# Patient Record
Sex: Female | Born: 1950 | Race: Black or African American | Hispanic: No | Smoking: Never smoker
Health system: Southern US, Community
[De-identification: ages and names within clinical notes are randomized; demographics above are authoritative.]

## PROBLEM LIST (undated history)

## (undated) DIAGNOSIS — I10 Essential (primary) hypertension: Secondary | ICD-10-CM

## (undated) DIAGNOSIS — E7800 Pure hypercholesterolemia, unspecified: Secondary | ICD-10-CM

## (undated) DIAGNOSIS — E119 Type 2 diabetes mellitus without complications: Secondary | ICD-10-CM

## (undated) DIAGNOSIS — M1990 Unspecified osteoarthritis, unspecified site: Secondary | ICD-10-CM

## (undated) DIAGNOSIS — G4730 Sleep apnea, unspecified: Secondary | ICD-10-CM

## (undated) DIAGNOSIS — F32A Depression, unspecified: Secondary | ICD-10-CM

## (undated) DIAGNOSIS — Z9989 Dependence on other enabling machines and devices: Secondary | ICD-10-CM

## (undated) HISTORY — PX: COLONOSCOPY: SHX174

## (undated) HISTORY — PX: ABDOMINAL HYSTERECTOMY: SHX81

## (undated) HISTORY — PX: EYE SURGERY: SHX253

---

## 2018-05-17 ENCOUNTER — Emergency Department (HOSPITAL_COMMUNITY)
Admission: EM | Admit: 2018-05-17 | Discharge: 2018-05-18 | Disposition: A | Discharge status: Home / Self Care | Payer: Medicare HMO | Attending: Emergency Medicine | Admitting: Emergency Medicine

## 2018-05-17 ENCOUNTER — Encounter (HOSPITAL_COMMUNITY): Payer: Self-pay | Admitting: Emergency Medicine

## 2018-05-17 ENCOUNTER — Other Ambulatory Visit: Payer: Self-pay

## 2018-05-17 DIAGNOSIS — M4302 Spondylolysis, cervical region: Secondary | ICD-10-CM | POA: Diagnosis not present

## 2018-05-17 DIAGNOSIS — M5412 Radiculopathy, cervical region: Secondary | ICD-10-CM | POA: Insufficient documentation

## 2018-05-17 DIAGNOSIS — M542 Cervicalgia: Secondary | ICD-10-CM | POA: Diagnosis present

## 2018-05-17 DIAGNOSIS — E119 Type 2 diabetes mellitus without complications: Secondary | ICD-10-CM | POA: Diagnosis not present

## 2018-05-17 DIAGNOSIS — I10 Essential (primary) hypertension: Secondary | ICD-10-CM | POA: Insufficient documentation

## 2018-05-17 HISTORY — DX: Type 2 diabetes mellitus without complications: E11.9

## 2018-05-17 HISTORY — DX: Pure hypercholesterolemia, unspecified: E78.00

## 2018-05-17 HISTORY — DX: Essential (primary) hypertension: I10

## 2018-05-17 NOTE — ED Triage Notes (Signed)
Pt c/o pain in right shoulder and arm pain x's 2 weeks.  Pt has been seen at a Urgent Care for same and given muscle relaxer for same.  Pt st's it's not helping the pain

## 2018-05-18 LAB — CBG MONITORING, ED: Glucose-Capillary: 267 mg/dL — ABNORMAL HIGH (ref 70–99)

## 2018-05-18 MED ORDER — NAPROXEN 375 MG PO TABS: 375.0000 mg | ORAL_TABLET | Freq: Two times a day (BID) | ORAL | 0 refills | Status: DC

## 2018-05-18 MED ORDER — KETOROLAC TROMETHAMINE 60 MG/2ML IM SOLN: 60.0000 mg | Freq: Once | INTRAMUSCULAR | Status: DC

## 2018-05-18 MED ORDER — HYDROCODONE-ACETAMINOPHEN 5-325 MG PO TABS: 1.0000 | ORAL_TABLET | Freq: Four times a day (QID) | ORAL | 0 refills | Status: DC | PRN

## 2018-05-18 MED ORDER — NAPROXEN 250 MG PO TABS: 250.0000 mg | ORAL_TABLET | Freq: Two times a day (BID) | ORAL | 0 refills | Status: AC

## 2018-05-18 MED ORDER — DEXAMETHASONE 4 MG PO TABS
10.0000 mg | ORAL_TABLET | Freq: Once | ORAL | Status: AC
  Administered 2018-05-18: 10 mg via ORAL
  Filled 2018-05-18: qty 3

## 2018-05-18 MED ORDER — HYDROCODONE-ACETAMINOPHEN 5-325 MG PO TABS
2.0000 | ORAL_TABLET | Freq: Once | ORAL | Status: AC
  Administered 2018-05-18: 2 via ORAL
  Filled 2018-05-18: qty 2

## 2018-05-18 MED ORDER — KETOROLAC TROMETHAMINE 60 MG/2ML IM SOLN
30.0000 mg | Freq: Once | INTRAMUSCULAR | Status: AC
  Administered 2018-05-18: 30 mg via INTRAMUSCULAR
  Filled 2018-05-18: qty 2

## 2018-05-18 NOTE — ED Provider Notes (Signed)
[facility] EMERGENCY DEPARTMENT Provider Note   CSN: [id number] Arrival date & time: 05/17/18  2323    History   Chief Complaint Chief Complaint  Patient presents with  . Shoulder Pain    HPI Carla Stokes is a 68 y.o. female.     HPI 68 year old female with history of hypertension, high cholesterol, diabetes, here with shoulder pain and neck pain.  The patient states that over the last several days, she has had progressively worsening aching, throbbing, right paraspinal neck pain.  This pain is worse with any kind of movement and occasionally shoots towards her shoulder and upper arm.  She had plain films yesterday which showed cervical spondylolysis with facet hypertrophy.  She has been taking a muscle relaxant without any relief.  She describes the pain is a sharp, stabbing pain that is worse when she looks to the right.  She denies any numbness or weakness her arms or legs.  No associated chest pain or shortness of breath.  Denies any overt injuries, but has been watching someone's house and dogs, and lifting and moving more than usual.  No alleviating factors.  No other complaints.  Past Medical History:  Diagnosis Date  . Diabetes mellitus without complication (HCC)   . High cholesterol   . Hypertension     There are no active problems to display for this patient.   Past Surgical History:  Procedure Laterality Date  . ABDOMINAL HYSTERECTOMY       OB History   No obstetric history on file.      Home Medications    Prior to Admission medications   Medication Sig Start Date End Date Taking? Authorizing Provider  HYDROcodone-acetaminophen (NORCO/VICODIN) 5-325 MG tablet Take 1-2 tablets by mouth every 6 (six) hours as needed for moderate pain or severe pain. 05/18/18   Shaune Pollack, MD  naproxen (NAPROSYN) 250 MG tablet Take 1 tablet (250 mg total) by mouth 2 (two) times daily with a meal for 7 days. 05/18/18 05/25/18  Shaune Pollack, MD    Family History No family history on file.  Social History Social History   Tobacco Use  . Smoking status: Never Smoker  . Smokeless tobacco: Never Used  Substance Use Topics  . Alcohol use: Not Currently  . Drug use: Never     Allergies   Patient has no allergy information on record.   Review of Systems Review of Systems  Constitutional: Negative for chills and fever.  HENT: Negative for congestion, rhinorrhea and sore throat.   Eyes: Negative for visual disturbance.  Respiratory: Negative for cough, shortness of breath and wheezing.   Cardiovascular: Negative for chest pain and leg swelling.  Gastrointestinal: Negative for abdominal pain, diarrhea, nausea and vomiting.  Genitourinary: Negative for dysuria, flank pain, vaginal bleeding and vaginal discharge.  Musculoskeletal: Positive for arthralgias and neck pain.  Skin: Negative for rash.  Allergic/Immunologic: Negative for immunocompromised state.  Neurological: Negative for syncope and headaches.  Hematological: Does not bruise/bleed easily.  All other systems reviewed and are negative.    Physical Exam Updated Vital Signs BP (!) 157/72 (BP Location: Left Arm)   Pulse 64   Temp (!) 97.2 F (36.2 C) (Oral)   Resp 20   Ht 5\' 8"  (1.727 m)   Wt 103.4 kg   LMP  (Exact Date)   SpO2 98%   BMI 34.67 kg/m   Physical Exam Vitals signs and nursing note reviewed.  Constitutional:      General:  She is not in acute distress.    Appearance: She is well-developed.  HENT:     Head: Normocephalic and atraumatic.  Eyes:     Conjunctiva/sclera: Conjunctivae normal.  Neck:     Musculoskeletal: Neck supple.     Comments: Significant right paraspinal tenderness to palpation, with tenderness upon right sided Spurling test.  This reproduces pain down her shoulder.  No overt bony tenderness over the upper arm, shoulder, or clavicle.  Minimal midline tenderness. Cardiovascular:     Rate and Rhythm: Normal rate and regular  rhythm.     Heart sounds: Normal heart sounds. No murmur. No friction rub.  Pulmonary:     Effort: Pulmonary effort is normal. No respiratory distress.     Breath sounds: Normal breath sounds. No wheezing or rales.  Abdominal:     General: There is no distension.     Palpations: Abdomen is soft.     Tenderness: There is no abdominal tenderness.  Skin:    General: Skin is warm.     Capillary Refill: Capillary refill takes less than 2 seconds.  Neurological:     Mental Status: She is alert and oriented to person, place, and time.     Motor: No abnormal muscle tone.     Comments: Strength is 5 out of 5 bilateral upper and lower extremities.  Normal sensation to light touch bilateral upper and lower extremities.  Normal brachial and brachioradialis reflexes.      ED Treatments / Results  Labs (all labs ordered are listed, but only abnormal results are displayed) Labs Reviewed  CBG MONITORING, ED - Abnormal; Notable for the following components:      Result Value   Glucose-Capillary 267 (*)    All other components within normal limits    EKG EKG Interpretation  Date/Time:  Thursday May 17 2018 23:36:32 EDT Ventricular Rate:  56 PR Interval:  150 QRS Duration: 98 QT Interval:  436 QTC Calculation: 420 R Axis:   -22 Text Interpretation:  Sinus bradycardia Non-specific ST-t changes Baseline wander Abnormal ECG Confirmed by Shaune Pollack [phone]) on 05/18/2018 4:33:00 AM   Radiology No results found.  Procedures Procedures (including critical care time)  Medications Ordered in ED Medications  HYDROcodone-acetaminophen (NORCO/VICODIN) 5-325 MG per tablet 2 tablet (2 tablets Oral Given 05/18/18 0347)  dexamethasone (DECADRON) tablet 10 mg (10 mg Oral Given 05/18/18 0347)  ketorolac (TORADOL) injection 30 mg (30 mg Intramuscular Given 05/18/18 0358)     Initial Impression / Assessment and Plan / ED Course  I have reviewed the triage vital signs and the nursing notes.   Pertinent labs & imaging results that were available during my care of the patient were reviewed by me and considered in my medical decision making (see chart for details).        68 year old female here with right paraspinal neck pain shooting towards her shoulder.  This is completely reproducible on examination.  She has no upper extremity numbness, weakness, or symptoms to suggest cord compression or acute neurological compromise.  Plain films from recent visit reviewed in paper, and show spondylolysis with degenerative changes and foraminal stenosis in C4-C6 distribution, which is consistent with her radicular symptoms.  She adamantly denies any anterior chest pain, shortness of breath, and EKG, while it does show nonspecific T wave changes, appears unchanged from multiple previous explained EKGs and review of care everywhere.  She has resolution of symptoms with rest and analgesics.  I suspect symptoms are due to cervical  radiculopathy in the setting of increased physical activity.  Will give a single dose of steroid, as well as course of analgesics and anti-inflammatories.  Will have her hold Robaxin.  Final Clinical Impressions(s) / ED Diagnoses   Final diagnoses:  Cervical radiculopathy  Cervical spondylolysis    ED Discharge Orders         Ordered    HYDROcodone-acetaminophen (NORCO/VICODIN) 5-325 MG tablet  Every 6 hours PRN,   Status:  Discontinued     05/18/18 0435    naproxen (NAPROSYN) 375 MG tablet  2 times daily with meals,   Status:  Discontinued     05/18/18 0435    naproxen (NAPROSYN) 250 MG tablet  2 times daily with meals     05/18/18 0438    HYDROcodone-acetaminophen (NORCO/VICODIN) 5-325 MG tablet  Every 6 hours PRN     05/18/18 0438           Shaune Pollack, MD 05/18/18 [phone]

## 2018-05-18 NOTE — Discharge Instructions (Signed)
I would recommend stopping the methocarbamol, as this may not improve your symptoms and could sedate you.  Take the prescribed medications.  I recommend discussing with your primary as you may benefit from referral to a spine specialist in the next 1 to 2 weeks if symptoms not improve.  No heavy lifting over 10 pounds for the next week.

## 2018-05-18 NOTE — ED Notes (Signed)
EDP at bedside  

## 2021-04-01 ENCOUNTER — Emergency Department (HOSPITAL_COMMUNITY): Payer: Medicare HMO

## 2021-04-01 ENCOUNTER — Emergency Department (HOSPITAL_COMMUNITY)
Admission: EM | Admit: 2021-04-01 | Discharge: 2021-04-01 | Disposition: A | Discharge status: Home / Self Care | Payer: Medicare HMO | Attending: Emergency Medicine | Admitting: Emergency Medicine

## 2021-04-01 ENCOUNTER — Encounter (HOSPITAL_COMMUNITY): Payer: Self-pay

## 2021-04-01 ENCOUNTER — Other Ambulatory Visit: Payer: Self-pay

## 2021-04-01 DIAGNOSIS — S8992XA Unspecified injury of left lower leg, initial encounter: Secondary | ICD-10-CM | POA: Diagnosis present

## 2021-04-01 DIAGNOSIS — I10 Essential (primary) hypertension: Secondary | ICD-10-CM | POA: Diagnosis not present

## 2021-04-01 DIAGNOSIS — E119 Type 2 diabetes mellitus without complications: Secondary | ICD-10-CM | POA: Diagnosis not present

## 2021-04-01 DIAGNOSIS — X58XXXA Exposure to other specified factors, initial encounter: Secondary | ICD-10-CM | POA: Diagnosis not present

## 2021-04-01 DIAGNOSIS — R030 Elevated blood-pressure reading, without diagnosis of hypertension: Secondary | ICD-10-CM | POA: Diagnosis not present

## 2021-04-01 DIAGNOSIS — S81802A Unspecified open wound, left lower leg, initial encounter: Secondary | ICD-10-CM

## 2021-04-01 LAB — COMPREHENSIVE METABOLIC PANEL
ALT: 47 U/L — ABNORMAL HIGH (ref 0–44)
AST: 49 U/L — ABNORMAL HIGH (ref 15–41)
Albumin: 3.9 g/dL (ref 3.5–5.0)
Alkaline Phosphatase: 54 U/L (ref 38–126)
Anion gap: 10 (ref 5–15)
BUN: 10 mg/dL (ref 8–23)
CO2: 27 mmol/L (ref 22–32)
Calcium: 9.6 mg/dL (ref 8.9–10.3)
Chloride: 105 mmol/L (ref 98–111)
Creatinine, Ser: 0.86 mg/dL (ref 0.44–1.00)
GFR, Estimated: >60 mL/min (ref >60)
Glucose, Bld: 157 mg/dL — ABNORMAL HIGH (ref 70–99)
Potassium: 3.6 mmol/L (ref 3.5–5.1)
Sodium: 142 mmol/L (ref 135–145)
Total Bilirubin: 0.6 mg/dL (ref 0.3–1.2)
Total Protein: 7 g/dL (ref 6.5–8.1)

## 2021-04-01 LAB — CBG MONITORING, ED: Glucose-Capillary: 137 mg/dL — ABNORMAL HIGH (ref 70–99)

## 2021-04-01 LAB — CBC WITH DIFFERENTIAL/PLATELET
Abs Immature Granulocytes: 0.03 10*3/uL (ref 0.00–0.07)
Basophils Absolute: 0 10*3/uL (ref 0.0–0.1)
Basophils Relative: 1 %
Eosinophils Absolute: 0.1 10*3/uL (ref 0.0–0.5)
Eosinophils Relative: 1 %
HCT: 41.3 % (ref 36.0–46.0)
Hemoglobin: 14 g/dL (ref 12.0–15.0)
Immature Granulocytes: 1 %
Lymphocytes Relative: 21 %
Lymphs Abs: 1.3 10*3/uL (ref 0.7–4.0)
MCH: 26.2 pg (ref 26.0–34.0)
MCHC: 33.9 g/dL (ref 30.0–36.0)
MCV: 77.3 fL — ABNORMAL LOW (ref 80.0–100.0)
Monocytes Absolute: 0.4 10*3/uL (ref 0.1–1.0)
Monocytes Relative: 6 %
Neutro Abs: 4.4 10*3/uL (ref 1.7–7.7)
Neutrophils Relative %: 70 %
Platelets: 207 10*3/uL (ref 150–400)
RBC: 5.34 MIL/uL — ABNORMAL HIGH (ref 3.87–5.11)
RDW: 14 % (ref 11.5–15.5)
WBC: 6.2 10*3/uL (ref 4.0–10.5)
nRBC: 0 % (ref 0.0–0.2)

## 2021-04-01 MED ORDER — DOXYCYCLINE HYCLATE 100 MG PO CAPS: 100.0000 mg | ORAL_CAPSULE | Freq: Two times a day (BID) | ORAL | 0 refills | Status: AC

## 2021-04-01 MED ORDER — DOXYCYCLINE HYCLATE 100 MG PO TABS
100.0000 mg | ORAL_TABLET | Freq: Once | ORAL | Status: AC
  Administered 2021-04-01: 100 mg via ORAL
  Filled 2021-04-01: qty 1

## 2021-04-01 NOTE — Discharge Instructions (Addendum)
At this time there does not appear to be the presence of an emergent medical condition, however there is always the potential for conditions to change. Please read and follow the below instructions.  Please return to the Emergency Department immediately for any new or worsening symptoms. Please be sure to follow up with your Primary Care Provider within one week regarding your visit today; please call their office to schedule an appointment even if you are feeling better for a follow-up visit. Please go to your appointment at the wound care center for further evaluation and treatment of your left leg wound.  Please continue good wound care as discussed by your podiatrist, change her dressing every 2 days. Please take your antibiotic doxycycline as prescribed until complete to help with your symptoms.  Please drink enough water to avoid dehydration and get plenty of rest. Your blood pressure was elevated in the emergency department today.  Please be sure to take your blood pressure medication today, please have it rechecked by your primary care provider at your follow-up visit this week to ensure its improvement  Go to the nearest Emergency Department immediately if: You have fever or chills Get a bad headache. Start to feel mixed up (confused). Feel weak or numb. Feel faint. Have very bad pain in your: Chest. Belly (abdomen). Throw up more than once. Have trouble breathing. Your pain is not controlled with medicine. You have any of these signs of infection: More redness, swelling, or pain around the wound. Fluid or blood coming from the wound. Warmth coming from the wound. A fever or chills. You are nauseous or you vomit. You are dizzy. You have a new rash or hardness around the wound. You have any new/concerning or worsening of symptoms.   Please read the additional information packets attached to your discharge summary.  Do not take your medicine if  develop an itchy rash, swelling  in your mouth or lips, or difficulty breathing; call 911 and seek immediate emergency medical attention if this occurs.  You may review your lab tests and imaging results in their entirety on your MyChart account.  Please discuss all results of fully with your primary care provider and other specialist at your follow-up visit.  Note: Portions of this text may have been transcribed using voice recognition software. Every effort was made to ensure accuracy; however, inadvertent computerized transcription errors may still be present.

## 2021-04-01 NOTE — ED Provider Triage Note (Addendum)
Emergency Medicine Provider Triage Evaluation Note  Carla Stokes , a 71 y.o. female with a history of diabetes mellitus type 2 was evaluated in triage.  Pt complains of on the left lateral ankle for the last 4 to 5 weeks.  Has history of diabetes.  Wound is painful.  Has tried courses of 2 different antibiotics without success, but cannot remember their names.  Denies fever.  Review of Systems  Positive: Wound of left ankle Negative: Fever  Physical Exam  BP (!) 184/90 (BP Location: Left Arm)    Pulse 65    Temp 99.1 F (37.3 C) (Oral)    Resp 18    Ht 5\' 7"  (1.702 m)    Wt 105.7 kg    SpO2 100%    BMI 36.49 kg/m  Gen:   Awake, no distress   Resp:  Normal effort  MSK:   Moves extremities without difficulty  Other:  Ulceration of left lateral lower extremity proximal to the malleolus.  Dressing present.  Weeping through dressing.  Extreme TTP of area + ascending mild TTP  Medical Decision Making  Medically screening exam initiated at 11:36 AM.  Appropriate orders placed.  was informed that the remainder of the evaluation will be completed by another provider, this initial triage assessment does not replace that evaluation, and the importance of remaining in the ED until their evaluation is complete.  Imaging and labs   Micheal Likens 04/01/21 1152    05/30/21, Cecil Cobbs 04/01/21 1153

## 2021-04-01 NOTE — ED Triage Notes (Signed)
Pt arrived POV from home c/o a wound on her left ankle. Pt states she is a diabetic and she has been dealing with it for 4-5 weeks. She has been on 2 antibiotics and it is only getting worse. Wound dr told her to come over.

## 2021-04-01 NOTE — ED Provider Notes (Signed)
[facility] EMERGENCY DEPARTMENT Provider Note   CSN: [id number] Arrival date & time: 04/01/21  1058     History  Chief Complaint  Patient presents with   Leg Pain    Carla Stokes is a 71 y.o. female history includes diabetes, hypertension, hyperlipidemia.  Patient presents to the ED today for evaluation of left lower leg wound, patient has been dealing with this wound for several weeks.  Patient reports that she has been treated by a podiatrist for this and has had 2 rounds of antibiotics, she reports that they have not helped and that podiatry sent her to [facility] for further treatment and wound care.  Patient reports a mild aching pain to the left lower leg worsened with palpation, improves somewhat with rest, patient denies radiation of pain.  Patient denies fall/injury, fever, chills, numbness/tingling, headache, chest pain, abdominal pain or any additional concerns.  HPI     Home Medications Prior to Admission medications   Medication Sig Start Date End Date Taking? Authorizing Provider  doxycycline (VIBRAMYCIN) 100 MG capsule Take 1 capsule (100 mg total) by mouth 2 (two) times daily for 10 days. 04/01/21 04/11/21 Yes Harlene Salts A, PA-C  HYDROcodone-acetaminophen (NORCO/VICODIN) 5-325 MG tablet Take 1-2 tablets by mouth every 6 (six) hours as needed for moderate pain or severe pain. 05/18/18   Shaune Pollack, MD      Allergies    Patient has no known allergies.    Review of Systems   Review of Systems Ten systems are reviewed and are negative for acute change except as noted in the HPI  Physical Exam Updated Vital Signs BP (!) 184/90 (BP Location: Left Arm)    Pulse 65    Temp 99.1 F (37.3 C) (Oral)    Resp 18    Ht 5\' 7"  (1.702 m)    Wt 105.7 kg    SpO2 100%    BMI 36.49 kg/m  Physical Exam Constitutional:      General: She is not in acute distress.    Appearance: Normal appearance. She is well-developed. She is not ill-appearing or  diaphoretic.  HENT:     Head: Normocephalic and atraumatic.  Eyes:     General: Vision grossly intact. Gaze aligned appropriately.     Pupils: Pupils are equal, round, and reactive to light.  Neck:     Trachea: Trachea and phonation normal.  Cardiovascular:     Pulses:          Dorsalis pedis pulses are 1+ on the right side and 1+ on the left side.  Pulmonary:     Effort: Pulmonary effort is normal. No respiratory distress.  Abdominal:     General: There is no distension.     Palpations: Abdomen is soft.     Tenderness: There is no abdominal tenderness. There is no guarding or rebound.  Musculoskeletal:        General: Normal range of motion.     Cervical back: Normal range of motion.  Skin:    General: Skin is warm and dry.     Comments: 1 cm area of ulceration to the lateral side of the left lower leg with mild surrounding erythema, scant serosanguineous drainage.  No streaking, no fluctuance.  Please see picture below.  Neurological:     Mental Status: She is alert.     GCS: GCS eye subscore is 4. GCS verbal subscore is 5. GCS motor subscore is 6.     Comments:  Speech is clear and goal oriented, follows commands Major Cranial nerves without deficit, no facial droop Moves extremities without ataxia, coordination intact  Psychiatric:        Behavior: Behavior normal.     ED Results / Procedures / Treatments   Labs (all labs ordered are listed, but only abnormal results are displayed) Labs Reviewed  COMPREHENSIVE METABOLIC PANEL - Abnormal; Notable for the following components:      Result Value   Glucose, Bld 157 (*)    AST 49 (*)    ALT 47 (*)    All other components within normal limits  CBC WITH DIFFERENTIAL/PLATELET - Abnormal; Notable for the following components:   RBC 5.34 (*)    MCV 77.3 (*)    All other components within normal limits  CBG MONITORING, ED - Abnormal; Notable for the following components:   Glucose-Capillary 137 (*)    All other components  within normal limits  CULTURE, BLOOD (ROUTINE X 2)  CULTURE, BLOOD (ROUTINE X 2)    EKG None  Radiology DG Tibia/Fibula Left  Result Date: 04/01/2021 CLINICAL DATA:  Possible osteomyelitis. EXAM: LEFT TIBIA AND FIBULA - 2 VIEW COMPARISON:  November 19, 2014. FINDINGS: There is no evidence of fracture or other focal bone lesions. Vascular calcifications are noted. IMPRESSION: No definite bony abnormality is noted. Electronically Signed   By: Lupita Raider M.D.   On: 04/01/2021 12:20    Procedures Procedures    Medications Ordered in ED Medications  doxycycline (VIBRA-TABS) tablet 100 mg (has no administration in time range)    ED Course/ Medical Decision Making/ A&P Clinical Course as of 04/01/21 1455  Thu Apr 01, 2021  4274 71 year old female here for evaluation of a wound on her left lower leg.  She was just at her podiatrist office.  Otherwise nontoxic-appearing and reassuring labs.  We will reach out to podiatry to see if they have any other further recommendations but likely can be treated as outpatient [MB]  1448 CBC with Differential(!) No leukocytosis, no anemia or thrombocytopenia [BM]  1448 Comprehensive metabolic panel(!) No emergent Electra derangement, AKI or gap.  Mild elevation of AST and ALT at 49 and 47, no priors to compare.  Hyperglycemia 157.  No evidence for DKA. [BM]  1449 Culture, blood (routine x 2) In process [BM]  1449 CBG monitoring, ED(!) Noted [BM]  1453 DG Tibia/Fibula Left I personally reviewed patient's left tib-fib x-ray, agree with radiologist interpretation that there are no acute osseous findings. [BM]    Clinical Course User Index [BM] Bill Salinas, PA-C [MB] Terrilee Files, MD                           Medical Decision Making Carla Stokes is a 71 y.o. female history includes diabetes, hypertension, hyperlipidemia.  Patient presents to the ED today for evaluation of left lower leg wound, patient has been dealing with  this wound for several weeks.  Patient reports that she has been treated by a podiatrist for this and has had 2 rounds of antibiotics, she reports that they have not helped and that podiatry sent her to [facility] for further treatment and wound care.  Patient reports a mild aching pain to the left lower leg worsened with palpation, improves somewhat with rest, patient denies radiation of pain.  Patient denies fall/injury, fever, chills, numbness/tingling, headache, chest pain, abdominal pain or any additional concerns. --- Reviewed patient's chart  it appears she was started on a course of Keflex at the beginning of January, this was changed to Augmentin later on the month.  Amount and/or Complexity of Data Reviewed Independent Historian: caregiver    Details: Patient's podiatrist Dr. Kaylyn Layerilles. External Data Reviewed: notes.    Details: Podiatry notes Labs:  Decision-making details documented in ED Course. Radiology:  Decision-making details documented in ED Course. Discussion of management or test interpretation with external provider(s): Dr. Kaylyn Layerilles Podiatry  Risk Prescription drug management.  -------- Patient's work-up today is overall reassuring, vital signs are stable.  Low suspicion for sepsis, osteomyelitis or other emergent medical conditions.  I called patient's podiatrist Dr. Kaylyn Layerilles, there appears to be some miscommunication, patient thought she was to [facility] for further treatment of her wound, instead she was to go to the wound care center at [facility] at her appointment next week.  I discussed the case with Dr. Kaylyn Layerilles, advises starting patient on a course of doxycycline 100 mg twice daily and then having patient follow good wound care and see the wound care center for further treatment at her appointment next week.  I reassessed the patient she is resting company bed, no acute distress, patient states understanding of care plan above she is agreeable to discharge with  doxycycline and follow-up with wound care center.  She has no additional questions or concerns.  Of note patient was noted to be hypertensive in triage today, patient did not take her blood pressure medications this morning, she has medications with her.  She plans to take the medications when she leaves the ER, she denies symptoms suggestive of hypertensive urgency/emergency.  I asked the patient to have blood pressure rechecked by PCP this week.  Patient informed of signs or symptoms of hypertensive urgency/emergency and to return to the ER if they occur, we also discussed signs and symptoms of worsening infection and to return if they occur as well.  Patient reports her daughter is coming to pick her up.  At this time there does not appear to be any evidence of an acute emergency medical condition and the patient appears stable for discharge with appropriate outpatient follow up. Diagnosis was discussed with patient who verbalizes understanding of care plan and is agreeable to discharge. I have discussed return precautions with patient who verbalizes understanding. Patient encouraged to follow-up with their PCP within and wound care. All questions answered.  Note: Portions of this report may have been transcribed using voice recognition software. Every effort was made to ensure accuracy; however, inadvertent computerized transcription errors may still be present.         Final Clinical Impression(s) / ED Diagnoses Final diagnoses:  Wound of left lower extremity, initial encounter  Elevated blood pressure reading    Rx / DC Orders ED Discharge Orders          Ordered    doxycycline (VIBRAMYCIN) 100 MG capsule  2 times daily        04/01/21 1450              Elizabeth PalauMorelli, Ulric Salzman A, PA-C 04/01/21 1456    Terrilee FilesButler, Michael C, MD 04/01/21 2026

## 2021-04-06 LAB — CULTURE, BLOOD (ROUTINE X 2)
Culture: NO GROWTH
Special Requests: ADEQUATE

## 2021-04-07 ENCOUNTER — Other Ambulatory Visit: Payer: Self-pay

## 2021-04-07 ENCOUNTER — Encounter (HOSPITAL_BASED_OUTPATIENT_CLINIC_OR_DEPARTMENT_OTHER): Payer: Medicare HMO | Attending: Internal Medicine | Admitting: Internal Medicine

## 2021-04-07 DIAGNOSIS — I10 Essential (primary) hypertension: Secondary | ICD-10-CM | POA: Insufficient documentation

## 2021-04-07 DIAGNOSIS — L97828 Non-pressure chronic ulcer of other part of left lower leg with other specified severity: Secondary | ICD-10-CM | POA: Insufficient documentation

## 2021-04-07 DIAGNOSIS — E11621 Type 2 diabetes mellitus with foot ulcer: Secondary | ICD-10-CM | POA: Diagnosis not present

## 2021-04-07 DIAGNOSIS — M109 Gout, unspecified: Secondary | ICD-10-CM | POA: Insufficient documentation

## 2021-04-07 DIAGNOSIS — E1151 Type 2 diabetes mellitus with diabetic peripheral angiopathy without gangrene: Secondary | ICD-10-CM | POA: Insufficient documentation

## 2021-04-07 DIAGNOSIS — S80812A Abrasion, left lower leg, initial encounter: Secondary | ICD-10-CM | POA: Insufficient documentation

## 2021-04-07 DIAGNOSIS — X58XXXA Exposure to other specified factors, initial encounter: Secondary | ICD-10-CM | POA: Diagnosis not present

## 2021-04-07 DIAGNOSIS — E7800 Pure hypercholesterolemia, unspecified: Secondary | ICD-10-CM | POA: Insufficient documentation

## 2021-04-07 DIAGNOSIS — I87332 Chronic venous hypertension (idiopathic) with ulcer and inflammation of left lower extremity: Secondary | ICD-10-CM | POA: Diagnosis not present

## 2021-04-07 NOTE — Progress Notes (Signed)
Carla Stokes, Carla Stokes ([id number]) Visit Report for 04/07/2021 Chief Complaint Document Details Patient Name: Date of Service: Carla [place], [name]. 04/07/2021 8:00 A M Medical Record Number: [id number] Patient Account Number: [id number] Date of Birth/Sex: Treating RN: 05/06/1950 (71 y.o. Tommye Standard Primary Care Provider: DO Luvenia Redden, RO BERT Other Clinician: Referring Provider: Treating Provider/Extender: Fransico Meadow, RO BERT Weeks in Treatment: 0 Information Obtained from: Patient Chief Complaint 04/07/2021; patient is here for review of wound on the left lateral lower leg Electronic Signature(s) Signed: 04/07/2021 4:37:10 PM By: Baltazar Najjar MD Entered By: Baltazar Najjar on 04/07/2021 09:15:57 -------------------------------------------------------------------------------- Debridement Details Patient Name: Date of Service: Carla [name], [name]. 04/07/2021 8:00 A M Medical Record Number: [id number] Patient Account Number: [id number] Date of Birth/Sex: Treating RN: [birth year] (71 y.o. Tommye Standard Primary Care Provider: DO Luvenia Redden, RO BERT Other Clinician: Referring Provider: Treating Provider/Extender: Fransico Meadow, RO BERT Weeks in Treatment: 0 Debridement Performed for Assessment: Wound #1 Left,Lateral Lower Leg Performed By: Physician Maxwell Caul., MD Debridement Type: Debridement Severity of Tissue Pre Debridement: Fat layer exposed Level of Consciousness (Pre-procedure): Awake and Alert Pre-procedure Verification/Time Out Yes - 08:55 Taken: Start Time: 08:55 Pain Control: Lidocaine 4% T opical Solution T Area Debrided (L x W): otal 2.5 (cm) x 1.5 (cm) = 3.75 (cm) Tissue and other material debrided: Viable, Non-Viable, Slough, Subcutaneous, Slough Level: Skin/Subcutaneous Tissue Debridement Description: Excisional Instrument: Curette Bleeding: Minimum Hemostasis Achieved: Pressure Procedural Pain: 7 Post Procedural Pain: 4 Response to  Treatment: Procedure was tolerated well Level of Consciousness (Post- Awake and Alert procedure): Post Debridement Measurements of Total Wound Length: (cm) 2.5 Width: (cm) 1.5 Depth: (cm) 0.1 Volume: (cm) 0.295 Character of Wound/Ulcer Post Debridement: Improved Severity of Tissue Post Debridement: Fat layer exposed Post Procedure Diagnosis Same as Pre-procedure Electronic Signature(s) Signed: 04/07/2021 4:36:42 PM By: Zenaida Deed RN, BSN Signed: 04/07/2021 4:37:10 PM By: Baltazar Najjar MD Entered By: Baltazar Najjar on 04/07/2021 09:14:47 -------------------------------------------------------------------------------- HPI Details Patient Name: Date of Service: Carla [name], [name]. 04/07/2021 8:00 A M Medical Record Number: [id number] Patient Account Number: [id number] Date of Birth/Sex: Treating RN: 1950-08-14 (72 y.o. Tommye Standard Primary Care Provider: DO Luvenia Redden, RO BERT Other Clinician: Referring Provider: Treating Provider/Extender: Fransico Meadow, RO BERT Weeks in Treatment: 0 History of Present Illness HPI Description: ADMISSION 04/07/2021 This is a 71 year old woman with type 2 diabetes. She states her problem began towards the end of November when she had a fall winter in the yard and injured her left lower leg. She has [facility]. Saw them on 03/02/2021 she was felt to have cellulitis of the left ankle and the left leg wound. She ultimately was felt to have a venous stasis ulcer and I believe was put in an [employer]. She is not wearing that currently. She had several further visits. The wound became increasingly painful and on 04/01/2021 she went to the emergency room. An x-ray of the area showed vascular calcification but no bony abnormality. She was put on doxycycline which she is still taking. She does not have a history of prior wounds or venous insufficiency that she knows about. Past medical history includes type 2  diabetes with a A1c of 7.5 in October, hypertension, gout, hypercholesterolemia, diabetic foot ulcer and a history of PAD The patient follows with vascular surgery in [place]. He was able to see that she has previously had vascular evaluation  most recently in March 2022. Although I do not have the exact numbers it is quoted in their last note it is showing noncompressible ABIs but normal TBI's and normal waveforms. As such there should not be any difficulty with sufficient blood flow to heal this wound. Electronic Signature(s) Signed: 04/07/2021 4:37:10 PM By: Baltazar Najjar MD Entered By: Baltazar Najjar on 04/07/2021 09:18:43 -------------------------------------------------------------------------------- Physical Exam Details Patient Name: Date of Service: Carla [name], [name]. 04/07/2021 8:00 A M Medical Record Number: [id number] Patient Account Number: [id number] Date of Birth/Sex: Treating RN: 1950/12/11 (71 y.o. Tommye Standard Primary Care Provider: DO Luvenia Redden, RO BERT Other Clinician: Referring Provider: Treating Provider/Extender: Baltazar Najjar DO UGH, RO BERT Weeks in Treatment: 0 Constitutional Patient is hypertensive.. Pulse regular and within target range for patient.Marland Kitchen Respirations regular, non-labored and within target range.. Temperature is normal and within the target range for the patient.Marland Kitchen Appears in no distress. Cardiovascular Pedal pulses are palpable at the dorsalis pedis and posterior tibial. Edema present in both extremities.. 2+. No evidence of a cellulitis or DVT.Marland Kitchen Integumentary (Hair, Skin) She has some erythema around the wound but this is nontender. Notes Wound exam; left lateral lower leg. 100% covered in fibrinous adherent debris. She had a small satellite lesion superior to the wound. This is very difficult to debride. The debris is very adherent. Although she has some erythema around the wound this is nontender and does not seem to represent cellulitis to  me. I suspect she has chronic venous insufficiency Electronic Signature(s) Signed: 04/07/2021 4:37:10 PM By: Baltazar Najjar MD Entered By: Baltazar Najjar on 04/07/2021 09:20:13 -------------------------------------------------------------------------------- Physician Orders Details Patient Name: Date of Service: Carla [name], [name]. 04/07/2021 8:00 A M Medical Record Number: [id number] Patient Account Number: [id number] Date of Birth/Sex: Treating RN: 05/09/1950 (71 y.o. Tommye Standard Primary Care Provider: DO Luvenia Redden, RO BERT Other Clinician: Referring Provider: Treating Provider/Extender: Fransico Meadow, RO BERT Weeks in Treatment: 0 Verbal / Phone Orders: No Diagnosis Coding Follow-up Appointments ppointment in 1 week. - Dr. Leanord Hawking - room 7 Return A Bathing/ Shower/ Hygiene May shower with protection but do not get wound dressing(s) wet. Edema Control - Lymphedema / SCD / Other Left Lower Extremity Elevate legs to the level of the heart or above for 30 minutes daily and/or when sitting, a frequency of: - throughout the day Avoid standing for long periods of time. Exercise regularly Wound Treatment Wound #1 - Lower Leg Wound Laterality: Left, Lateral Peri-Wound Care: Triamcinolone 15 (g) 1 x Per Week Discharge Instructions: Use triamcinolone 15 (g) to periwound Peri-Wound Care: Sween Lotion (Moisturizing lotion) 1 x Per Week Discharge Instructions: Apply moisturizing lotion as directed Prim Dressing: IODOFLEX 0.9% Cadexomer Iodine Pad 4x6 cm 1 x Per Week ary Discharge Instructions: Apply to wound bed as instructed Secondary Dressing: Woven Gauze Sponge, Non-Sterile 4x4 in 1 x Per Week Discharge Instructions: Apply over primary dressing as directed. Compression Wrap: ThreePress (3 layer compression wrap) 1 x Per Week Discharge Instructions: Apply three layer compression as directed. Patient Medications llergies: codeine, Farxiga A Notifications Medication Indication  Start End prior to debridement 04/07/2021 lidocaine DOSE topical 4 % cream - cream topical Electronic Signature(s) Signed: 04/07/2021 4:36:42 PM By: Zenaida Deed RN, BSN Signed: 04/07/2021 4:37:10 PM By: Baltazar Najjar MD Entered By: Zenaida Deed on 04/07/2021 09:02:59 -------------------------------------------------------------------------------- Problem List Details Patient Name: Date of Service: Carla [name], [name]. 04/07/2021 8:00 A M Medical Record Number: [id number] Patient Account Number: [id number] Date of  Birth/Sex: Treating RN: 07/07/1950 [age] y.o. Tommye StandardF) Boehlein, Carla Stokes Primary Care Provider: DO UGH, RO BERT Other Clinician: Referring Provider: Treating Provider/Extender: Fransico Meadowobson, Chace Klippel DO UGH, RO BERT Weeks in Treatment: 0 Active Problems ICD-10 Encounter Code Description Active Date MDM Diagnosis S80.812D Abrasion, left lower leg, subsequent encounter 04/07/2021 No Yes L97.828 Non-pressure chronic ulcer of other part of left lower leg with other specified 04/07/2021 No Yes severity I87.332 Chronic venous hypertension (idiopathic) with ulcer and inflammation of left 04/07/2021 No Yes lower extremity Inactive Problems Resolved Problems Electronic Signature(s) Signed: 04/07/2021 4:37:10 PM By: Baltazar Najjarobson, Teshaun Olarte MD Entered By: Baltazar Najjarobson, Tavaughn Silguero on 04/07/2021 09:14:19 -------------------------------------------------------------------------------- Progress Note Details Patient Name: Date of Service: Carla Stokes, Carla Stokes NDA C. 04/07/2021 8:00 A M Medical Record Number: [id number] Patient Account Number: [id number] Date of Birth/Sex: Treating RN: 06/06/1950 [age] y.o. Tommye StandardF) Boehlein, Carla Stokes Primary Care Provider: DO Luvenia ReddenUGH, RO BERT Other Clinician: Referring Provider: Treating Provider/Extender: Fransico Meadowobson, Mordche Hedglin DO UGH, RO BERT Weeks in Treatment: 0 Subjective Chief Complaint Information obtained from Patient 04/07/2021; patient is here for review of wound on the left lateral lower leg History of  Present Illness (HPI) ADMISSION 04/07/2021 This is a 71 year old woman with type 2 diabetes. She states her problem began towards the end of November when she had a fall winter in the yard and injured her left lower leg. [facility] has [facility]. Saw them on 03/02/2021 she was felt to have cellulitis of the left ankle and the left leg wound. She ultimately was felt to have a venous stasis ulcer and I believe was put in an [employer] boot. She is not wearing that currently. She had several further visits. The wound became increasingly painful and on 04/01/2021 she went to the emergency room. An x-ray of the area showed vascular calcification but no bony abnormality. She was put on doxycycline which she is still taking. She does not have a history of prior wounds or venous insufficiency that she knows about. Past medical history includes type 2 diabetes with a A1c of 7.5 in October, hypertension, gout, hypercholesterolemia, diabetic foot ulcer and a history of PAD The patient follows with vascular surgery in [place]. He was able to see that she has previously had vascular evaluation most recently in March 2022. Although I do not have the exact numbers it is quoted in their last note it is showing noncompressible ABIs but normal TBI's and normal waveforms. As such there should not be any difficulty with sufficient blood flow to heal this wound. Patient History Information obtained from Patient, Chart. Allergies codeine (Severity: Moderate, Reaction: dizziness), Farxiga (Reaction: yeast infection) Family History Cancer - Siblings,Father, Diabetes - Siblings, Heart Disease - Siblings,Mother, Hypertension - Siblings,Mother, Lung Disease - Siblings, Stroke - Siblings,Mother, Thyroid Problems - Siblings, No family history of Hereditary Spherocytosis, Kidney Disease, Seizures, Tuberculosis. Social History Never smoker, Marital Status - Divorced, Alcohol Use - Rarely, Drug Use -  No History, Caffeine Use - Moderate - coffee. Medical History Eyes Patient has history of Cataracts - left eye removed Denies history of Glaucoma Cardiovascular Patient has history of Hypertension Endocrine Patient has history of Type II Diabetes Denies history of Type I Diabetes Integumentary (Skin) Denies history of History of Burn Musculoskeletal Patient has history of Osteoarthritis Denies history of Gout, Rheumatoid Arthritis, Osteomyelitis Neurologic Patient has history of Neuropathy Oncologic Denies history of Received Chemotherapy, Received Radiation Psychiatric Denies history of Anorexia/bulimia, Confinement Anxiety Patient is treated with Insulin, Oral Agents. Blood sugar is  tested. Hospitalization/Surgery History - left cataract extraction. - c-section. - total abdominal hysterectomy w/ bil salpingoophrectomy. - wisdom tooth extraction. Medical A Surgical History Notes nd Constitutional Symptoms (General Health) obesity Cardiovascular hyperlipidemia Review of Systems (ROS) Constitutional Symptoms (General Health) Denies complaints or symptoms of Fatigue, Fever, Chills, Marked Weight Change. Eyes Denies complaints or symptoms of Dry Eyes, Vision Changes, Glasses / Contacts. Ear/Nose/Mouth/Throat Denies complaints or symptoms of Chronic sinus problems or rhinitis. Respiratory Denies complaints or symptoms of Chronic or frequent coughs, Shortness of Breath. Gastrointestinal Denies complaints or symptoms of Frequent diarrhea, Nausea, Vomiting. Endocrine Denies complaints or symptoms of Heat/cold intolerance. Genitourinary Denies complaints or symptoms of Frequent urination. Integumentary (Skin) Complains or has symptoms of Wounds - left lower leg. Musculoskeletal Complains or has symptoms of Muscle Pain. Denies complaints or symptoms of Muscle Weakness. Neurologic Complains or has symptoms of Numbness/parasthesias - feet. Psychiatric Denies complaints or  symptoms of Claustrophobia, Suicidal. Objective Constitutional Patient is hypertensive.. Pulse regular and within target range for patient.Marland Kitchen Respirations regular, non-labored and within target range.. Temperature is normal and within the target range for the patient.Marland Kitchen Appears in no distress. Vitals Time Taken: 8:12 AM, Height: 67 in, Source: Stated, Temperature: 98.8 F, Pulse: 60 bpm, Respiratory Rate: 18 breaths/min, Blood Pressure: 175/78 mmHg, Capillary Blood Glucose: 125 mg/dl. General Notes: glucose per pt report last night Cardiovascular Pedal pulses are palpable at the dorsalis pedis and posterior tibial. Edema present in both extremities.. 2+. No evidence of a cellulitis or DVT.Marland Kitchen General Notes: Wound exam; left lateral lower leg. 100% covered in fibrinous adherent debris. She had a small satellite lesion superior to the wound. This is very difficult to debride. The debris is very adherent. Although she has some erythema around the wound this is nontender and does not seem to represent cellulitis to me. I suspect she has chronic venous insufficiency Integumentary (Hair, Skin) She has some erythema around the wound but this is nontender. Wound #1 status is Open. Original cause of wound was Trauma. The date acquired was: 01/27/2021. The wound is located on the Left,Lateral Lower Leg. The wound measures 2.5cm length x 1.5cm width x 0.1cm depth; 2.945cm^2 area and 0.295cm^3 volume. There is Fat Layer (Subcutaneous Tissue) exposed. There is no tunneling or undermining noted. There is a medium amount of serosanguineous drainage noted. The wound margin is flat and intact. There is medium (34-66%) red granulation within the wound bed. There is a medium (34-66%) amount of necrotic tissue within the wound bed including Adherent Slough. 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 There was a Excisional Skin/Subcutaneous Tissue Debridement with a total area of 3.75 sq cm performed by Maxwell Caul., MD. With the following instrument(s): Curette to remove Viable and Non-Viable tissue/material. Material removed includes Subcutaneous Tissue and Slough and after achieving pain control using Lidocaine 4% T opical Solution. No specimens were taken. A time out was conducted at 08:55, prior to the start of the procedure. A Minimum amount of bleeding was controlled with Pressure. The procedure was tolerated well with a pain level of 7 throughout and a pain level of 4 following the procedure. Post Debridement Measurements: 2.5cm  length x 1.5cm width x 0.1cm depth; 0.295cm^3 volume. Character of Wound/Ulcer Post Debridement is improved. Severity of Tissue Post Debridement is: Fat layer exposed. Post procedure Diagnosis Wound #1: Same as Pre-Procedure Pre-procedure diagnosis of Wound #1 is a Diabetic Wound/Ulcer of the Lower Extremity located on the Left,Lateral Lower Leg . There was a Three Layer Compression Therapy Procedure by Zenaida Deed, RN. Post procedure Diagnosis Wound #1: Same as Pre-Procedure Plan Follow-up Appointments: Return Appointment in 1 week. - Dr. Leanord Hawking - room 7 Bathing/ Shower/ Hygiene: May shower with protection but do not get wound dressing(s) wet. Edema Control - Lymphedema / SCD / Other: Elevate legs to the level of the heart or above for 30 minutes daily and/or when sitting, a frequency of: - throughout the day Avoid standing for long periods of time. Exercise regularly The following medication(s) was prescribed: lidocaine topical 4 % cream cream  topical for prior to debridement was prescribed at facility WOUND #1: - Lower Leg Wound Laterality: Left, Lateral Peri-Wound Care: Triamcinolone 15 (g) 1 x Per Week/ Discharge Instructions: Use triamcinolone 15 (g) to periwound Peri-Wound Care: Sween Lotion (Moisturizing lotion) 1 x Per Week/ Discharge Instructions: Apply moisturizing lotion as directed Prim Dressing: IODOFLEX 0.9% Cadexomer Iodine Pad 4x6 cm 1 x Per Week/ ary Discharge Instructions: Apply to wound bed as instructed Secondary Dressing: Woven Gauze Sponge, Non-Sterile 4x4 in 1 x Per Week/ Discharge Instructions: Apply over primary dressing as directed. Com pression Wrap: ThreePress (3 layer compression wrap) 1 x Per Week/ Discharge Instructions: Apply three layer compression as directed. 1. I am going to use Iodoflex on the wound in order to help with the wound surface. Mechanical debridement may be necessary on subsequent visits. 2. I think she has some degree of chronic venous insufficiency likely contributing to the difficulty healing this area. She was treated with an Radio broadcast assistant for a while I think that was probably the correct approach. 3. Although she is listed as having PAD her last vascular evaluation at [facility] on 3/22 suggested sufficient blood flow to heal this area. I have no concerns about putting her in a 3 layer compression 4. The patient is marginally controlled diabetic she tells me she also has gait and balance problems. She is still working part-time at [employer]. I asked her to try and keep her leg elevated but really no restriction on activity. Electronic Signature(s) Signed: 04/07/2021 4:37:10 PM By: Baltazar Najjar MD Entered By: Baltazar Najjar on 04/07/2021 09:21:51 -------------------------------------------------------------------------------- HxROS Details Patient Name: Date of Service: Carla [name], [name]. 04/07/2021 8:00 A M Medical Record Number: [id number] Patient Account  Number: [id number] Date of Birth/Sex: Treating RN: [birth year] (71 y.o. Tommye Standard Primary Care Provider: DO Luvenia Redden, RO BERT Other Clinician: Referring Provider: Treating Provider/Extender: Fransico Meadow, RO BERT Weeks in Treatment: 0 Information Obtained From Patient Chart Constitutional Symptoms (General Health) Complaints and Symptoms: Negative for: Fatigue; Fever; Chills; Marked Weight Change Medical History: Past Medical History Notes: obesity Eyes Complaints and Symptoms: Negative for: Dry Eyes; Vision Changes; Glasses / Contacts Medical History: Positive for: Cataracts - left eye removed Negative for: Glaucoma Ear/Nose/Mouth/Throat Complaints and Symptoms: Negative for: Chronic sinus problems or rhinitis Respiratory Complaints and Symptoms: Negative for: Chronic or frequent coughs; Shortness of Breath Gastrointestinal Complaints and Symptoms: Negative for: Frequent diarrhea; Nausea; Vomiting Endocrine Complaints and Symptoms: Negative for: Heat/cold intolerance Medical History: Positive for: Type II Diabetes Negative for: Type I Diabetes Time with diabetes: >15 years  Treated with: Insulin, Oral agents Blood sugar tested every day: Yes Tested : Genitourinary Complaints and Symptoms: Negative for: Frequent urination Integumentary (Skin) Complaints and Symptoms: Positive for: Wounds - left lower leg Medical History: Negative for: History of Burn Musculoskeletal Complaints and Symptoms: Positive for: Muscle Pain Negative for: Muscle Weakness Medical History: Positive for: Osteoarthritis Negative for: Gout; Rheumatoid Arthritis; Osteomyelitis Neurologic Complaints and Symptoms: Positive for: Numbness/parasthesias - feet Medical History: Positive for: Neuropathy Psychiatric Complaints and Symptoms: Negative for: Claustrophobia; Suicidal Medical History: Negative for: Anorexia/bulimia; Confinement  Anxiety Hematologic/Lymphatic Cardiovascular Medical History: Positive for: Hypertension Past Medical History Notes: hyperlipidemia Immunological Oncologic Medical History: Negative for: Received Chemotherapy; Received Radiation HBO Extended History Items Eyes: Cataracts Immunizations Pneumococcal Vaccine: Received Pneumococcal Vaccination: Yes Received Pneumococcal Vaccination On or After 60th Birthday: Yes Implantable Devices None Hospitalization / Surgery History Type of Hospitalization/Surgery left cataract extraction c-section total abdominal hysterectomy w/ bil salpingoophrectomy wisdom tooth extraction Family and Social History Cancer: Yes - Siblings,Father; Diabetes: Yes - Siblings; Heart Disease: Yes - Siblings,Mother; Hereditary Spherocytosis: No; Hypertension: Yes - Siblings,Mother; Kidney Disease: No; Lung Disease: Yes - Siblings; Seizures: No; Stroke: Yes - Siblings,Mother; Thyroid Problems: Yes - Siblings; Tuberculosis: No; Never smoker; Marital Status - Divorced; Alcohol Use: Rarely; Drug Use: No History; Caffeine Use: Moderate - coffee; Financial Concerns: No; Food, Clothing or Shelter Needs: No; Support System Lacking: No; Transportation Concerns: No Electronic Signature(s) Signed: 04/07/2021 4:36:42 PM By: Zenaida DeedBoehlein, Linda RN, BSN Signed: 04/07/2021 4:37:10 PM By: Baltazar Najjarobson, Gaylon Bentz MD Entered By: Zenaida DeedBoehlein, Carla Stokes on 04/07/2021 08:27:41 -------------------------------------------------------------------------------- SuperBill Details Patient Name: Date of Service: Carla Stokes, Carla Stokes NDA C. 04/07/2021 Medical Record Number: [id number] Patient Account Number: [id number] Date of Birth/Sex: Treating RN: 05/01/1950 [age] y.o. Tommye StandardF) Boehlein, Carla Stokes Primary Care Provider: DO Luvenia ReddenUGH, RO BERT Other Clinician: Referring Provider: Treating Provider/Extender: Fransico Meadowobson, Jontavious Commons DO UGH, RO BERT Weeks in Treatment: 0 Diagnosis Coding ICD-10 Codes Code Description [phone] Abrasion, left  lower leg, subsequent encounter L97.828 Non-pressure chronic ulcer of other part of left lower leg with other specified severity I87.332 Chronic venous hypertension (idiopathic) with ulcer and inflammation of left lower extremity Facility Procedures CPT4 Code: [id number] Description: 99213 - WOUND CARE VISIT-LEV 3 EST PT Modifier: 25 Quantity: 1 CPT4 Code: [id number] Description: 11042 - DEB SUBQ TISSUE 20 SQ CM/< ICD-10 Diagnosis Description L97.828 Non-pressure chronic ulcer of other part of left lower leg with other specified s Modifier: everity Quantity: 1 Physician Procedures : CPT4 Code Description Modifier [id number] WC PHYS LEVEL 3 NEW PT 25 ICD-10 Diagnosis Description S80.812D Abrasion, left lower leg, subsequent encounter L97.828 Non-pressure chronic ulcer of other part of left lower leg with other specified severity  I87.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

## 2021-04-07 NOTE — Progress Notes (Signed)
Carla, Stokes ([id number]) Visit Report for 04/07/2021 Allergy List Details Patient Name: Date of Service: MA [place], [name]. 04/07/2021 8:00 A M Medical Record Number: [id number] Patient Account Number: [id number] Date of Birth/Sex: Treating RN: Jul 02, 1950 (71 y.o. Carla Stokes Primary Care Renny Remer: DO Luvenia Redden, RO BERT Other Clinician: Referring Yamel Bale: Treating Arvil Utz/Extender: Fransico Meadow, RO BERT Weeks in Treatment: 0 Allergies Active Allergies codeine Reaction: dizziness Severity: Moderate Marcelline Deist Reaction: yeast infection Allergy Notes Electronic Signature(s) Signed: 04/07/2021 4:36:42 PM By: Zenaida Deed RN, BSN Entered By: Zenaida Deed on 04/07/2021 08:17:46 -------------------------------------------------------------------------------- Arrival Information Details Patient Name: Date of Service: MA Carla Stokes, [name]. 04/07/2021 8:00 A M Medical Record Number: [id number] Patient Account Number: [id number] Date of Birth/Sex: Treating RN: Sep 02, 1950 (71 y.o. Carla Stokes Primary Care Yohana Bartha: DO Luvenia Redden, RO BERT Other Clinician: Referring Maycee Blasco: Treating Delta Pichon/Extender: Fransico Meadow, RO BERT Weeks in Treatment: 0 Visit Information Patient Arrived: Ambulatory Arrival Time: 08:10 Accompanied By: sisters Transfer Assistance: None Patient Identification Verified: Yes Secondary Verification Process Completed: Yes Patient Requires Transmission-Based Precautions: No Patient Has Alerts: Yes Patient Alerts: Bil ABIs N/C, TBIs WNL Electronic Signature(s) Signed: 04/07/2021 4:36:42 PM By: Zenaida Deed RN, BSN Entered By: Zenaida Deed on 04/07/2021 11:50:12 -------------------------------------------------------------------------------- Clinic Level of Care Assessment Details Patient Name: Date of Service: MA [name], [name]. 04/07/2021 8:00 A M Medical Record Number: [id number] Patient Account Number: [id number] Date of Birth/Sex:  Treating RN: 1950-11-06 (71 y.o. Carla Stokes Primary Care Jjesus Dingley: DO Luvenia Redden, RO BERT Other Clinician: Referring Jaanvi Fizer: Treating Adilyn Humes/Extender: Baltazar Najjar DO UGH, RO BERT Weeks in Treatment: 0 Clinic Level of Care Assessment Items TOOL 1 Quantity Score []  - 0 Use when EandM and Procedure is performed on INITIAL visit ASSESSMENTS - Nursing Assessment / Reassessment X- 1 20 General Physical Exam (combine w/ comprehensive assessment (listed just below) when performed on new pt. evals) X- 1 25 Comprehensive Assessment (HX, ROS, Risk Assessments, Wounds Hx, etc.) ASSESSMENTS - Wound and Skin Assessment / Reassessment []  - 0 Dermatologic / Skin Assessment (not related to wound area) ASSESSMENTS - Ostomy and/or Continence Assessment and Care []  - 0 Incontinence Assessment and Management []  - 0 Ostomy Care Assessment and Management (repouching, etc.) PROCESS - Coordination of Care X - Simple Patient / Family Education for ongoing care 1 15 []  - 0 Complex (extensive) Patient / Family Education for ongoing care X- 1 10 Staff obtains , Records, T Results / Process Orders est []  - 0 Staff telephones HHA, Nursing Homes / Clarify orders / etc []  - 0 Routine Transfer to another Facility (non-emergent condition) []  - 0 Routine Hospital Admission (non-emergent condition) X- 1 15 New Admissions / / Ordering NPWT Apligraf, etc. , []  - 0 Emergency Hospital Admission (emergent condition) PROCESS - Special Needs []  - 0 Pediatric / Minor Patient Management []  - 0 Isolation Patient Management []  - 0 Hearing / Language / Visual special needs []  - 0 Assessment of Community assistance (transportation, D/C planning, etc.) []  - 0 Additional assistance / Altered mentation []  - 0 Support Surface(s) Assessment (bed, cushion, seat, etc.) INTERVENTIONS - Miscellaneous []  - 0 External ear exam []  - 0 Patient Transfer (multiple staff / / Similar devices) []  - 0 Simple Staple / Suture removal (25 or less) []  - 0 Complex Staple / Suture removal (26 or more) []  - 0 Hypo/Hyperglycemic Management (do not check if billed separately) []  - 0 Ankle / Brachial Index (  ABI) - do not check if billed separately Has the patient been seen at the hospital within the last three years: Yes Total Score: 85 Level Of Care: New/Established - Level 3 Electronic Signature(s) Signed: 04/07/2021 4:36:42 PM By: Zenaida Deed RN, BSN Entered By: Zenaida Deed on 04/07/2021 09:03:47 -------------------------------------------------------------------------------- Compression Therapy Details Patient Name: Date of Service: MA Carla Stokes, [name] NDA C. 04/07/2021 8:00 A M Medical Record Number: [id number] Patient Account Number: [id number] Date of Birth/Sex: Treating RN: 1950/12/07 (71 y.o. Carla Stokes Primary Care Jenesis Martin: DO Luvenia Redden, RO BERT Other Clinician: Referring Mellisa Arshad: Treating Cyndee Giammarco/Extender: Fransico Meadow, RO BERT Weeks in Treatment: 0 Compression Therapy Performed for Wound Assessment: Wound #1 Left,Lateral Lower Leg Performed By: Clinician Zenaida Deed, RN Compression Type: Three Layer Post Procedure Diagnosis Same as Pre-procedure Electronic Signature(s) Signed: 04/07/2021 4:36:42 PM By: Zenaida Deed RN, BSN Entered By: Zenaida Deed on 04/07/2021 08:59:12 -------------------------------------------------------------------------------- Encounter Discharge Information Details Patient Name: Date of Service: MA Carla Stokes, [name]. 04/07/2021 8:00 A M Medical Record Number: [id number] Patient Account Number: [id number] Date of Birth/Sex: Treating RN: [birth year] (71 y.o. Carla Stokes Primary Care Aleane Wesenberg: DO Luvenia Redden, RO BERT Other Clinician: Referring Drew Lips: Treating Brittnay Pigman/Extender: Fransico Meadow, RO BERT Weeks in Treatment: 0 Encounter Discharge Information Items Post Procedure  Vitals Discharge Condition: Stable Temperature (F): 98.8 Ambulatory Status: Ambulatory Pulse (bpm): 60 Discharge Destination: Home Respiratory Rate (breaths/min): 18 Transportation: Private Auto Blood Pressure (mmHg): 175/78 Accompanied By: sisters Schedule Follow-up Appointment: Yes Clinical Summary of Care: Patient Declined Electronic Signature(s) Signed: 04/07/2021 4:36:42 PM By: Zenaida Deed RN, BSN Entered By: Zenaida Deed on 04/07/2021 09:24:11 -------------------------------------------------------------------------------- Lower Extremity Assessment Details Patient Name: Date of Service: MA [place], [name]. 04/07/2021 8:00 A M Medical Record Number: [id number] Patient Account Number: [id number] Date of Birth/Sex: Treating RN: [birth year] (71 y.o. Carla Stokes Primary Care Harryette Shuart: DO Luvenia Redden, RO BERT Other Clinician: Referring Bakari Nikolai: Treating Winferd Wease/Extender: Baltazar Najjar DO UGH, RO BERT Weeks in Treatment: 0 Edema Assessment Assessed: [Left: No] [Right: No] Edema: [Left: Ye] [Right: s] Calf Left: Right: Point of Measurement: From Medial Instep 38 cm Ankle Left: Right: Point of Measurement: From Medial Instep 24.2 cm Vascular Assessment Pulses: Dorsalis Pedis Palpable: [Left:Yes] Electronic Signature(s) Signed: 04/07/2021 4:36:42 PM By: Zenaida Deed RN, BSN Entered By: Zenaida Deed on 04/07/2021 08:35:54 -------------------------------------------------------------------------------- Multi Wound Chart Details Patient Name: Date of Service: MA Carla Stokes, [name] NDA C. 04/07/2021 8:00 A M Medical Record Number: [id number] Patient Account Number: [id number] Date of Birth/Sex: Treating RN: Apr 20, 1950 (71 y.o. Carla Stokes Primary Care Ravin Bendall: DO Luvenia Redden, RO BERT Other Clinician: Referring Karolyne Timmons: Treating Wagner Tanzi/Extender: Baltazar Najjar DO UGH, RO BERT Weeks in Treatment: 0 Vital Signs Height(in): 67 Capillary Blood Glucose(mg/dl):  867 Weight(lbs): Pulse(bpm): 60 Body Mass Index(BMI): Blood Pressure(mmHg): 175/78 Temperature(F): 98.8 Respiratory Rate(breaths/min): 18 Photos: [N/A:N/A] Left, Lateral Lower Leg N/A N/A Wound Location: Trauma N/A N/A Wounding Event: Diabetic Wound/Ulcer of the Lower N/A N/A Primary Etiology: Extremity Venous Leg Ulcer N/A N/A Secondary Etiology: Cataracts, Hypertension, Type II N/A N/A Comorbid History: Diabetes, Osteoarthritis, Neuropathy 01/27/2021 N/A N/A Date Acquired: 0 N/A N/A Weeks of Treatment: Open N/A N/A Wound Status: No N/A N/A Wound Recurrence: 2.5x1.5x0.1 N/A N/A Measurements L x W x D (cm) 2.945 N/A N/A A (cm) : rea 0.295 N/A N/A Volume (cm) : 0.00% N/A N/A % Reduction in Area: 0.00% N/A N/A % Reduction in Volume: Grade 1 N/A N/A Classification: Medium N/A N/A Exudate A mount: Serosanguineous N/A N/A  Exudate Type: red, brown N/A N/A Exudate Color: Flat and Intact N/A N/A Wound Margin: Medium (34-66%) N/A N/A Granulation A mount: Red N/A N/A Granulation Quality: Medium (34-66%) N/A N/A Necrotic A mount: Fat Layer (Subcutaneous Tissue): Yes N/A N/A Exposed Structures: Fascia: No Tendon: No Muscle: No Joint: No Bone: No Small (1-33%) N/A N/A Epithelialization: Debridement - Excisional N/A N/A Debridement: Pre-procedure Verification/Time Out 08:55 N/A N/A Taken: Lidocaine 4% Topical Solution N/A N/A Pain Control: Subcutaneous, Slough N/A N/A Tissue Debrided: Skin/Subcutaneous Tissue N/A N/A Level: 3.75 N/A N/A Debridement A (sq cm): rea Curette N/A N/A Instrument: Minimum N/A N/A Bleeding: Pressure N/A N/A Hemostasis A chieved: 7 N/A N/A Procedural Pain: 4 N/A N/A Post Procedural Pain: Procedure was tolerated well N/A N/A Debridement Treatment Response: 2.5x1.5x0.1 N/A N/A Post Debridement Measurements L x W x D (cm) 0.295 N/A N/A Post Debridement Volume: (cm) Compression Therapy N/A N/A Procedures  Performed: Debridement Treatment Notes Electronic Signature(s) Signed: 04/07/2021 4:36:42 PM By: Zenaida DeedBoehlein, Linda RN, BSN Signed: 04/07/2021 4:37:10 PM By: Baltazar Najjarobson, Michael MD Entered By: Baltazar Najjarobson, Michael on 04/07/2021 09:14:34 -------------------------------------------------------------------------------- Multi-Disciplinary Care Plan Details Patient Name: [place] of Service[place] N[place]. 04/07/2021 8:00 A M Medical Record Number: [id number] Patient Account Number: [id number] Date of Birth/Sex: Treating RN: 06/23/1950 (71 y.o. Carla StandardF) Boehlein, Linda Primary Care Erland Vivas: DO Luvenia ReddenUGH, RO BERT Other Clinician: Referring Hampton Cost: Treating Danessa Mensch/Extender: Fransico Meadowobson, Michael DO UGH, RO BERT Weeks in Treatment: 0 Multidisciplinary Care Plan reviewed with physician Active Inactive Abuse / Safety / Falls / Self Care Management Nursing Diagnoses: History of Falls Potential for falls Goals: Patient/caregiver will verbalize/demonstrate measures taken to prevent injury and/or falls Date Initiated: 04/07/2021 Target Resolution Date: 05/05/2021 Goal Status: Active Interventions: Assess fall risk on admission and as needed Assess impairment of mobility on admission and as needed per policy Notes: Nutrition Nursing Diagnoses: Impaired glucose control: actual or potential Potential for alteratiion in Nutrition/Potential for imbalanced nutrition Goals: Patient/caregiver will maintain therapeutic glucose control Date Initiated: 04/07/2021 Target Resolution Date: 05/05/2021 Goal Status: Active Interventions: Assess HgA1c results as ordered upon admission and as needed Assess patient nutrition upon admission and as needed per policy Provide education on elevated blood sugars and impact on wound healing Treatment Activities: Patient referred to Primary Care Physician for further nutritional evaluation : 04/07/2021 Notes: Venous Leg Ulcer Nursing Diagnoses: Knowledge deficit related to disease process and  management Potential for venous Insuffiency (use before diagnosis confirmed) Goals: Patient will maintain optimal edema control Date Initiated: 04/07/2021 Target Resolution Date: 05/05/2021 Goal Status: Active Interventions: Assess peripheral edema status every visit. Compression as ordered Provide education on venous insufficiency Treatment Activities: Therapeutic compression applied : 04/07/2021 Notes: Wound/Skin Impairment Nursing Diagnoses: Impaired tissue integrity Knowledge deficit related to ulceration/compromised skin integrity Goals: Patient/caregiver will verbalize understanding of skin care regimen Date Initiated: 04/07/2021 Target Resolution Date: 05/05/2021 Goal Status: Active Ulcer/skin breakdown will have a volume reduction of 30% by week 4 Date Initiated: 04/07/2021 Target Resolution Date: 05/05/2021 Goal Status: Active Interventions: Assess patient/caregiver ability to obtain necessary supplies Assess patient/caregiver ability to perform ulcer/skin care regimen upon admission and as needed Assess ulceration(s) every visit Provide education on ulcer and skin care Notes: Electronic Signature(s) Signed: 04/07/2021 4:36:42 PM By: Zenaida DeedBoehlein, Linda RN, BSN Entered By: Zenaida DeedBoehlein, Linda on 04/07/2021 08:50:42 -------------------------------------------------------------------------------- Pain Assessment Details Patient Name: Date of Service: [place] N[place] C. 04/07/2021 8:00 A M Medical Record Number: [id number] Patient Account Number: [id number] Date of Birth/Sex: Treating RN: 07/26/1950 [age] y.o. F) Boehlein,  Bonita Quin Primary Care Kanoa Phillippi: DO Luvenia Redden, RO BERT Other Clinician: Referring Ramal Eckhardt: Treating Thyra Yinger/Extender: Baltazar Najjar DO UGH, RO BERT Weeks in Treatment: 0 Active Problems Location of Pain Severity and Description of Pain Patient Has Paino Yes Site Locations Pain Location: Pain in Ulcers With Dressing Change: Yes Duration of the Pain. Constant /  Intermittento Intermittent Rate the pain. Current Pain Level: 6 Worst Pain Level: 9 Least Pain Level: 0 Character of Pain Describe the Pain: Aching, Tender Pain Management and Medication Current Pain Management: Medication: Yes Is the Current Pain Management Adequate: Adequate Rest: Yes How does your wound impact your activities of daily livingo Sleep: Yes Bathing: No Appetite: No Relationship With Others: No Bladder Continence: No Emotions: No Bowel Continence: No Work: No Toileting: No Drive: No Dressing: No Hobbies: No Electronic Signature(s) Signed: 04/07/2021 4:36:42 PM By: Zenaida Deed RN, BSN Entered By: Zenaida Deed on 04/07/2021 08:41:46 -------------------------------------------------------------------------------- Patient/Caregiver Education Details Patient Name: Date of Service: MA [place]aythe [place] NDA C. 2/8/2023andnbsp8:00 A M Medical Record Number: [id number] Patient Account Number: [id number] Date of Birth/Gender: Treating RN: 04-01-1950 (71 y.o. Carla Stokes Primary Care Physician: DO Luvenia Redden, RO BERT Other Clinician: Referring Physician: Treating Physician/Extender: Fransico Meadow, RO BERT Weeks in Treatment: 0 Education Assessment Education Provided To: Patient Education Topics Provided Elevated Blood Sugar/ Impact on Healing: Handouts: Elevated Blood Sugars: How Do They Affect Wound Healing Methods: Explain/Verbal, Printed Responses: Reinforcements needed, State content correctly Venous: Handouts: Controlling Swelling with Multilayered Compression Wraps, Managing Venous Disease and Related Ulcers Methods: Explain/Verbal, Printed Responses: Reinforcements needed, State content correctly Welcome [facility]: o Handouts: Welcome [facility] o Methods: Explain/Verbal, Printed Responses: Reinforcements needed, State content correctly Wound/Skin Impairment: Methods: Explain/Verbal Responses: Reinforcements  needed, State content correctly Electronic Signature(s) Signed: 04/07/2021 4:36:42 PM By: Zenaida Deed RN, BSN Entered By: Zenaida Deed on 04/07/2021 08:55:59 -------------------------------------------------------------------------------- Wound Assessment Details Patient Name: Date of Service: MA Carla [place], [name]. 04/07/2021 8:00 A M Medical Record Number: [id number] Patient Account Number: [id number] Date of Birth/Sex: Treating RN: [birth year] (71 y.o. Carla Stokes Primary Care Aritzel Krusemark: DO Luvenia Redden, RO BERT Other Clinician: Referring Indie Boehne: Treating Tieisha Darden/Extender: Baltazar Najjar DO UGH, RO BERT Weeks in Treatment: 0 Wound Status Wound Number: 1 Primary Diabetic Wound/Ulcer of the Lower Extremity Etiology: Wound Location: Left, Lateral Lower Leg Secondary Venous Leg Ulcer Wounding Event: Trauma Etiology: Date Acquired: 01/27/2021 Wound Status: Open Weeks Of Treatment: 0 Comorbid Cataracts, Hypertension, Type II Diabetes, Osteoarthritis, Clustered Wound: No History: Neuropathy Photos Wound Measurements Length: (cm) 2.5 Width: (cm) 1.5 Depth: (cm) 0.1 Area: (cm) 2.945 Volume: (cm) 0.295 % Reduction in Area: 0% % Reduction in Volume: 0% Epithelialization: Small (1-33%) Tunneling: No Undermining: No Wound Description Classification: Grade 1 Wound Margin: Flat and Intact Exudate Amount: Medium Exudate Type: Serosanguineous Exudate Color: red, brown Foul Odor After Cleansing: No Slough/Fibrino Yes Wound Bed Granulation Amount: Medium (34-66%) Exposed Structure Granulation Quality: Red Fascia Exposed: No Necrotic Amount: Medium (34-66%) Fat Layer (Subcutaneous Tissue) Exposed: Yes Necrotic Quality: Adherent Slough Tendon Exposed: No Muscle Exposed: No Joint Exposed: No Bone Exposed: No Treatment Notes Wound #1 (Lower Leg) Wound Laterality: Left, Lateral Cleanser Peri-Wound Care Triamcinolone 15 (g) Discharge Instruction: Use triamcinolone 15  (g) to periwound Sween Lotion (Moisturizing lotion) Discharge Instruction: Apply moisturizing lotion as directed Topical Primary Dressing IODOFLEX 0.9% Cadexomer Iodine Pad 4x6 cm Discharge Instruction: Apply to wound bed as instructed Secondary Dressing Woven Gauze Sponge, Non-Sterile 4x4 in Discharge Instruction: Apply  over primary dressing as directed. Secured With Compression Wrap ThreePress (3 layer compression wrap) Discharge Instruction: Apply three layer compression as directed. Compression Stockings Add-Ons Electronic Signature(s) Signed: 04/07/2021 4:36:42 PM By: Zenaida DeedBoehlein, Linda RN, BSN Signed: 04/07/2021 6:23:21 PM By: Zandra AbtsLynch, [name] RN, BSN Entered By: Zandra AbtsLynch, [name] on 04/07/2021 08:43:47 -------------------------------------------------------------------------------- Vitals Details Patient Name: Date of Service: [place] N[place]. 04/07/2021 8:00 A M Medical Record Number: [id number] Patient Account Number: [id number] Date of Birth/Sex: Treating RN: 02/16/1951 [age] y.o. Carla StandardF) Boehlein, Linda Primary Care Fantashia Shupert: DO Luvenia ReddenUGH, RO BERT Other Clinician: Referring Meera Vasco: Treating Lemya Greenwell/Extender: Baltazar Najjarobson, Michael DO UGH, RO BERT Weeks in Treatment: 0 Vital Signs Time Taken: 08:12 Temperature (F): 98.8 Height (in): 67 Pulse (bpm): 60 Source: Stated Respiratory Rate (breaths/min): 18 Blood Pressure (mmHg): 175/78 Capillary Blood Glucose (mg/dl): 409125 Reference Range: 80 - 120 mg / dl Notes glucose per pt report last night Electronic Signature(s) Signed: 04/07/2021 4:36:42 PM By: Zenaida DeedBoehlein, Linda RN, BSN Entered By: Zenaida DeedBoehlein, Linda on 04/07/2021 08:16:30

## 2021-04-07 NOTE — Progress Notes (Signed)
Carla Stokes, Carla Stokes ([id number]) Visit Report for 04/07/2021 Abuse Risk Screen Details Patient Name: Date of Service: MA [place], [name] NDA C. 04/07/2021 8:00 A M Medical Record Number: [id number] Patient Account Number: [id number] Date of Birth/Sex: Treating RN: 24-Dec-1950 (71 y.o. [name] Primary Care Shaneka Efaw: DO Seleta Rhymes, RO BERT Other Clinician: Referring French Kendra: Treating Jodeci Rini/Extender: Ok Edwards, RO BERT Weeks in Treatment: 0 Abuse Risk Screen Items Answer ABUSE RISK SCREEN: Has anyone close to you tried to hurt or harm you recentlyo No Do you feel uncomfortable with anyone in your familyo No Has anyone forced you do things that you didnt want to doo No Electronic Signature(s) Signed: 04/07/2021 4:36:42 PM By: Baruch Gouty RN, BSN Entered By: Baruch Gouty on 04/07/2021 08:28:00 -------------------------------------------------------------------------------- Activities of Daily Living Details Patient Name: Date of Service: MA [place], [name] NDA C. 04/07/2021 8:00 [place] Record Number: [id number] Patient Account Number: [id number] Date of Birth/Sex: Treating RN: 10-18-1950 (71 y.o. [name] Primary Care Kyona Chauncey: DO Seleta Rhymes, RO BERT Other Clinician: Referring Kili Gracy: Treating Wilberth Damon/Extender: Linton Ham DO [place], RO BERT Weeks in Treatment: 0 Activities of Daily Living Items Answer Activities of Daily Living (Please select one for each item) Drive Automobile Completely Able T Medications ake Completely Able Use T elephone Completely Able Care for Appearance Completely Able Use T oilet Completely Able Bath / Shower Completely Able Dress Self Completely Able Feed Self Completely Able Walk Completely Able Get In / Out Bed Completely Able Housework Completely Able Prepare Meals Completely Able Handle Money Completely Able Shop for Self Completely Able Electronic Signature(s) Signed: 04/07/2021 4:36:42 PM By: Baruch Gouty RN,  BSN Entered By: Baruch Gouty on 04/07/2021 08:28:20 -------------------------------------------------------------------------------- Education Screening Details Patient Name: Date of Service: MA [place], [name] NDA C. 04/07/2021 8:00 A M Medical Record Number: [id number] Patient Account Number: [id number] Date of Birth/Sex: Treating RN: Nov 07, 1950 (71 y.o. [name] Primary Care Catalina Salasar: DO Seleta Rhymes, RO BERT Other Clinician: Referring Iliya Spivack: Treating Raenah Murley/Extender: Ok Edwards, RO BERT Weeks in Treatment: 0 Primary Learner Assessed: Patient Learning Preferences/Education Level/Primary Language Learning Preference: Explanation, Demonstration, Printed Material Highest Education Level: High School Preferred Language: English Cognitive Barrier Language Barrier: No Translator Needed: No Memory Deficit: No Emotional Barrier: No Cultural/Religious Beliefs Affecting Medical Care: No Physical Barrier Impaired Vision: No Impaired Hearing: No Decreased Hand dexterity: No Knowledge/Comprehension Knowledge Level: High Comprehension Level: High Ability to understand written instructions: High Ability to understand verbal instructions: High Motivation Anxiety Level: Calm Cooperation: Cooperative Education Importance: Acknowledges Need Interest in Health Problems: Asks Questions Perception: Coherent Willingness to Engage in Self-Management High Activities: Readiness to Engage in Self-Management High Activities: Electronic Signature(s) Signed: 04/07/2021 4:36:42 PM By: Baruch Gouty RN, BSN Entered By: Baruch Gouty on 04/07/2021 08:29:03 -------------------------------------------------------------------------------- Fall Risk Assessment Details Patient Name: Date of Service: MA [place], [name] NDA C. 04/07/2021 8:00 A M Medical Record Number: [id number] Patient Account Number: [id number] Date of Birth/Sex: Treating RN: [birth year] (72 y.o. [name] Primary Care Bertine Schlottman: DO Seleta Rhymes, RO BERT Other Clinician: Referring Natasja Niday: Treating Sakia Schrimpf/Extender: Ok Edwards, RO BERT Weeks in Treatment: 0 Fall Risk Assessment Items Have you had 2 or more falls in the last 12 monthso 0 Yes Have you had any fall that resulted in injury in the last 12 monthso 0 Yes FALLS RISK SCREEN History of falling - immediate or within 3 months 25 Yes Secondary diagnosis (Do you have 2 or more medical diagnoseso) 0 No Ambulatory aid None/bed rest/wheelchair/nurse 0  Yes Crutches/cane/walker 0 No Furniture 0 No Intravenous therapy Access/Saline/Heparin Lock 0 No Gait/Transferring Normal/ bed rest/ wheelchair 0 Yes Weak (short steps with or without shuffle, stooped but able to lift head while walking, may seek 0 No support from furniture) Impaired (short steps with shuffle, may have difficulty arising from chair, head down, impaired 0 No balance) Mental Status Oriented to own ability 0 Yes Electronic Signature(s) Signed: 04/07/2021 4:36:42 PM By: Baruch Gouty RN, BSN Entered By: Baruch Gouty on 04/07/2021 08:29:19 -------------------------------------------------------------------------------- Foot Assessment Details Patient Name: Date of Service: MA [place], [name] NDA C. 04/07/2021 8:00 A M Medical Record Number: [id number] Patient Account Number: [id number] Date of Birth/Sex: Treating RN: 1950/05/01 (71 y.o. [name] Primary Care Bronda Alfred: DO [place], RO BERT Other Clinician: Referring Katora Fini: Treating Tavis Kring/Extender: Ok Edwards, RO BERT Weeks in Treatment: 0 Foot Assessment Items Site Locations + = Sensation present, - = Sensation absent, C = Callus, U = Ulcer R = Redness, W = Warmth, M = Maceration, PU = Pre-ulcerative lesion F = Fissure, S = Swelling, D = Dryness Assessment Right: Left: Other Deformity: No No Prior Foot Ulcer: No Yes Prior Amputation: No No Charcot Joint: No No Ambulatory Status:  Ambulatory Without Help Gait: Steady Electronic Signature(s) Signed: 04/07/2021 4:36:42 PM By: Baruch Gouty RN, BSN Entered By: Baruch Gouty on 04/07/2021 08:30:55 -------------------------------------------------------------------------------- Nutrition Risk Screening Details Patient Name: Date of Service: MA [place], [name] NDA C. 04/07/2021 8:00 A M Medical Record Number: [id number] Patient Account Number: [id number] Date of Birth/Sex: Treating RN: [birth year] (71 y.o. [name] Primary Care Ector Laurel: DO Seleta Rhymes, RO BERT Other Clinician: Referring Abhiraj Dozal: Treating Tysen Roesler/Extender: Linton Ham DO [place], RO BERT Weeks in Treatment: 0 Height (in): 67 Weight (lbs): Body Mass Index (BMI): Nutrition Risk Screening Items Score Screening NUTRITION RISK SCREEN: I have an illness or condition that made me change the kind and/or amount of food I eat 0 No I eat fewer than two meals per day 3 Yes I eat few fruits and vegetables, or milk products 0 No I have three or more drinks of beer, liquor or wine almost every day 0 No I have tooth or mouth problems that make it hard for me to eat 0 No I don't always have enough money to buy the food I need 0 No I eat alone most of the time 1 Yes I take three or more different prescribed or over-the-counter drugs a day 1 Yes Without wanting to, I have lost or gained 10 pounds in the last six months 0 No I am not always physically able to shop, cook and/or feed myself 0 No Nutrition Protocols Good Risk Protocol Provide education on elevated blood Moderate Risk Protocol 0 sugars and impact on wound healing, as applicable High Risk Proctocol Risk Level: Moderate Risk Score: 5 Electronic Signature(s) Signed: 04/07/2021 4:36:42 PM By: Baruch Gouty RN, BSN Entered By: Baruch Gouty on 04/07/2021 08:29:58

## 2021-04-14 ENCOUNTER — Other Ambulatory Visit: Payer: Self-pay

## 2021-04-14 ENCOUNTER — Encounter (HOSPITAL_BASED_OUTPATIENT_CLINIC_OR_DEPARTMENT_OTHER): Payer: Medicare HMO | Admitting: Internal Medicine

## 2021-04-14 DIAGNOSIS — S80812A Abrasion, left lower leg, initial encounter: Secondary | ICD-10-CM | POA: Diagnosis not present

## 2021-04-15 NOTE — Progress Notes (Signed)
IYANNAH, GUMINA ([id number]) Visit Report for 04/14/2021 Debridement Details Patient Name: Date of Service: MA [place], [name] NDA C. 04/14/2021 9:00 A M Medical Record Number: [id number] Patient Account Number: [id number] Date of Birth/Sex: Treating RN: [birth year] (71 y.o. Nancy Fetter Primary Care Provider: DO [place], RO BERT Other Clinician: Referring Provider: Treating Provider/Extender: Ok Edwards, RO BERT Weeks in Treatment: 1 Debridement Performed for Assessment: Wound #1 Left,Lateral Lower Leg Performed By: Physician Ricard Dillon., MD Debridement Type: Debridement Severity of Tissue Pre Debridement: Fat layer exposed Level of Consciousness (Pre-procedure): Awake and Alert Pre-procedure Verification/Time Out Yes - 09:25 Taken: Start Time: 09:26 Pain Control: Lidocaine 5% topical ointment T Area Debrided (L x W): otal 2.5 (cm) x 1.2 (cm) = 3 (cm) Tissue and other material debrided: Viable, Non-Viable, Eschar, Slough, Subcutaneous, Skin: Dermis , Skin: Epidermis, Fibrin/Exudate, Slough Level: Skin/Subcutaneous Tissue Debridement Description: Excisional Instrument: Curette Bleeding: Minimum Hemostasis Achieved: Pressure End Time: 09:32 Procedural Pain: 2 Post Procedural Pain: 4 Response to Treatment: Procedure was tolerated well Level of Consciousness (Post- Awake and Alert procedure): Post Debridement Measurements of Total Wound Length: (cm) 2.5 Width: (cm) 1.5 Depth: (cm) 0.2 Volume: (cm) 0.589 Character of Wound/Ulcer Post Debridement: Requires Further Debridement Severity of Tissue Post Debridement: Fat layer exposed Post Procedure Diagnosis Same as Pre-procedure Electronic Signature(s) Signed: 04/14/2021 4:36:32 PM By: Linton Ham MD Signed: 04/15/2021 6:28:13 PM By: Levan Hurst RN, BSN Entered By: Linton Ham on 04/14/2021 09:53:34 -------------------------------------------------------------------------------- HPI Details Patient  Name: Date of Service: [name], [name] NDA C. 04/14/2021 9:00 A M Medical Record Number: [id number] Patient Account Number: [id number] Date of Birth/Sex: Treating RN: 28-Sep-1950 (71 y.o. Nancy Fetter Primary Care Provider: DO [place], RO BERT Other Clinician: Referring Provider: Treating Provider/Extender: Ok Edwards, RO BERT Weeks in Treatment: 1 History of Present Illness HPI Description: ADMISSION 04/07/2021 This is a 71 year old woman with type 2 diabetes. She states her problem began towards the end of November when she had a fall winter in the yard and injured her left lower leg. She has been followed by [facility]ry with [facility]. Saw them on 03/02/2021 she was felt to have cellulitis of the left ankle and the left leg wound. She ultimately was felt to have a venous stasis ulcer and I believe was put in an [employer]. She is not wearing that currently. She had several further visits. The wound became increasingly painful and on 04/01/2021 she went to the emergency room. An x-ray of the area showed vascular calcification but no bony abnormality. She was put on doxycycline which she is still taking. She does not have a history of prior wounds or venous insufficiency that she knows about. Past medical history includes type 2 diabetes with a A1c of 7.5 in October, hypertension, gout, hypercholesterolemia, diabetic foot ulcer and a history of PAD The patient follows with vascular surgery in [place]. He was able to see that she has previously had vascular evaluation most recently in March 2022. Although I do not have the exact numbers it is quoted in their last note it is showing noncompressible ABIs but normal TBI's and normal waveforms. As such there should not be any difficulty with sufficient blood flow to heal this wound. 2/15; 2 small areas which were initially traumatic but then complicated by infection. We have been using Iodoflex. Both wounds had a very necrotic  surface last week. Electronic Signature(s) Signed: 04/14/2021 4:36:32 PM By: Linton Ham MD Entered By: Dellia Nims,  Legrand Como on 04/14/2021 09:54:48 -------------------------------------------------------------------------------- Physical Exam Details Patient Name: Date of Service: MA [place], [name] NDA C. 04/14/2021 9:00 A M Medical Record Number: [id number] Patient Account Number: [id number] Date of Birth/Sex: Treating RN: 1950-03-26 (71 y.o. Nancy Fetter Primary Care Provider: DO [place], RO BERT Other Clinician: Referring Provider: Treating Provider/Extender: Ok Edwards, RO BERT Weeks in Treatment: 1 Constitutional Patient is hypertensive.. Pulse regular and within target range for patient.Marland Kitchen Respirations regular, non-labored and within target range.. Temperature is normal and within the target range for the patient.Marland Kitchen Appears in no distress. Notes Wound exam; the patient appears to be doing reasonably well although still requiring a aggressive mechanical debridement removing a completely nonviable necrotic surface from both of these areas. Hemostasis with direct pressure Electronic Signature(s) Signed: 04/14/2021 4:36:32 PM By: Linton Ham MD Entered By: Linton Ham on 04/14/2021 09:56:41 -------------------------------------------------------------------------------- Physician Orders Details Patient Name: Date of Service: [name], [name] NDA C. 04/14/2021 9:00 A M Medical Record Number: [id number] Patient Account Number: [id number] Date of Birth/Sex: Treating RN: 01-Jun-1950 (71 y.o. Helene Shoe, Meta.Reding Primary Care Provider: DO [place], RO BERT Other Clinician: Referring Provider: Treating Provider/Extender: Ok Edwards, RO BERT Weeks in Treatment: 1 Verbal / Phone Orders: No Diagnosis Coding ICD-10 Coding Code Description S80.812D Abrasion, left lower leg, subsequent encounter L97.828 Non-pressure chronic ulcer of other part of left lower leg with other  specified severity I87.332 Chronic venous hypertension (idiopathic) with ulcer and inflammation of left lower extremity Follow-up Appointments ppointment in 1 week. - Dr. Dellia Nims - room 7 Return A Bathing/ Shower/ Hygiene May shower with protection but do not get wound dressing(s) wet. Edema Control - Lymphedema / SCD / Other Left Lower Extremity Elevate legs to the level of the heart or above for 30 minutes daily and/or when sitting, a frequency of: - 3-4 times a day throughout the day. Avoid standing for long periods of time. Exercise regularly Moisturize legs daily. - right leg every night before bed. Wound Treatment Wound #1 - Lower Leg Wound Laterality: Left, Lateral Peri-Wound Care: Triamcinolone 15 (g) 1 x Per Week Discharge Instructions: Use triamcinolone 15 (g) to periwound Peri-Wound Care: Sween Lotion (Moisturizing lotion) 1 x Per Week Discharge Instructions: Apply moisturizing lotion as directed Prim Dressing: IODOFLEX 0.9% Cadexomer Iodine Pad 4x6 cm 1 x Per Week ary Discharge Instructions: Apply to wound bed as instructed Secondary Dressing: Zetuvit Plus 4x8 in 1 x Per Week Discharge Instructions: Apply over primary dressing as directed. Compression Wrap: ThreePress (3 layer compression wrap) 1 x Per Week Discharge Instructions: Apply three layer compression as directed. Electronic Signature(s) Signed: 04/14/2021 4:36:32 PM By: Linton Ham MD Signed: 04/14/2021 5:30:32 PM By: Deon Pilling RN, BSN Entered By: Deon Pilling on 04/14/2021 09:34:32 -------------------------------------------------------------------------------- Problem List Details Patient Name: Date of Service: MA [place], [name] NDA C. 04/14/2021 9:00 A M Medical Record Number: [id number] Patient Account Number: [id number] Date of Birth/Sex: Treating RN: Aug 04, 1950 (71 y.o. Debby Bud Primary Care Provider: DO [place], RO BERT Other Clinician: Referring Provider: Treating Provider/Extender: Ok Edwards, RO BERT Weeks in Treatment: 1 Active Problems ICD-10 Encounter Code Description Active Date MDM Diagnosis S80.812D Abrasion, left lower leg, subsequent encounter 04/07/2021 No Yes L97.828 Non-pressure chronic ulcer of other part of left lower leg with other specified 04/07/2021 No Yes severity I87.332 Chronic venous hypertension (idiopathic) with ulcer and inflammation of left 04/07/2021 No Yes lower extremity Inactive Problems Resolved Problems Electronic Signature(s) Signed: 04/14/2021 4:36:32 PM By: Linton Ham MD  Entered By: Linton Ham on 04/14/2021 09:53:18 -------------------------------------------------------------------------------- Progress Note Details Patient Name: Date of Service: [name], [name] NDA C. 04/14/2021 9:00 A M Medical Record Number: [id number] Patient Account Number: [id number] Date of Birth/Sex: Treating RN: 1951/01/22 (71 y.o. Nancy Fetter Primary Care Provider: DO [place], RO BERT Other Clinician: Referring Provider: Treating Provider/Extender: Ok Edwards, RO BERT Weeks in Treatment: 1 Subjective History of Present Illness (HPI) ADMISSION 04/07/2021 This is a 71 year old woman with type 2 diabetes. She states her problem began towards the end of November when she had a fall winter in the yard and injured her left lower leg. She has been followed [facility]. Saw them on 03/02/2021 she was felt to have cellulitis of the left ankle and the left leg wound. She ultimately was felt to have a venous stasis ulcer and I believe was put in an [employer]. She is not wearing that currently. She had several further visits. The wound became increasingly painful and on 04/01/2021 she went to the emergency room. An x-ray of the area showed vascular calcification but no bony abnormality. She was put on doxycycline which she is still taking. She does not have a history of prior wounds or venous insufficiency that she  knows about. Past medical history includes type 2 diabetes with a A1c of 7.5 in October, hypertension, gout, hypercholesterolemia, diabetic foot ulcer and a history of PAD The patient follows with vascular surgery in [place]. He was able to see that she has previously had vascular evaluation most recently in March 2022. Although I do not have the exact numbers it is quoted in their last note it is showing noncompressible ABIs but normal TBI's and normal waveforms. As such there should not be any difficulty with sufficient blood flow to heal this wound. 2/15; 2 small areas which were initially traumatic but then complicated by infection. We have been using Iodoflex. Both wounds had a very necrotic surface last week. Objective Constitutional Patient is hypertensive.. Pulse regular and within target range for patient.Marland Kitchen Respirations regular, non-labored and within target range.. Temperature is normal and within the target range for the patient.Marland Kitchen Appears in no distress. Vitals Time Taken: 9:05 AM, Height: 67 in, Temperature: 98.4 F, Pulse: 51 bpm, Respiratory Rate: 20 breaths/min, Blood Pressure: 149/83 mmHg, Capillary Blood Glucose: 156 mg/dl. General Notes: Wound exam; the patient appears to be doing reasonably well although still requiring a aggressive mechanical debridement removing a completely nonviable necrotic surface from both of these areas. Hemostasis with direct pressure Integumentary (Hair, Skin) Wound #1 status is Open. Original cause of wound was Trauma. The date acquired was: 01/27/2021. The wound has been in treatment 1 weeks. The wound is located on the Left,Lateral Lower Leg. The wound measures 2.5cm length x 1.5cm width x 0.2cm depth; 2.945cm^2 area and 0.589cm^3 volume. There is Fat Layer (Subcutaneous Tissue) exposed. There is no tunneling or undermining noted. There is a medium amount of serosanguineous drainage noted. The wound margin is flat and intact. There is small  (1-33%) red granulation within the wound bed. There is a large (67-100%) amount of necrotic tissue within the wound bed including Eschar and Adherent Slough. Assessment Active Problems ICD-10 Abrasion, left lower leg, subsequent encounter Non-pressure chronic ulcer of other part of left lower leg with other specified severity Chronic venous hypertension (idiopathic) with ulcer and inflammation of left lower extremity Procedures Wound #1 Pre-procedure diagnosis of Wound #1 is a Diabetic Wound/Ulcer of the Lower  Extremity located on the Left,Lateral Lower Leg .Severity of Tissue Pre Debridement is: Fat layer exposed. There was a Excisional Skin/Subcutaneous Tissue Debridement with a total area of 3 sq cm performed by Ricard Dillon., MD. With the following instrument(s): Curette to remove Viable and Non-Viable tissue/material. Material removed includes Eschar, Subcutaneous Tissue, Slough, Skin: Dermis, Skin: Epidermis, and Fibrin/Exudate after achieving pain control using Lidocaine 5% topical ointment. A time out was conducted at 09:25, prior to the start of the procedure. A Minimum amount of bleeding was controlled with Pressure. The procedure was tolerated well with a pain level of 2 throughout and a pain level of 4 following the procedure. Post Debridement Measurements: 2.5cm length x 1.5cm width x 0.2cm depth; 0.589cm^3 volume. Character of Wound/Ulcer Post Debridement requires further debridement. Severity of Tissue Post Debridement is: Fat layer exposed. Post procedure Diagnosis Wound #1: Same as Pre-Procedure Pre-procedure diagnosis of Wound #1 is a Diabetic Wound/Ulcer of the Lower Extremity located on the Left,Lateral Lower Leg . There was a Three Layer Compression Therapy Procedure by Deon Pilling, RN. Post procedure Diagnosis Wound #1: Same as Pre-Procedure Plan Follow-up Appointments: Return Appointment in 1 week. - Dr. Dellia Nims - room 7 Bathing/ Shower/ Hygiene: May shower with  protection but do not get wound dressing(s) wet. Edema Control - Lymphedema / SCD / Other: Elevate legs to the level of the heart or above for 30 minutes daily and/or when sitting, a frequency of: - 3-4 times a day throughout the day. Avoid standing for long periods of time. Exercise regularly Moisturize legs daily. - right leg every night before bed. WOUND #1: - Lower Leg Wound Laterality: Left, Lateral Peri-Wound Care: Triamcinolone 15 (g) 1 x Per Week/ Discharge Instructions: Use triamcinolone 15 (g) to periwound Peri-Wound Care: Sween Lotion (Moisturizing lotion) 1 x Per Week/ Discharge Instructions: Apply moisturizing lotion as directed Prim Dressing: IODOFLEX 0.9% Cadexomer Iodine Pad 4x6 cm 1 x Per Week/ ary Discharge Instructions: Apply to wound bed as instructed Secondary Dressing: Zetuvit Plus 4x8 in 1 x Per Week/ Discharge Instructions: Apply over primary dressing as directed. Com pression Wrap: ThreePress (3 layer compression wrap) 1 x Per Week/ Discharge Instructions: Apply three layer compression as directed. 1. I am still using Iodoflex to try and clean off the surfaces of both of these wounds. Both visits to this clinic she has required aggressive debridement with a #3 curette. Electronic Signature(s) Signed: 04/14/2021 4:36:32 PM By: Linton Ham MD Entered By: Linton Ham on 04/14/2021 09:57:29 -------------------------------------------------------------------------------- SuperBill Details Patient Name: Date of Service: [name], [name] NDA C. 04/14/2021 Medical Record Number: [id number] Patient Account Number: [id number] Date of Birth/Sex: Treating RN: 1950-07-20 (71 y.o. 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 severity I87.332 Chronic venous hypertension (idiopathic) with ulcer and inflammation of left lower extremity Facility Procedures CPT4 Code: [id number] Description: [id number] - DEB SUBQ TISSUE 20 SQ CM/< ICD-10 Diagnosis Description L97.828 Non-pressure chronic ulcer of other part of left lower leg with other specified Modifier: severity Quantity: 1 Physician Procedures : CPT4 Code Description Modifier [id number] - WC PHYS SUBQ TISS 20 SQ CM ICD-10 Diagnosis Description L97.828 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

## 2021-04-15 NOTE — Progress Notes (Signed)
Carla Stokes, Carla Stokes ([id number]) Visit Report for 04/14/2021 Arrival Information Details Patient Name: Date of Service: MA [place], [name] NDA C. 04/14/2021 9:00 A M Medical Record Number: [id number] Patient Account Number: [id number] Date of Birth/Sex: Treating RN: Dec 27, 1950 (71 y.o. Carla Stokes, Meta.Reding Primary Care Comer Devins: DO UGH, RO BERT Other Clinician: Referring Kohei Antonellis: Treating Arlayne Liggins/Extender: Ok Edwards, RO BERT Weeks in Treatment: 1 Visit Information History Since Last Visit Added or deleted any medications: No Patient Arrived: Ambulatory Any new allergies or adverse reactions: No Arrival Time: 09:05 Had a fall or experienced change in No Accompanied By: self activities of daily living that may affect Transfer Assistance: None risk of falls: Patient Identification Verified: Yes Signs or symptoms of abuse/neglect since last visito No Secondary Verification Process Completed: Yes Hospitalized since last visit: No Patient Requires Transmission-Based Precautions: No Implantable device outside of the clinic excluding No Patient Has Alerts: Yes cellular tissue based products placed in the center Patient Alerts: Bil ABIs N/C, TBIs WNL since last visit: Has Dressing in Place as Prescribed: Yes Has Compression in Place as Prescribed: Yes Pain Present Now: No Electronic Signature(s) Signed: 04/14/2021 5:30:32 PM By: Deon Pilling RN, BSN Entered By: Deon Pilling on 04/14/2021 09:05:46 -------------------------------------------------------------------------------- Compression Therapy Details Patient Name: Date of Service: [name], [name] NDA C. 04/14/2021 9:00 A M Medical Record Number: [id number] Patient Account Number: [id number] Date of Birth/Sex: Treating RN: 07/25/1950 (71 y.o. Carla Stokes Primary Care Coren Crownover: DO Seleta Rhymes, RO BERT Other Clinician: Referring Sarath Privott: Treating Corion Sherrod/Extender: Linton Ham DO UGH, RO BERT Weeks in Treatment: 1 Compression  Therapy Performed for Wound Assessment: Wound #1 Left,Lateral Lower Leg Performed By: Clinician Deon Pilling, RN Compression Type: Three Layer Post Procedure Diagnosis Same as Pre-procedure Electronic Signature(s) Signed: 04/14/2021 5:30:32 PM By: Deon Pilling RN, BSN Entered By: Deon Pilling on 04/14/2021 09:32:13 -------------------------------------------------------------------------------- Encounter Discharge Information Details Patient Name: Date of Service: [name], [name] NDA C. 04/14/2021 9:00 A M Medical Record Number: [id number] Patient Account Number: [id number] Date of Birth/Sex: Treating RN: [birth year] (71 y.o. Carla Stokes Primary Care Ashauna Bertholf: DO Seleta Rhymes, RO BERT Other Clinician: Referring Cesily Cuoco: Treating Seirra Kos/Extender: Ok Edwards, RO BERT Weeks in Treatment: 1 Encounter Discharge Information Items Post Procedure Vitals Discharge Condition: Stable Temperature (F): 98.4 Ambulatory Status: Ambulatory Pulse (bpm): 51 Discharge Destination: Home Respiratory Rate (breaths/min): 20 Transportation: Private Auto Blood Pressure (mmHg): 149/83 Accompanied By: self Schedule Follow-up Appointment: Yes Clinical Summary of Care: Electronic Signature(s) Signed: 04/14/2021 5:30:32 PM By: Deon Pilling RN, BSN Entered By: Deon Pilling on 04/14/2021 09:36:33 -------------------------------------------------------------------------------- Lower Extremity Assessment Details Patient Name: Date of Service: MA [place], [name] NDA C. 04/14/2021 9:00 A M Medical Record Number: [id number] Patient Account Number: [id number] Date of Birth/Sex: Treating RN: 1951-02-11 (71 y.o. Carla Stokes Primary Care Eileen Kangas: DO UGH, RO BERT Other Clinician: Referring Arieliz Latino: Treating Lareina Espino/Extender: Ok Edwards, RO BERT Weeks in Treatment: 1 Edema Assessment Assessed: [Left: Yes] [Right: No] Edema: [Left: Ye] [Right: s] Calf Left: Right: Point of  Measurement: From Medial Instep 36 cm Ankle Left: Right: Point of Measurement: From Medial Instep 22 cm Vascular Assessment Pulses: Dorsalis Pedis Palpable: [Left:Yes] Electronic Signature(s) Signed: 04/14/2021 5:30:32 PM By: Deon Pilling RN, BSN Entered By: Deon Pilling on 04/14/2021 09:10:04 -------------------------------------------------------------------------------- Multi Wound Chart Details Patient Name: Date of Service: [name], [name] NDA C. 04/14/2021 9:00 A M Medical Record Number: [id number] Patient Account Number: [id number] Date of Birth/Sex: Treating RN: Aug 25, 1950 (71 y.o. Carla Stokes Primary Care  Leith Szafranski: Lula Olszewski, RO BERT Other Clinician: Referring Kaijah Abts: Treating Blue Ruggerio/Extender: Ok Edwards, RO BERT Weeks in Treatment: 1 Vital Signs Height(in): 52 Capillary Blood Glucose(mg/dl): 156 Weight(lbs): Pulse(bpm): 50 Body Mass Index(BMI): Blood Pressure(mmHg): 149/83 Temperature(F): 98.4 Respiratory Rate(breaths/min): 20 Photos: [N/A:N/A] Left, Lateral Lower Leg N/A N/A Wound Location: Trauma N/A N/A Wounding Event: Diabetic Wound/Ulcer of the Lower N/A N/A Primary Etiology: Extremity Venous Leg Ulcer N/A N/A Secondary Etiology: Cataracts, Hypertension, Type II N/A N/A Comorbid History: Diabetes, Osteoarthritis, Neuropathy 01/27/2021 N/A N/A Date Acquired: 1 N/A N/A Weeks of Treatment: Open N/A N/A Wound Status: No N/A N/A Wound Recurrence: 2.5x1.5x0.2 N/A N/A Measurements L x W x D (cm) 2.945 N/A N/A A (cm) : rea 0.589 N/A N/A Volume (cm) : 0.00% N/A N/A % Reduction in A rea: -99.70% N/A N/A % Reduction in Volume: Grade 1 N/A N/A Classification: Medium N/A N/A Exudate A mount: Serosanguineous N/A N/A Exudate Type: red, brown N/A N/A Exudate Color: Flat and Intact N/A N/A Wound Margin: Small (1-33%) N/A N/A Granulation A mount: Red N/A N/A Granulation Quality: Large (67-100%) N/A N/A Necrotic A  mount: Eschar, Adherent Slough N/A N/A Necrotic Tissue: Fat Layer (Subcutaneous Tissue): Yes N/A N/A Exposed Structures: Fascia: No Tendon: No Muscle: No Joint: No Bone: No Small (1-33%) N/A N/A Epithelialization: Debridement - Excisional N/A N/A Debridement: Pre-procedure Verification/Time Out 09:25 N/A N/A Taken: Lidocaine 5% topical ointment N/A N/A Pain Control: Necrotic/Eschar, Subcutaneous, N/A N/A Tissue Debrided: Slough Skin/Subcutaneous Tissue N/A N/A Level: 3 N/A N/A Debridement A (sq cm): rea Curette N/A N/A Instrument: Minimum N/A N/A Bleeding: Pressure N/A N/A Hemostasis Achieved: 2 N/A N/A Procedural Pain: 4 N/A N/A Post Procedural Pain: Debridement Treatment Response: Procedure was tolerated well N/A N/A Post Debridement Measurements L x 2.5x1.5x0.2 N/A N/A W x D (cm) 0.589 N/A N/A Post Debridement Volume: (cm) Compression Therapy N/A N/A Procedures Performed: Debridement Treatment Notes Wound #1 (Lower Leg) Wound Laterality: Left, Lateral Cleanser Peri-Wound Care Triamcinolone 15 (g) Discharge Instruction: Use triamcinolone 15 (g) to periwound Sween Lotion (Moisturizing lotion) Discharge Instruction: Apply moisturizing lotion as directed Topical Primary Dressing IODOFLEX 0.9% Cadexomer Iodine Pad 4x6 cm Discharge Instruction: Apply to wound bed as instructed Secondary Dressing Zetuvit Plus 4x8 in Discharge Instruction: Apply over primary dressing as directed. Secured With Compression Wrap ThreePress (3 layer compression wrap) Discharge Instruction: Apply three layer compression as directed. Compression Stockings Add-Ons Electronic Signature(s) Signed: 04/14/2021 4:36:32 PM By: Linton Ham MD Signed: 04/15/2021 6:28:13 PM By: Levan Hurst RN, BSN Entered By: Linton Ham on 04/14/2021 09:53:24 -------------------------------------------------------------------------------- Multi-Disciplinary Care Plan Details Patient  Name: Date of Service: MA [place], [name] NDA C. 04/14/2021 9:00 A M Medical Record Number: [id number] Patient Account Number: [id number] Date of Birth/Sex: Treating RN: 03-27-1950 (71 y.o. Carla Stokes Primary Care Tallin Hart: DO Seleta Rhymes, RO BERT Other Clinician: Referring Kristofer Schaffert: Treating Cassie Shedlock/Extender: Ok Edwards, RO BERT Weeks in Treatment: 1 Multidisciplinary Care Plan reviewed with physician Active Inactive Abuse / Safety / Falls / Self Care Management Nursing Diagnoses: History of Falls Potential for falls Goals: Patient/caregiver will verbalize/demonstrate measures taken to prevent injury and/or falls Date Initiated: 04/07/2021 Target Resolution Date: 05/05/2021 Goal Status: Active Interventions: Assess fall risk on admission and as needed Assess impairment of mobility on admission and as needed per policy Notes: Nutrition Nursing Diagnoses: Impaired glucose control: actual or potential Potential for alteratiion in Nutrition/Potential for imbalanced nutrition Goals: Patient/caregiver will maintain therapeutic glucose control Date Initiated: 04/07/2021 Target Resolution Date: 05/05/2021 Goal Status:  Active Interventions: Assess HgA1c results as ordered upon admission and as needed Assess patient nutrition upon admission and as needed per policy Provide education on elevated blood sugars and impact on wound healing Treatment Activities: Patient referred to Primary Care Physician for further nutritional evaluation : 04/07/2021 Notes: Venous Leg Ulcer Nursing Diagnoses: Knowledge deficit related to disease process and management Potential for venous Insuffiency (use before diagnosis confirmed) Goals: Patient will maintain optimal edema control Date Initiated: 04/07/2021 Target Resolution Date: 05/05/2021 Goal Status: Active Interventions: Assess peripheral edema status every visit. Compression as ordered Provide education on venous insufficiency Treatment  Activities: Therapeutic compression applied : 04/07/2021 Notes: Wound/Skin Impairment Nursing Diagnoses: Impaired tissue integrity Knowledge deficit related to ulceration/compromised skin integrity Goals: Patient/caregiver will verbalize understanding of skin care regimen Date Initiated: 04/07/2021 Date Inactivated: 04/14/2021 Target Resolution Date: 05/05/2021 Goal Status: Met Ulcer/skin breakdown will have a volume reduction of 30% by week 4 Date Initiated: 04/07/2021 Target Resolution Date: 05/05/2021 Goal Status: Active Interventions: Assess patient/caregiver ability to obtain necessary supplies Assess patient/caregiver ability to perform ulcer/skin care regimen upon admission and as needed Assess ulceration(s) every visit Provide education on ulcer and skin care Notes: Electronic Signature(s) Signed: 04/14/2021 5:30:32 PM By: Deon Pilling RN, BSN Entered By: Deon Pilling on 04/14/2021 09:12:28 -------------------------------------------------------------------------------- Pain Assessment Details Patient Name: Date of Service: [name], [name] NDA C. 04/14/2021 9:00 [place] Record Number: [id number] Patient Account Number: [id number] Date of Birth/Sex: Treating RN: 1950-12-04 (71 y.o. Carla Stokes Primary Care Yesmin Mutch: DO UGH, RO BERT Other Clinician: Referring Jaxtyn Linville: Treating Jacalyn Biggs/Extender: Ok Edwards, RO BERT Weeks in Treatment: 1 Active Problems Location of Pain Severity and Description of Pain Patient Has Paino No Site Locations Rate the pain. Current Pain Level: 0 Pain Management and Medication Current Pain Management: Medication: No Cold Application: No Rest: No Massage: No Activity: No T.E.N.S.: No Heat Application: No Leg drop or elevation: No Is the Current Pain Management Adequate: Adequate How does your wound impact your activities of daily livingo Sleep: No Bathing: No Appetite: No Relationship With Others: No Bladder  Continence: No Emotions: No Bowel Continence: No Work: No Toileting: No Drive: No Dressing: No Hobbies: No Engineer, maintenance) Signed: 04/14/2021 5:30:32 PM By: Deon Pilling RN, BSN Entered By: Deon Pilling on 04/14/2021 09:07:23 -------------------------------------------------------------------------------- Patient/Caregiver Education Details Patient Name: Date of Service: MA Merian Capron NDA C. 2/15/2023andnbsp9:00 [place] Record Number: [id number] Patient Account Number: [id number] Date of Birth/Gender: Treating RN: October 03, 1950 (71 y.o. Carla Stokes Primary Care Physician: DO Seleta Rhymes, RO BERT Other Clinician: Referring Physician: Treating Physician/Extender: Ok Edwards, RO BERT Weeks in Treatment: 1 Education Assessment Education Provided To: Patient Education Topics Provided Wound/Skin Impairment: Handouts: Skin Care Do's and Dont's Methods: Explain/Verbal Responses: Reinforcements needed Electronic Signature(s) Signed: 04/14/2021 5:30:32 PM By: Deon Pilling RN, BSN Entered By: Deon Pilling on 04/14/2021 09:12:48 -------------------------------------------------------------------------------- Wound Assessment Details Patient Name: Date of Service: [name], [name] NDA C. 04/14/2021 9:00 A M Medical Record Number: [id number] Patient Account Number: [id number] Date of Birth/Sex: Treating RN: [birth year] (71 y.o. Carla Stokes Primary Care Irine Heminger: DO Seleta Rhymes, RO BERT Other Clinician: Referring Willette Mudry: Treating Tomma Ehinger/Extender: Linton Ham DO UGH, RO BERT Weeks in Treatment: 1 Wound Status Wound Number: 1 Primary Diabetic Wound/Ulcer of the Lower Extremity Etiology: Wound Location: Left, Lateral Lower Leg Secondary Venous Leg Ulcer Wounding Event: Trauma Etiology: Date Acquired: 01/27/2021 Wound Status: Open Weeks Of Treatment: 1 Comorbid Cataracts, Hypertension, Type II Diabetes, Osteoarthritis, Clustered Wound: No  History:  Neuropathy Photos Wound Measurements Length: (cm) 2.5 Width: (cm) 1.5 Depth: (cm) 0.2 Area: (cm) 2.945 Volume: (cm) 0.589 % Reduction in Area: 0% % Reduction in Volume: -99.7% Epithelialization: Small (1-33%) Tunneling: No Undermining: No Wound Description Classification: Grade 1 Wound Margin: Flat and Intact Exudate Amount: Medium Exudate Type: Serosanguineous Exudate Color: red, brown Foul Odor After Cleansing: No Slough/Fibrino Yes Wound Bed Granulation Amount: Small (1-33%) Exposed Structure Granulation Quality: Red Fascia Exposed: No Necrotic Amount: Large (67-100%) Fat Layer (Subcutaneous Tissue) Exposed: Yes Necrotic Quality: Eschar, Adherent Slough Tendon Exposed: No Muscle Exposed: No Joint Exposed: No Bone Exposed: No Treatment Notes Wound #1 (Lower Leg) Wound Laterality: Left, Lateral Cleanser Peri-Wound Care Triamcinolone 15 (g) Discharge Instruction: Use triamcinolone 15 (g) to periwound Sween Lotion (Moisturizing lotion) Discharge Instruction: Apply moisturizing lotion as directed Topical Primary Dressing IODOFLEX 0.9% Cadexomer Iodine Pad 4x6 cm Discharge Instruction: Apply to wound bed as instructed Secondary Dressing Zetuvit Plus 4x8 in Discharge Instruction: Apply over primary dressing as directed. Secured With Compression Wrap ThreePress (3 layer compression wrap) Discharge Instruction: Apply three layer compression as directed. Compression Stockings Add-Ons Electronic Signature(s) Signed: 04/14/2021 5:30:32 PM By: Deon Pilling RN, BSN Entered By: Deon Pilling on 04/14/2021 09:15:31 -------------------------------------------------------------------------------- Vitals Details Patient Name: Date of Service: [name], [name] NDA C. 04/14/2021 9:00 A M Medical Record Number: [id number] Patient Account Number: [id number] Date of Birth/Sex: Treating RN: 1951/02/21 (71 y.o. Carla Stokes, Meta.Reding Primary Care Jeter Tomey: DO UGH, RO BERT Other  Clinician: Referring Taila Basinski: Treating Alesa Echevarria/Extender: Linton Ham DO UGH, RO BERT Weeks in Treatment: 1 Vital Signs Time Taken: 09:05 Temperature (F): 98.4 Height (in): 67 Pulse (bpm): 51 Respiratory Rate (breaths/min): 20 Blood Pressure (mmHg): 149/83 Capillary Blood Glucose (mg/dl): 156 Reference Range: 80 - 120 mg / dl Electronic Signature(s) Signed: 04/14/2021 5:30:32 PM By: Deon Pilling RN, BSN Entered By: Deon Pilling on 04/14/2021 09:07:14

## 2021-04-20 ENCOUNTER — Encounter (HOSPITAL_BASED_OUTPATIENT_CLINIC_OR_DEPARTMENT_OTHER): Payer: Medicare HMO | Admitting: General Surgery

## 2021-04-20 ENCOUNTER — Other Ambulatory Visit: Payer: Self-pay

## 2021-04-20 DIAGNOSIS — S80812A Abrasion, left lower leg, initial encounter: Secondary | ICD-10-CM | POA: Diagnosis not present

## 2021-04-20 NOTE — Progress Notes (Signed)
Carla Stokes, Carla C. ([id number]) Visit Report for 04/20/2021 Chief Complaint Document Details Patient Name: Date of Service: [place] Carla Stokes, [place] NDA C. 04/20/2021 7:30 A M Medical Record Number: [id number] Patient Account Number: [id number] Date of Birth/Sex: Treating RN: 07/04/1950 (71 y.o. Carla Stokes) Boehlein, Linda Primary Care Provider: Dina Richough, Robert Other Clinician: Referring Provider: Treating Provider/Extender: Pincus Badderannon, Aristotelis Vilardi Dough, Robert Weeks in Treatment: 1 Information Obtained from: Patient Chief Complaint 04/20/2021; patient is here for review of wound on the left lateral lower leg Electronic Signature(s) Signed: 04/20/2021 8:12:43 AM By: Duanne Guessannon, Mckenna Boruff MD, FACS Entered By: Duanne Guessannon, Jazmen Lindenbaum on 04/20/2021 08:12:42 -------------------------------------------------------------------------------- Debridement Details Patient Name: Date of Service: MA Carla Stokes, [place] NDA C. 04/20/2021 7:30 A M Medical Record Number: [id number] Patient Account Number: [id number] Date of Birth/Sex: Treating RN: 12/05/1950 [age] y.o. Carla Stokes) Boehlein, Linda Primary Care Provider: Dina Richough, Robert Other Clinician: Referring Provider: Treating Provider/Extender: Pincus Badderannon, Guinn Delarosa Dough, Robert Weeks in Treatment: 1 Debridement Performed for Assessment: Wound #1 Left,Lateral Lower Leg Performed By: Physician Duanne Guessannon, Deborh Pense, MD Debridement Type: Debridement Severity of Tissue Pre Debridement: Fat layer exposed Level of Consciousness (Pre-procedure): Awake and Alert Pre-procedure Verification/Time Out Yes - 08:05 Taken: Start Time: 08:07 Pain Control: Lidocaine 4% T opical Solution T Area Debrided (L x W): otal 1.7 (cm) x 1.5 (cm) = 2.55 (cm) Tissue and other material debrided: Viable, Non-Viable, Slough, Subcutaneous, Slough Level: Skin/Subcutaneous Tissue Debridement Description: Excisional Instrument: Curette Bleeding: Minimum Hemostasis Achieved: Pressure Procedural Pain: 4 Post Procedural Pain: 2 Response to  Treatment: Procedure was tolerated well Level of Consciousness (Post- Awake and Alert procedure): Post Debridement Measurements of Total Wound Length: (cm) 1.7 Width: (cm) 1.5 Depth: (cm) 0.1 Volume: (cm) 0.2 Character of Wound/Ulcer Post Debridement: Improved Severity of Tissue Post Debridement: Fat layer exposed Post Procedure Diagnosis Same as Pre-procedure Electronic Signature(s) Signed: 04/20/2021 10:59:01 AM By: Duanne Guessannon, Kevaughn Ewing MD, FACS Signed: 04/20/2021 6:04:33 PM By: Zenaida DeedBoehlein, Linda RN, BSN Entered By: Zenaida DeedBoehlein, Linda on 04/20/2021 08:08:25 -------------------------------------------------------------------------------- HPI Details Patient Name: Date of Service: [place] Carla Stokes, [place] NDA C. 04/20/2021 7:30 A M Medical Record Number: [id number] Patient Account Number: [id number] Date of Birth/Sex: Treating RN: 07/01/1950 [age] y.o. Carla Stokes) Boehlein, Linda Primary Care Provider: Dina Richough, Robert Other Clinician: Referring Provider: Treating Provider/Extender: Pincus Badderannon, Derryck Shahan Dough, Robert Weeks in Treatment: 1 History of Present Illness HPI Description: ADMISSION 04/07/2021 This is a 71 year old woman with type 2 diabetes. She states her problem began towards the end of November when she had a fall winter in the yard and injured her left lower leg. She has [facility] [place] Saw them on 03/02/2021 she was felt to have cellulitis of the left ankle and the left leg wound. She ultimately was felt to have a venous stasis ulcer and I believe was put in an [employer] boot. She is not wearing that currently. She had several further visits. The wound became increasingly painful and on 04/01/2021 she went to the emergency room. An x-ray of the area showed vascular calcification but no bony abnormality. She was put on doxycycline which she is still taking. She does not have a history of prior wounds or venous insufficiency that she knows about. Past medical history includes type 2  diabetes with a A1c of 7.5 in October, hypertension, gout, hypercholesterolemia, diabetic foot ulcer and a history of PAD The patient follows with vascular surgery in [place]. He was able to see that she has previously had vascular evaluation most recently in March 2022. Although I do not have the  exact numbers it is quoted in their last note it is showing noncompressible ABIs but normal TBI's and normal waveforms. As such there should not be any difficulty with sufficient blood flow to heal this wound. 2/15; 2 small areas which were initially traumatic but then complicated by infection. We have been using Iodoflex. Both wounds had a very necrotic surface last week. 04/20/2021: 2 small areas that were initially traumatic and then complicated by infection. Iodoflex has been used with clear improvement. The wounds continue to contract and are smaller again today without any necrosis, but with adherent slough. Electronic Signature(s) Signed: 04/20/2021 8:14:40 AM By: Duanne Guess MD, FACS Entered By: Duanne Guess on 04/20/2021 08:14:39 -------------------------------------------------------------------------------- Physical Exam Details Patient Name: Date of Service: MA Carla Slate, [name]. 04/20/2021 7:30 A M Medical Record Number: [id number] Patient Account Number: [id number] Date of Birth/Sex: Treating RN: 1950/04/25 (71 y.o. Carla Standard Primary Care Provider: Dina Rich Other Clinician: Referring Provider: Treating Provider/Extender: Pincus Badder in Treatment: 1 Constitutional . . She is obese.. . Respiratory Normal work of breathing on room air. Cardiovascular Dorsalis pedis pulses not palpable bilaterally. Trace pretibial edema on the right leg. Integumentary (Hair, Skin) There are 2 small lesions on the lateral left leg. These have contracted since her last visit and show good evidence of healing skin around the margins. There is yellow slough  present in the bases of both, but no necrosis, erythema, or induration. Very minimal drainage.. Electronic Signature(s) Signed: 04/20/2021 8:18:05 AM By: Duanne Guess MD, FACS Entered By: Duanne Guess on 04/20/2021 08:18:05 -------------------------------------------------------------------------------- Physician Orders Details Patient Name: Date of Service: MA Carla Slate, [name]. 04/20/2021 7:30 A M Medical Record Number: [id number] Patient Account Number: [id number] Date of Birth/Sex: Treating RN: Apr 29, 1950 (71 y.o. Carla Standard Primary Care Provider: Dina Rich Other Clinician: Referring Provider: Treating Provider/Extender: Pincus Badder in Treatment: 1 Verbal / Phone Orders: No Diagnosis Coding Follow-up Appointments ppointment in 1 week. - Dr. Leanord Hawking - room 7 Return A Bathing/ Shower/ Hygiene May shower with protection but do not get wound dressing(s) wet. Edema Control - Lymphedema / SCD / Other Left Lower Extremity Elevate legs to the level of the heart or above for 30 minutes daily and/or when sitting, a frequency of: - 3-4 times a day throughout the day. Avoid standing for long periods of time. Exercise regularly Moisturize legs daily. - right leg every night before bed. Compression stocking or Garment 20-30 mm/Hg pressure to: - right leg daily Wound Treatment Wound #1 - Lower Leg Wound Laterality: Left, Lateral Peri-Wound Care: Sween Lotion (Moisturizing lotion) 1 x Per Week Discharge Instructions: Apply moisturizing lotion as directed Prim Dressing: IODOFLEX 0.9% Cadexomer Iodine Pad 4x6 cm 1 x Per Week ary Discharge Instructions: Apply to wound bed as instructed Secondary Dressing: Woven Gauze Sponge, Non-Sterile 4x4 in 1 x Per Week Discharge Instructions: Apply over primary dressing as directed. Compression Wrap: ThreePress (3 layer compression wrap) 1 x Per Week Discharge Instructions: Apply three layer compression as  directed. Electronic Signature(s) Signed: 04/20/2021 8:19:04 AM By: Duanne Guess MD, FACS Entered By: Duanne Guess on 04/20/2021 08:19:04 -------------------------------------------------------------------------------- Problem List Details Patient Name: Date of Service: MA Carla Slate, [name]. 04/20/2021 7:30 A M Medical Record Number: [id number] Patient Account Number: [id number] Date of Birth/Sex: Treating RN: 1950-12-29 (71 y.o. Carla Standard Primary Care Provider: Dina Rich Other Clinician: Referring Provider: Treating Provider/Extender: Pincus Badder in Treatment: 1 Active Problems ICD-10 Encounter  Code Description Active Date MDM Diagnosis S80.812D Abrasion, left lower leg, subsequent encounter 04/07/2021 No Yes L97.828 Non-pressure chronic ulcer of other part of left lower leg with other specified 04/07/2021 No Yes severity I87.332 Chronic venous hypertension (idiopathic) with ulcer and inflammation of left 04/07/2021 No Yes lower extremity Inactive Problems Resolved Problems Electronic Signature(s) Signed: 04/20/2021 8:11:41 AM By: Duanne Guessannon, Ashkan Chamberland MD, FACS Entered By: Duanne Guessannon, Kalana Yust on 04/20/2021 08:11:40 -------------------------------------------------------------------------------- Progress Note Details Patient Name: Date of Service: MA Carla Stokes, [place] NDA C. 04/20/2021 7:30 A M Medical Record Number: [id number] Patient Account Number: [id number] Date of Birth/Sex: Treating RN: 07/08/1950 [age] y.o. Carla Stokes) Boehlein, Linda Primary Care Provider: Dina Richough, Robert Other Clinician: Referring Provider: Treating Provider/Extender: Pincus Badderannon, Janari Gagner Dough, Robert Weeks in Treatment: 1 Subjective Chief Complaint Information obtained from Patient 04/20/2021; patient is here for review of wound on the left lateral lower leg History of Present Illness (HPI) ADMISSION 04/07/2021 This is a 71 year old woman with type 2 diabetes. She states her problem began  towards the end of November when she had a fall winter in the yard and injured her left lower leg. She has [facility] [place]. Saw them on 03/02/2021 she was felt to have cellulitis of the left ankle and the left leg wound. She ultimately was felt to have a venous stasis ulcer and I believe was put in an [employer] boot. She is not wearing that currently. She had several further visits. The wound became increasingly painful and on 04/01/2021 she went to the emergency room. An x-ray of the area showed vascular calcification but no bony abnormality. She was put on doxycycline which she is still taking. She does not have a history of prior wounds or venous insufficiency that she knows about. Past medical history includes type 2 diabetes with a A1c of 7.5 in October, hypertension, gout, hypercholesterolemia, diabetic foot ulcer and a history of PAD The patient follows with vascular surgery in [place]. He was able to see that she has previously had vascular evaluation most recently in March 2022. Although I do not have the exact numbers it is quoted in their last note it is showing noncompressible ABIs but normal TBI's and normal waveforms. As such there should not be any difficulty with sufficient blood flow to heal this wound. 2/15; 2 small areas which were initially traumatic but then complicated by infection. We have been using Iodoflex. Both wounds had a very necrotic surface last week. 04/20/2021: 2 small areas that were initially traumatic and then complicated by infection. Iodoflex has been used with clear improvement. The wounds continue to contract and are smaller again today without any necrosis, but with adherent slough. Patient History Information obtained from Patient, Chart. Family History Cancer - Siblings,Father, Diabetes - Siblings, Heart Disease - Siblings,Mother, Hypertension - Siblings,Mother, Lung Disease - Siblings, Stroke - Siblings,Mother, Thyroid  Problems - Siblings, No family history of Hereditary Spherocytosis, Kidney Disease, Seizures, Tuberculosis. Social History Never smoker, Marital Status - Divorced, Alcohol Use - Rarely, Drug Use - No History, Caffeine Use - Moderate - coffee. Medical History Eyes Patient has history of Cataracts - left eye removed Denies history of Glaucoma Cardiovascular Patient has history of Hypertension Endocrine Patient has history of Type II Diabetes Denies history of Type I Diabetes Integumentary (Skin) Denies history of History of Burn Musculoskeletal Patient has history of Osteoarthritis Denies history of Gout, Rheumatoid Arthritis, Osteomyelitis Neurologic Patient has history of Neuropathy Oncologic Denies history of Received Chemotherapy, Received Radiation Psychiatric Denies  history of Anorexia/bulimia, Confinement Anxiety Hospitalization/Surgery History - left cataract extraction. - c-section. - total abdominal hysterectomy w/ bil salpingoophrectomy. - wisdom tooth extraction. Medical A Surgical History Notes nd Constitutional Symptoms (General Health) obesity Cardiovascular hyperlipidemia Objective Constitutional She is obese.. Vitals Time Taken: 7:42 AM, Height: 67 in, Source: Stated, Source: Stated, Temperature: 98.3 F, Pulse: 57 bpm, Respiratory Rate: 18 breaths/min, Blood Pressure: 148/83 mmHg, Capillary Blood Glucose: 136 mg/dl. General Notes: glucose per pt report yesterday morning Respiratory Normal work of breathing on room air. Cardiovascular Dorsalis pedis pulses not palpable bilaterally. Trace pretibial edema on the right leg. Integumentary (Hair, Skin) There are 2 small lesions on the lateral left leg. These have contracted since her last visit and show good evidence of healing skin around the margins. There is yellow slough present in the bases of both, but no necrosis, erythema, or induration. Very minimal drainage.. Wound #1 status is Open. Original cause of  wound was Trauma. The date acquired was: 01/27/2021. The wound has been in treatment 1 weeks. The wound is located on the Left,Lateral Lower Leg. The wound measures 1.7cm length x 1.5cm width x 0.1cm depth; 2.003cm^2 area and 0.2cm^3 volume. There is Fat Layer (Subcutaneous Tissue) exposed. There is no tunneling or undermining noted. There is a medium amount of serosanguineous drainage noted. The wound margin is flat and intact. There is medium (34-66%) pink granulation within the wound bed. There is a medium (34-66%) amount of necrotic tissue within the wound bed including Adherent Slough. Assessment Active Problems ICD-10 Abrasion, left lower leg, subsequent encounter Non-pressure chronic ulcer of other part of left lower leg with other specified severity Chronic venous hypertension (idiopathic) with ulcer and inflammation of left lower extremity Procedures Wound #1 Pre-procedure diagnosis of Wound #1 is a Diabetic Wound/Ulcer of the Lower Extremity located on the Left,Lateral Lower Leg .Severity of Tissue Pre Debridement is: Fat layer exposed. There was a Excisional Skin/Subcutaneous Tissue Debridement with a total area of 2.55 sq cm performed by Duanne Guess, MD. With the following instrument(s): Curette to remove Viable and Non-Viable tissue/material. Material removed includes Subcutaneous Tissue and Slough and after achieving pain control using Lidocaine 4% T opical Solution. No specimens were taken. A time out was conducted at 08:05, prior to the start of the procedure. A Minimum amount of bleeding was controlled with Pressure. The procedure was tolerated well with a pain level of 4 throughout and a pain level of 2 following the procedure. Post Debridement Measurements: 1.7cm length x 1.5cm width x 0.1cm depth; 0.2cm^3 volume. Character of Wound/Ulcer Post Debridement is improved. Severity of Tissue Post Debridement is: Fat layer exposed. Post procedure Diagnosis Wound #1: Same as  Pre-Procedure Pre-procedure diagnosis of Wound #1 is a Diabetic Wound/Ulcer of the Lower Extremity located on the Left,Lateral Lower Leg . There was a Three Layer Compression Therapy Procedure by Zenaida Deed, RN. Post procedure Diagnosis Wound #1: Same as Pre-Procedure Plan Follow-up Appointments: Return Appointment in 1 week. - Dr. Leanord Hawking - room 7 Bathing/ Shower/ Hygiene: May shower with protection but do not get wound dressing(s) wet. Edema Control - Lymphedema / SCD / Other: Elevate legs to the level of the heart or above for 30 minutes daily and/or when sitting, a frequency of: - 3-4 times a day throughout the day. Avoid standing for long periods of time. Exercise regularly Moisturize legs daily. - right leg every night before bed. Compression stocking or Garment 20-30 mm/Hg pressure to: - right leg daily WOUND #1: - Lower Leg  Wound Laterality: Left, Lateral Peri-Wound Care: Sween Lotion (Moisturizing lotion) 1 x Per Week/ Discharge Instructions: Apply moisturizing lotion as directed Prim Dressing: IODOFLEX 0.9% Cadexomer Iodine Pad 4x6 cm 1 x Per Week/ ary Discharge Instructions: Apply to wound bed as instructed Secondary Dressing: Woven Gauze Sponge, Non-Sterile 4x4 in 1 x Per Week/ Discharge Instructions: Apply over primary dressing as directed. Com pression Wrap: ThreePress (3 layer compression wrap) 1 x Per Week/ Discharge Instructions: Apply three layer compression as directed. I performed debridement of both small lesions today using a #3 curette. Adherent slough was removed. Good bleeding tissue was identified and bleeding was controlled with direct pressure. We will continue with Iodoflex and compression and I will see her again in 1 week. Electronic Signature(s) Signed: 04/20/2021 8:20:20 AM By: Duanne Guess MD, FACS Entered By: Duanne Guess on 04/20/2021 08:20:19 -------------------------------------------------------------------------------- HxROS  Details Patient Name: Date of Service: MA TTHEWS, [name]. 04/20/2021 7:30 A M Medical Record Number: [id number] Patient Account Number: [id number] Date of Birth/Sex: Treating RN: 01/28/1951 (71 y.o. Carla Standard Primary Care Provider: Dina Rich Other Clinician: Referring Provider: Treating Provider/Extender: Pincus Badder in Treatment: 1 Information Obtained From Patient Chart Constitutional Symptoms (General Health) Medical History: Past Medical History Notes: obesity Eyes Medical History: Positive for: Cataracts - left eye removed Negative for: Glaucoma Cardiovascular Medical History: Positive for: Hypertension Past Medical History Notes: hyperlipidemia Endocrine Medical History: Positive for: Type II Diabetes Negative for: Type I Diabetes Time with diabetes: >15 years Treated with: Insulin, Oral agents Blood sugar tested every day: Yes Tested : Integumentary (Skin) Medical History: Negative for: History of Burn Musculoskeletal Medical History: Positive for: Osteoarthritis Negative for: Gout; Rheumatoid Arthritis; Osteomyelitis Neurologic Medical History: Positive for: Neuropathy Oncologic Medical History: Negative for: Received Chemotherapy; Received Radiation Psychiatric Medical History: Negative for: Anorexia/bulimia; Confinement Anxiety HBO Extended History Items Eyes: Cataracts Immunizations Pneumococcal Vaccine: Received Pneumococcal Vaccination: Yes Received Pneumococcal Vaccination On or After 60th Birthday: Yes Implantable Devices None Hospitalization / Surgery History Type of Hospitalization/Surgery left cataract extraction c-section total abdominal hysterectomy w/ bil salpingoophrectomy wisdom tooth extraction Family and Social History Cancer: Yes - Siblings,Father; Diabetes: Yes - Siblings; Heart Disease: Yes - Siblings,Mother; Hereditary Spherocytosis: No; Hypertension: Yes - Siblings,Mother; Kidney  Disease: No; Lung Disease: Yes - Siblings; Seizures: No; Stroke: Yes - Siblings,Mother; Thyroid Problems: Yes - Siblings; Tuberculosis: No; Never smoker; Marital Status - Divorced; Alcohol Use: Rarely; Drug Use: No History; Caffeine Use: Moderate - coffee; Financial Concerns: No; Food, Clothing or Shelter Needs: No; Support System Lacking: No; Transportation Concerns: No Physician Affirmation I have reviewed and agree with the above information. Electronic Signature(s) Signed: 04/20/2021 10:59:01 AM By: Duanne Guess MD, FACS Signed: 04/20/2021 6:04:33 PM By: Zenaida Deed RN, BSN Entered By: Duanne Guess on 04/20/2021 08:15:00 -------------------------------------------------------------------------------- SuperBill Details Patient Name: Date of Service: MA Carla Slate, [name]. 04/20/2021 Medical Record Number: [id number] Patient Account Number: [id number] Date of Birth/Sex: Treating RN: [birth year] (71 y.o. Carla Standard Primary Care Provider: Dina Rich Other Clinician: Referring Provider: Treating Provider/Extender: Pincus Badder in Treatment: 1 Diagnosis Coding ICD-10 Codes Code Description [phone] Abrasion, left lower leg, subsequent encounter L97.828 Non-pressure chronic ulcer of other part of left lower leg with other specified severity I87.332 Chronic venous hypertension (idiopathic) with ulcer and inflammation of left lower extremity Facility Procedures CPT4 Code: [id number] Description: 11042 - DEB SUBQ TISSUE 20 SQ CM/< ICD-10 Diagnosis Description L97.828 Non-pressure chronic ulcer of other part of left lower leg  with other specified Modifier: severity Quantity: 1 Physician Procedures : CPT4 Code Description Modifier [id number] 11042 - WC PHYS SUBQ TISS 20 SQ CM ICD-10 Diagnosis Description L97.828 Non-pressure chronic ulcer of other part of left lower leg with other specified severity Quantity: 1 Electronic Signature(s) Signed: 04/20/2021  8:20:50 AM By: Duanne Guess MD, FACS Entered By: Duanne Guess on 04/20/2021 08:20:50

## 2021-04-21 NOTE — Progress Notes (Signed)
Carla Stokes, Carla Stokes ([id number]) Visit Report for 04/20/2021 Arrival Information Details Patient Name: Date of Service: MA [place], [name] NDA C. 04/20/2021 7:30 A M Medical Record Number: [id number] Patient Account Number: [id number] Date of Birth/Sex: Treating RN: 02/07/1951 (71 y.o. Carla Stokes Carla Stokes: Carla Stokes Other Clinician: Referring Carla Stokes: Treating Carla Stokes/Extender: Carla Stokes in Treatment: 1 Visit Information History Since Last Visit Added or deleted any medications: No Patient Arrived: Ambulatory Any new allergies or adverse reactions: No Arrival Time: 07:41 Had a fall or experienced change in No Accompanied By: self activities of daily living that may affect Transfer Assistance: None risk of falls: Patient Requires Transmission-Based Precautions: No Signs or symptoms of abuse/neglect since last visito No Patient Has Alerts: Yes Hospitalized since last visit: No Patient Alerts: Bil ABIs N/C, TBIs WNL Implantable device outside of the clinic excluding No cellular tissue based products placed in the center since last visit: Has Dressing in Place as Prescribed: Yes Has Compression in Place as Prescribed: Yes Pain Present Now: No Electronic Signature(s) Signed: 04/20/2021 6:04:33 PM By: Carla Gouty RN, BSN Entered By: Carla Stokes on 04/20/2021 07:42:54 -------------------------------------------------------------------------------- Compression Therapy Details Patient Name: Date of Service: MA Carla Stokes, Carla Stokes NDA C. 04/20/2021 7:30 A M Medical Record Number: [id number] Patient Account Number: [id number] Date of Birth/Sex: Treating RN: 11/05/1950 (71 y.o. Carla Stokes Carla Stokes: Carla Stokes Other Clinician: Referring Carla Stokes: Treating Carla Stokes/Extender: Carla Stokes in Treatment: 1 Compression Therapy Performed for Wound Assessment: Wound #1 Left,Lateral Stokes Leg Performed  By: Clinician Carla Gouty, RN Compression Type: Three Layer Post Procedure Diagnosis Same as Pre-procedure Electronic Signature(s) Signed: 04/20/2021 6:04:33 PM By: Carla Gouty RN, BSN Entered By: Carla Stokes on 04/20/2021 08:05:10 -------------------------------------------------------------------------------- Encounter Discharge Information Details Patient Name: Date of Service: MA Carla Stokes, [name] NDA C. 04/20/2021 7:30 A M Medical Record Number: [id number] Patient Account Number: [id number] Date of Birth/Sex: Treating RN: 08/12/1950 (71 y.o. Carla Stokes Carla Stokes: Carla Stokes Other Clinician: Referring Carla Stokes: Treating Carla Stokes/Extender: Carla Stokes in Treatment: 1 Encounter Discharge Information Items Post Procedure Vitals Discharge Condition: Stable Temperature (F): 98.3 Ambulatory Status: Ambulatory Pulse (bpm): 57 Discharge Destination: Home Respiratory Rate (breaths/min): 18 Transportation: Private Auto Blood Pressure (mmHg): 148/83 Accompanied By: self Schedule Follow-up Appointment: Yes Clinical Summary of Stokes: Patient Declined Electronic Signature(s) Signed: 04/20/2021 6:04:33 PM By: Carla Gouty RN, BSN Entered By: Carla Stokes on 04/20/2021 08:21:06 -------------------------------------------------------------------------------- Stokes Extremity Assessment Details Patient Name: Date of Service: [name], [name] NDA C. 04/20/2021 7:30 A M Medical Record Number: [id number] Patient Account Number: [id number] Date of Birth/Sex: Treating RN: 12/14/1950 (71 y.o. Carla Stokes Carla Stokes: Carla Stokes Other Clinician: Referring Carla Stokes: Treating Carla Stokes/Extender: Carla Stokes in Treatment: 1 Edema Assessment Assessed: [Left: No] [Right: No] Edema: [Left: Ye] [Right: s] Calf Left: Right: Point of Measurement: From Medial Instep 36 cm Ankle Left: Right: Point of  Measurement: From Medial Instep 22.5 cm Vascular Assessment Pulses: Dorsalis Pedis Palpable: [Left:Yes] Electronic Signature(s) Signed: 04/20/2021 6:04:33 PM By: Carla Gouty RN, BSN Entered By: Carla Stokes on 04/20/2021 07:50:53 -------------------------------------------------------------------------------- Multi Wound Chart Details Patient Name: Date of Service: MA Carla Stokes, Carla Stokes NDA C. 04/20/2021 7:30 A M Medical Record Number: [id number] Patient Account Number: [id number] Date of Birth/Sex: Treating RN: [birth year] (71 y.o. Carla Stokes Carla Stokes: Carla Stokes Other Clinician: Referring Carla Stokes: Treating Carla Stokes/Extender: Carla Stokes in Treatment: 1 Vital Signs Height(in): 67 Capillary Blood  Glucose(mg/dl): 136 Weight(lbs): Pulse(bpm): 57 Body Mass Index(BMI): Blood Pressure(mmHg): 148/83 Temperature(F): 98.3 Respiratory Rate(breaths/min): 18 Photos: [N/A:N/A] Left, Lateral Stokes Leg N/A N/A Wound Location: Trauma N/A N/A Wounding Event: Diabetic Wound/Ulcer of the Stokes N/A N/A Primary Etiology: Extremity Venous Leg Ulcer N/A N/A Secondary Etiology: Cataracts, Hypertension, Type II N/A N/A Comorbid History: Diabetes, Osteoarthritis, Neuropathy 01/27/2021 N/A N/A Date Acquired: 1 N/A N/A Weeks of Treatment: Open N/A N/A Wound Status: No N/A N/A Wound Recurrence: 1.7x1.5x0.1 N/A N/A Measurements L x W x D (cm) 2.003 N/A N/A A (cm) : rea 0.2 N/A N/A Volume (cm) : 32.00% N/A N/A % Reduction in A rea: 32.20% N/A N/A % Reduction in Volume: Grade 1 N/A N/A Classification: Medium N/A N/A Exudate A mount: Serosanguineous N/A N/A Exudate Type: red, brown N/A N/A Exudate Color: Flat and Intact N/A N/A Wound Margin: Medium (34-66%) N/A N/A Granulation A mount: Pink N/A N/A Granulation Quality: Medium (34-66%) N/A N/A Necrotic A mount: Fat Layer (Subcutaneous Tissue): Yes N/A N/A Exposed  Structures: Fascia: No Tendon: No Muscle: No Joint: No Bone: No Small (1-33%) N/A N/A Epithelialization: Debridement - Excisional N/A N/A Debridement: Pre-procedure Verification/Time Out 08:05 N/A N/A Taken: Lidocaine 4% Topical Solution N/A N/A Pain Control: Subcutaneous, Slough N/A N/A Tissue Debrided: Skin/Subcutaneous Tissue N/A N/A Level: 2.55 N/A N/A Debridement A (sq cm): rea Curette N/A N/A Instrument: Minimum N/A N/A Bleeding: Pressure N/A N/A Hemostasis A chieved: 4 N/A N/A Procedural Pain: 2 N/A N/A Post Procedural Pain: Procedure was tolerated well N/A N/A Debridement Treatment Response: 1.7x1.5x0.1 N/A N/A Post Debridement Measurements L x W x D (cm) 0.2 N/A N/A Post Debridement Volume: (cm) Compression Therapy N/A N/A Procedures Performed: Debridement Treatment Notes Electronic Signature(s) Signed: 04/20/2021 8:12:10 AM By: Fredirick Maudlin MD, FACS Signed: 04/20/2021 6:04:33 PM By: Carla Gouty RN, BSN Entered By: Fredirick Maudlin on 04/20/2021 08:12:10 -------------------------------------------------------------------------------- Multi-Disciplinary Stokes Plan Details Patient Name: Date of Service: MA [place], [name] NDA C. 04/20/2021 7:30 A M Medical Record Number: [id number] Patient Account Number: [id number] Date of Birth/Sex: Treating RN: 1950/03/20 (71 y.o. Carla Stokes Martyn Timme: Carla Stokes Other Clinician: Referring Conception Doebler: Treating Jadalee Westcott/Extender: Carla Stokes in Treatment: 1 Multidisciplinary Stokes Plan reviewed with physician Active Inactive Abuse / Safety / Falls / Self Stokes Management Nursing Diagnoses: History of Falls Potential for falls Goals: Patient/caregiver will verbalize/demonstrate measures taken to prevent injury and/or falls Date Initiated: 04/07/2021 Target Resolution Date: 05/05/2021 Goal Status: Active Interventions: Assess fall risk on admission and as  needed Assess impairment of mobility on admission and as needed per policy Notes: Nutrition Nursing Diagnoses: Impaired glucose control: actual or potential Potential for alteratiion in Nutrition/Potential for imbalanced nutrition Goals: Patient/caregiver will maintain therapeutic glucose control Date Initiated: 04/07/2021 Target Resolution Date: 05/05/2021 Goal Status: Active Interventions: Assess HgA1c results as ordered upon admission and as needed Assess patient nutrition upon admission and as needed per policy Provide education on elevated blood sugars and impact on wound healing Treatment Activities: Patient referred to Primary Stokes Physician for further nutritional evaluation : 04/07/2021 Notes: Venous Leg Ulcer Nursing Diagnoses: Knowledge deficit related to disease process and management Potential for venous Insuffiency (use before diagnosis confirmed) Goals: Patient will maintain optimal edema control Date Initiated: 04/07/2021 Target Resolution Date: 05/05/2021 Goal Status: Active Interventions: Assess peripheral edema status every visit. Compression as ordered Provide education on venous insufficiency Treatment Activities: Therapeutic compression applied : 04/07/2021 Notes: Wound/Skin Impairment Nursing Diagnoses: Impaired tissue integrity Knowledge deficit related to ulceration/compromised skin  integrity Goals: Patient/caregiver will verbalize understanding of skin Stokes regimen Date Initiated: 04/07/2021 Date Inactivated: 04/14/2021 Target Resolution Date: 05/05/2021 Goal Status: Met Ulcer/skin breakdown will have a volume reduction of 30% by week 4 Date Initiated: 04/07/2021 Target Resolution Date: 05/05/2021 Goal Status: Active Interventions: Assess patient/caregiver ability to obtain necessary supplies Assess patient/caregiver ability to perform ulcer/skin Stokes regimen upon admission and as needed Assess ulceration(s) every visit Provide education on ulcer and skin  Stokes Notes: Electronic Signature(s) Signed: 04/20/2021 6:04:33 PM By: Carla Gouty RN, BSN Entered By: Carla Stokes on 04/20/2021 07:56:44 -------------------------------------------------------------------------------- Pain Assessment Details Patient Name: Date of Service: MA Carla Stokes, Carla Stokes NDA C. 04/20/2021 7:30 A M Medical Record Number: [id number] Patient Account Number: [id number] Date of Birth/Sex: Treating RN: Mar 19, 1950 (71 y.o. Carla Stokes Hudson Lehmkuhl: Carla Stokes Other Clinician: Referring Ambrielle Kington: Treating Chaia Ikard/Extender: Carla Stokes in Treatment: 1 Active Problems Location of Pain Severity and Description of Pain Patient Has Paino No Site Locations Rate the pain. Rate the pain. Current Pain Level: 0 Pain Management and Medication Current Pain Management: Electronic Signature(s) Signed: 04/20/2021 6:04:33 PM By: Carla Gouty RN, BSN Entered By: Carla Stokes on 04/20/2021 07:44:27 -------------------------------------------------------------------------------- Patient/Caregiver Education Details Patient Name: Date of Service: Dyanne Carrel NDA C. 2/21/2023andnbsp7:30 [place] Record Number: [id number] Patient Account Number: [id number] Date of Birth/Gender: Treating RN: 1950/09/20 (71 y.o. Carla Stokes Physician: Carla Stokes Other Clinician: Referring Physician: Treating Physician/Extender: Carla Stokes in Treatment: 1 Education Assessment Education Provided To: Patient Education Topics Provided Elevated Blood Sugar/ Impact on Healing: Methods: Explain/Verbal Responses: Reinforcements needed, State content correctly Venous: Methods: Explain/Verbal Responses: Reinforcements needed, State content correctly Electronic Signature(s) Signed: 04/20/2021 6:04:33 PM By: Carla Gouty RN, BSN Entered By: Carla Stokes on 04/20/2021  07:57:07 -------------------------------------------------------------------------------- Wound Assessment Details Patient Name: Date of Service: MA Carla Stokes, [name] NDA C. 04/20/2021 7:30 A M Medical Record Number: [id number] Patient Account Number: [id number] Date of Birth/Sex: Treating RN: 1950/07/26 (71 y.o. Carla Stokes Jazzmyn Filion: Carla Stokes Other Clinician: Referring Keil Pickering: Treating Kesler Wickham/Extender: Carla Stokes in Treatment: 1 Wound Status Wound Number: 1 Primary Diabetic Wound/Ulcer of the Stokes Extremity Etiology: Wound Location: Left, Lateral Stokes Leg Secondary Venous Leg Ulcer Wounding Event: Trauma Etiology: Date Acquired: 01/27/2021 Wound Status: Open Weeks Of Treatment: 1 Comorbid Cataracts, Hypertension, Type II Diabetes, Osteoarthritis, Clustered Wound: No History: Neuropathy Photos Wound Measurements Length: (cm) 1.7 Width: (cm) 1.5 Depth: (cm) 0.1 Area: (cm) 2.003 Volume: (cm) 0.2 % Reduction in Area: 32% % Reduction in Volume: 32.2% Epithelialization: Small (1-33%) Tunneling: No Undermining: No Wound Description Classification: Grade 1 Wound Margin: Flat and Intact Exudate Amount: Medium Exudate Type: Serosanguineous Exudate Color: red, brown Foul Odor After Cleansing: No Slough/Fibrino Yes Wound Bed Granulation Amount: Medium (34-66%) Exposed Structure Granulation Quality: Pink Fascia Exposed: No Necrotic Amount: Medium (34-66%) Fat Layer (Subcutaneous Tissue) Exposed: Yes Necrotic Quality: Adherent Slough Tendon Exposed: No Muscle Exposed: No Joint Exposed: No Bone Exposed: No Treatment Notes Wound #1 (Stokes Leg) Wound Laterality: Left, Lateral Cleanser Peri-Wound Stokes Sween Lotion (Moisturizing lotion) Discharge Instruction: Apply moisturizing lotion as directed Topical Primary Dressing IODOFLEX 0.9% Cadexomer Iodine Pad 4x6 cm Discharge Instruction: Apply to wound bed as  instructed Secondary Dressing Woven Gauze Sponge, Non-Sterile 4x4 in Discharge Instruction: Apply over primary dressing as directed. Secured With Compression Wrap ThreePress (3 layer compression wrap) Discharge Instruction: Apply three layer compression as directed. Compression Stockings Add-Ons Electronic Signature(s) Signed: 04/20/2021  6:04:33 PM By: Carla Gouty RN, BSN Signed: 04/21/2021 8:22:14 AM By: Sandre Kitty Entered By: Sandre Kitty on 04/20/2021 07:55:37 -------------------------------------------------------------------------------- [place] Details Patient Name: Date of Service: MA [place], [name] NDA C. 04/20/2021 7:30 A M Medical Record Number: [id number] Patient Account Number: [id number] Date of Birth/Sex: Treating RN: December 07, 1950 (71 y.o. Carla Stokes Inessa Wardrop: Carla Stokes Other Clinician: Referring Benford Asch: Treating Aleyda Gindlesperger/Extender: Carla Stokes in Treatment: 1 Vital Signs Time Taken: 07:42 Temperature (F): 98.3 Height (in): 67 Pulse (bpm): 57 Source: Stated Respiratory Rate (breaths/min): 18 Source: Stated Blood Pressure (mmHg): 148/83 Capillary Blood Glucose (mg/dl): 136 Reference Range: 80 - 120 mg / dl Notes glucose per pt report yesterday morning Electronic Signature(s) Signed: 04/20/2021 6:04:33 PM By: Carla Gouty RN, BSN Entered By: Carla Stokes on 04/20/2021 07:44:19

## 2021-04-27 ENCOUNTER — Encounter (HOSPITAL_BASED_OUTPATIENT_CLINIC_OR_DEPARTMENT_OTHER): Payer: Medicare HMO | Admitting: General Surgery

## 2021-04-27 ENCOUNTER — Other Ambulatory Visit: Payer: Self-pay

## 2021-04-27 DIAGNOSIS — S80812A Abrasion, left lower leg, initial encounter: Secondary | ICD-10-CM | POA: Diagnosis not present

## 2021-04-27 NOTE — Progress Notes (Signed)
CHASITTY, [facility] ([id number]) Visit Report for 04/27/2021 Arrival Information Details Patient Name: Date of Service: MA [place], [name] NDA C. 04/27/2021 8:15 A M Medical Record Number: [id number] Patient Account Number: [id number] Date of Birth/Sex: Treating RN: [birth year] (71 y.o. [name] Primary Care Donte Lenzo: Teressa Lower Other Clinician: Referring Durwood Dittus: Treating Geneva Barrero/Extender: Jodelle Gross in Treatment: 2 Visit Information History Since Last Visit Added or deleted any medications: No Patient Arrived: Ambulatory Any new allergies or adverse reactions: No Arrival Time: 08:24 Had a fall or experienced change in No Accompanied By: self activities of daily living that may affect Transfer Assistance: None risk of falls: Patient Identification Verified: Yes Signs or symptoms of abuse/neglect since last visito No Secondary Verification Process Completed: Yes Hospitalized since last visit: No Patient Requires Transmission-Based Precautions: No Implantable device outside of the clinic excluding No Patient Has Alerts: Yes cellular tissue based products placed in the center Patient Alerts: Bil ABIs N/C, TBIs WNL since last visit: Has Dressing in Place as Prescribed: Yes Has Compression in Place as Prescribed: Yes Pain Present Now: No Electronic Signature(s) Signed: 04/27/2021 6:00:57 PM By: Baruch Gouty RN, BSN Entered By: Baruch Gouty on 04/27/2021 08:28:20 -------------------------------------------------------------------------------- Compression Therapy Details Patient Name: Date of Service: [name], [name] NDA C. 04/27/2021 8:15 A M Medical Record Number: [id number] Patient Account Number: [id number] Date of Birth/Sex: Treating RN: [birth year] (71 y.o. [name] Primary Care Nekeshia Lenhardt: Teressa Lower Other Clinician: Referring Frances Ambrosino: Treating Leeba Barbe/Extender: Jodelle Gross in Treatment: 2 Compression  Therapy Performed for Wound Assessment: Wound #1 Left,Lateral Lower Leg Performed By: Clinician Baruch Gouty, RN Compression Type: Three Layer Post Procedure Diagnosis Same as Pre-procedure Electronic Signature(s) Signed: 04/27/2021 6:00:57 PM By: Baruch Gouty RN, BSN Entered By: Baruch Gouty on 04/27/2021 08:47:13 -------------------------------------------------------------------------------- Encounter Discharge Information Details Patient Name: Date of Service: MA [place], [name] NDA C. 04/27/2021 8:15 A M Medical Record Number: [id number] Patient Account Number: [id number] Date of Birth/Sex: Treating RN: 05-30-1950 (71 y.o. [name] Primary Care Jylian Pappalardo: Teressa Lower Other Clinician: Referring Leoncio Hansen: Treating Alyas Creary/Extender: Jodelle Gross in Treatment: 2 Encounter Discharge Information Items Post Procedure Vitals Discharge Condition: Stable Temperature (F): 98.4 Ambulatory Status: Ambulatory Pulse (bpm): 53 Discharge Destination: Home Respiratory Rate (breaths/min): 18 Transportation: Private Auto Blood Pressure (mmHg): 161/91 Accompanied By: self Schedule Follow-up Appointment: Yes Clinical Summary of Care: Patient Declined Electronic Signature(s) Signed: 04/27/2021 6:00:57 PM By: Baruch Gouty RN, BSN Entered By: Baruch Gouty on 04/27/2021 09:04:18 -------------------------------------------------------------------------------- Lower Extremity Assessment Details Patient Name: Date of Service: MA [place], [name] NDA C. 04/27/2021 8:15 A M Medical Record Number: [id number] Patient Account Number: [id number] Date of Birth/Sex: Treating RN: 1950/04/19 (71 y.o. [name] Primary Care Ronae Noell: Teressa Lower Other Clinician: Referring Ceciley Buist: Treating Dalayza Zambrana/Extender: Jodelle Gross in Treatment: 2 Edema Assessment Assessed: [Left: No] [Right: No] Edema: [Left: Ye] [Right: s] Calf Left:  Right: Point of Measurement: From Medial Instep 35.5 cm Ankle Left: Right: Point of Measurement: From Medial Instep 22.5 cm Vascular Assessment Pulses: Dorsalis Pedis Palpable: [Left:Yes] Electronic Signature(s) Signed: 04/27/2021 6:00:57 PM By: Baruch Gouty RN, BSN Entered By: Baruch Gouty on 04/27/2021 08:32:20 -------------------------------------------------------------------------------- Multi Wound Chart Details Patient Name: Date of Service: [name], [name] NDA C. 04/27/2021 8:15 A M Medical Record Number: [id number] Patient Account Number: [id number] Date of Birth/Sex: Treating RN: [birth year] (71 y.o. [name] Primary Care Neala Miggins: Teressa Lower Other Clinician: Referring Jamariah Tony: Treating Cathalina Barcia/Extender: Jodelle Gross  in Treatment: 2 Vital Signs Height(in): 67 Capillary Blood Glucose(mg/dl): 176 Weight(lbs): Pulse(bpm): 53 Body Mass Index(BMI): Blood Pressure(mmHg): 161/91 Temperature(F): 98.4 Respiratory Rate(breaths/min): 18 Photos: [N/A:N/A] Left, Lateral Lower Leg N/A N/A Wound Location: Trauma N/A N/A Wounding Event: Diabetic Wound/Ulcer of the Lower N/A N/A Primary Etiology: Extremity Venous Leg Ulcer N/A N/A Secondary Etiology: Cataracts, Hypertension, Type II N/A N/A Comorbid History: Diabetes, Osteoarthritis, Neuropathy 01/27/2021 N/A N/A Date Acquired: 2 N/A N/A Weeks of Treatment: Open N/A N/A Wound Status: No N/A N/A Wound Recurrence: 0.7x0.8x0.1 N/A N/A Measurements L x W x D (cm) 0.44 N/A N/A A (cm) : rea 0.044 N/A N/A Volume (cm) : 85.10% N/A N/A % Reduction in A rea: 85.10% N/A N/A % Reduction in Volume: Grade 1 N/A N/A Classification: Small N/A N/A Exudate A mount: Serosanguineous N/A N/A Exudate Type: red, brown N/A N/A Exudate Color: Flat and Intact N/A N/A Wound Margin: Medium (34-66%) N/A N/A Granulation A mount: Red N/A N/A Granulation Quality: Medium (34-66%) N/A  N/A Necrotic A mount: Fat Layer (Subcutaneous Tissue): Yes N/A N/A Exposed Structures: Fascia: No Tendon: No Muscle: No Joint: No Bone: No Small (1-33%) N/A N/A Epithelialization: Debridement - Excisional N/A N/A Debridement: Pre-procedure Verification/Time Out 08:45 N/A N/A Taken: Lidocaine 4% Topical Solution N/A N/A Pain Control: Subcutaneous, Slough N/A N/A Tissue Debrided: Skin/Subcutaneous Tissue N/A N/A Level: 0.56 N/A N/A Debridement A (sq cm): rea Curette N/A N/A Instrument: Minimum N/A N/A Bleeding: Pressure N/A N/A Hemostasis A chieved: 3 N/A N/A Procedural Pain: 1 N/A N/A Post Procedural Pain: Procedure was tolerated well N/A N/A Debridement Treatment Response: 0.7x0.8x0.1 N/A N/A Post Debridement Measurements L x W x D (cm) 0.044 N/A N/A Post Debridement Volume: (cm) Compression Therapy N/A N/A Procedures Performed: Debridement Treatment Notes Electronic Signature(s) Signed: 04/27/2021 8:51:34 AM By: Fredirick Maudlin MD FACS Signed: 04/27/2021 6:00:57 PM By: Baruch Gouty RN, BSN Entered By: Fredirick Maudlin on 04/27/2021 08:51:34 -------------------------------------------------------------------------------- Multi-Disciplinary Care Plan Details Patient Name: Date of Service: MA [place], [name] NDA C. 04/27/2021 8:15 A M Medical Record Number: [id number] Patient Account Number: [id number] Date of Birth/Sex: Treating RN: [birth year] (71 y.o. [name] Primary Care Anielle Headrick: Teressa Lower Other Clinician: Referring Amaree Leeper: Treating Labrina Lines/Extender: Jodelle Gross in Treatment: 2 Multidisciplinary Care Plan reviewed with physician Active Inactive Abuse / Safety / Falls / Self Care Management Nursing Diagnoses: History of Falls Potential for falls Goals: Patient/caregiver will verbalize/demonstrate measures taken to prevent injury and/or falls Date Initiated: 04/07/2021 Target Resolution Date: 05/05/2021 Goal  Status: Active Interventions: Assess fall risk on admission and as needed Assess impairment of mobility on admission and as needed per policy Notes: Nutrition Nursing Diagnoses: Impaired glucose control: actual or potential Potential for alteratiion in Nutrition/Potential for imbalanced nutrition Goals: Patient/caregiver will maintain therapeutic glucose control Date Initiated: 04/07/2021 Target Resolution Date: 05/05/2021 Goal Status: Active Interventions: Assess HgA1c results as ordered upon admission and as needed Assess patient nutrition upon admission and as needed per policy Provide education on elevated blood sugars and impact on wound healing Treatment Activities: Patient referred to Primary Care Physician for further nutritional evaluation : 04/07/2021 Notes: Venous Leg Ulcer Nursing Diagnoses: Knowledge deficit related to disease process and management Potential for venous Insuffiency (use before diagnosis confirmed) Goals: Patient will maintain optimal edema control Date Initiated: 04/07/2021 Target Resolution Date: 05/05/2021 Goal Status: Active Interventions: Assess peripheral edema status every visit. Compression as ordered Provide education on venous insufficiency Treatment Activities: Therapeutic compression applied : 04/07/2021 Notes: Wound/Skin Impairment Nursing Diagnoses:  Impaired tissue integrity Knowledge deficit related to ulceration/compromised skin integrity Goals: Patient/caregiver will verbalize understanding of skin care regimen Date Initiated: 04/07/2021 Date Inactivated: 04/14/2021 Target Resolution Date: 05/05/2021 Goal Status: Met Ulcer/skin breakdown will have a volume reduction of 30% by week 4 Date Initiated: 04/07/2021 Target Resolution Date: 05/05/2021 Goal Status: Active Interventions: Assess patient/caregiver ability to obtain necessary supplies Assess patient/caregiver ability to perform ulcer/skin care regimen upon admission and as  needed Assess ulceration(s) every visit Provide education on ulcer and skin care Notes: Electronic Signature(s) Signed: 04/27/2021 6:00:57 PM By: Baruch Gouty RN, BSN Entered By: Baruch Gouty on 04/27/2021 08:39:29 -------------------------------------------------------------------------------- Pain Assessment Details Patient Name: Date of Service: [name], [name] NDA C. 04/27/2021 8:15 A M Medical Record Number: [id number] Patient Account Number: [id number] Date of Birth/Sex: Treating RN: 06-19-1950 (71 y.o. [name] Primary Care Delbra Zellars: Teressa Lower Other Clinician: Referring Shterna Laramee: Treating Alleene Stoy/Extender: Jodelle Gross in Treatment: 2 Active Problems Location of Pain Severity and Description of Pain Patient Has Paino No Site Locations Rate the pain. Rate the pain. Current Pain Level: 0 Character of Pain Describe the Pain: Tender Pain Management and Medication Current Pain Management: Electronic Signature(s) Signed: 04/27/2021 6:00:57 PM By: Baruch Gouty RN, BSN Entered By: Baruch Gouty on 04/27/2021 08:29:10 -------------------------------------------------------------------------------- Patient/Caregiver Education Details Patient Name: Date of Service: MA Merian Capron NDA C. 2/28/2023andnbsp8:15 [place] Record Number: [id number] Patient Account Number: [id number] Date of Birth/Gender: Treating RN: 13-Dec-1950 (71 y.o. [name] Primary Care Physician: Teressa Lower Other Clinician: Referring Physician: Treating Physician/Extender: Jodelle Gross in Treatment: 2 Education Assessment Education Provided To: Patient Education Topics Provided Elevated Blood Sugar/ Impact on Healing: Methods: Explain/Verbal Responses: Reinforcements needed, State content correctly Venous: Methods: Explain/Verbal Responses: Reinforcements needed, State content correctly Wound/Skin  Impairment: Methods: Explain/Verbal Responses: Reinforcements needed, State content correctly Electronic Signature(s) Signed: 04/27/2021 6:00:57 PM By: Baruch Gouty RN, BSN Entered By: Baruch Gouty on 04/27/2021 08:41:13 -------------------------------------------------------------------------------- Wound Assessment Details Patient Name: Date of Service: MA [place], [name] NDA C. 04/27/2021 8:15 A M Medical Record Number: [id number] Patient Account Number: [id number] Date of Birth/Sex: Treating RN: [birth year] (71 y.o. [name] Primary Care Bernadett Milian: Teressa Lower Other Clinician: Referring Mayank Teuscher: Treating Exa Bomba/Extender: Jodelle Gross in Treatment: 2 Wound Status Wound Number: 1 Primary Diabetic Wound/Ulcer of the Lower Extremity Etiology: Wound Location: Left, Lateral Lower Leg Secondary Venous Leg Ulcer Wounding Event: Trauma Etiology: Date Acquired: 01/27/2021 Wound Status: Open Weeks Of Treatment: 2 Comorbid Cataracts, Hypertension, Type II Diabetes, Osteoarthritis, Clustered Wound: No History: Neuropathy Photos Wound Measurements Length: (cm) 0.7 Width: (cm) 0.8 Depth: (cm) 0.1 Area: (cm) 0.44 Volume: (cm) 0.044 % Reduction in Area: 85.1% % Reduction in Volume: 85.1% Epithelialization: Small (1-33%) Tunneling: No Undermining: No Wound Description Classification: Grade 1 Wound Margin: Flat and Intact Exudate Amount: Small Exudate Type: Serosanguineous Exudate Color: red, brown Foul Odor After Cleansing: No Slough/Fibrino Yes Wound Bed Granulation Amount: Medium (34-66%) Exposed Structure Granulation Quality: Red Fascia Exposed: No Necrotic Amount: Medium (34-66%) Fat Layer (Subcutaneous Tissue) Exposed: Yes Necrotic Quality: Adherent Slough Tendon Exposed: No Muscle Exposed: No Joint Exposed: No Bone Exposed: No Treatment Notes Wound #1 (Lower Leg) Wound Laterality: Left, Lateral Cleanser Peri-Wound  Care Sween Lotion (Moisturizing lotion) Discharge Instruction: Apply moisturizing lotion as directed Topical Primary Dressing Promogran Prisma Matrix, 4.34 (sq in) (silver collagen) Discharge Instruction: Moisten collagen with saline or hydrogel Secondary Dressing Woven Gauze Sponge, Non-Sterile 4x4 in Discharge Instruction: Apply over primary  dressing as directed. Secured With Compression Wrap ThreePress (3 layer compression wrap) Discharge Instruction: Apply three layer compression as directed. Compression Stockings Add-Ons Electronic Signature(s) Signed: 04/27/2021 6:00:57 PM By: Baruch Gouty RN, BSN Entered By: Baruch Gouty on 04/27/2021 08:40:28 -------------------------------------------------------------------------------- Vitals Details Patient Name: Date of Service: [name], [name] NDA C. 04/27/2021 8:15 A M Medical Record Number: [id number] Patient Account Number: [id number] Date of Birth/Sex: Treating RN: 1950/08/22 (71 y.o. [name] Primary Care Nayely Dingus: Teressa Lower Other Clinician: Referring Nomar Broad: Treating Kasia Trego/Extender: Jodelle Gross in Treatment: 2 Vital Signs Time Taken: 08:28 Temperature (F): 98.4 Height (in): 67 Pulse (bpm): 53 Source: Stated Respiratory Rate (breaths/min): 18 Blood Pressure (mmHg): 161/91 Capillary Blood Glucose (mg/dl): 176 Reference Range: 80 - 120 mg / dl Notes glucose per pt report this am Electronic Signature(s) Signed: 04/27/2021 6:00:57 PM By: Baruch Gouty RN, BSN Entered By: Baruch Gouty on 04/27/2021 08:29:01

## 2021-04-27 NOTE — Progress Notes (Signed)
Carla Stokes, Carla C. ([id number]) Visit Report for 04/27/2021 Chief Complaint Document Details Patient Name: [place] 04/27/2021 8:15 A M Medical Record Number: [id number] Patient Account Number: [id number] Date of Birth/Sex: Treating RN: 04/10/1950 (71 y.o. Carla Stokes) Stokes, Carla Primary Care Provider: Dina Stokes, Carla Stokes Other Clinician: Referring Provider: Treating Provider/Extender: Pincus Badderannon, Maurico Perrell Dough, Carla Stokes Weeks in Treatment: 2 Information Obtained from: Patient Chief Complaint 04/27/2021; patient is here for review of wound on the left lateral lower leg Electronic Signature(s) Signed: 04/27/2021 8:53:53 AM By: Carla Stokes, Carla Frommelt MD Stokes Entered By: Carla Stokes, Maudean Hoffmann on 04/27/2021 08:53:53 -------------------------------------------------------------------------------- Debridement Details Patient Name: Date of Service: [place] N[place]. 04/27/2021 8:15 A M Medical Record Number: [id number] Patient Account Number: [id number] Date of Birth/Sex: Treating RN: 08/21/1950 2(70 y.o. Carla Stokes) Stokes, Carla Primary Care Provider: Dina Stokes, Carla Stokes Other Clinician: Referring Provider: Treating Provider/Extender: Pincus Badderannon, Haani Bakula Dough, Carla Stokes Weeks in Treatment: 2 Debridement Performed for Assessment: Wound #1 Left,Lateral Lower Leg Performed By: Physician Carla Stokes, Arlys Scatena, MD Debridement Type: Debridement Severity of Tissue Pre Debridement: Fat layer exposed Level of Consciousness (Pre-procedure): Awake and Alert Pre-procedure Verification/Time Out Yes - 08:45 Taken: Start Time: 08:46 Pain Control: Lidocaine 4% T opical Solution T Area Debrided (L x W): otal 0.7 (cm) x 0.8 (cm) = 0.56 (cm) Tissue and other material debrided: Viable, Non-Viable, Slough, Subcutaneous, Slough Level: Skin/Subcutaneous Tissue Debridement Description: Excisional Instrument: Curette Bleeding: Minimum Hemostasis Achieved: Pressure Procedural Pain: 3 Post Procedural Pain: 1 Response to  Treatment: Procedure was tolerated well Level of Consciousness (Post- Awake and Alert procedure): Post Debridement Measurements of Total Wound Length: (cm) 0.7 Width: (cm) 0.8 Depth: (cm) 0.1 Volume: (cm) 0.044 Character of Wound/Ulcer Post Debridement: Improved Severity of Tissue Post Debridement: Fat layer exposed Post Procedure Diagnosis Same as Pre-procedure Electronic Signature(s) Signed: 04/27/2021 8:59:33 AM By: Carla Stokes, Graycen Sadlon MD Stokes Signed: 04/27/2021 6:00:57 PM By: Zenaida DeedBoehlein, Linda RN, BSN Entered By: Zenaida DeedBoehlein, Carla on 04/27/2021 08:48:43 -------------------------------------------------------------------------------- HPI Details Patient Name: Date of Service: [place] 04/27/2021 8:15 A M Medical Record Number: [id number] Patient Account Number: [id number] Date of Birth/Sex: Treating RN: 09/15/1950 [age] y.o. Carla Stokes) Stokes, Carla Primary Care Provider: Dina Stokes, Carla Stokes Other Clinician: Referring Provider: Treating Provider/Extender: Pincus Badderannon, Kvion Shapley Dough, Carla Stokes Weeks in Treatment: 2 History of Present Illness HPI Description: ADMISSION 04/07/2021 This is a 71 year old woman with type 2 diabetes. She states her problem began towards the end of November when she had a fall winter in the yard and injured her left lower leg. She has [facility]. Saw them on 03/02/2021 she was felt to have cellulitis of the left ankle and the left leg wound. She ultimately was felt to have a venous stasis ulcer and I believe was put in an [employer] boot. She is not wearing that currently. She had several further visits. The wound became increasingly painful and on 04/01/2021 she went to the emergency room. An x-ray of the area showed vascular calcification but no bony abnormality. She was put on doxycycline which she is still taking. She does not have a history of prior wounds or venous insufficiency that she knows about. Past medical history includes type 2  diabetes with a A1c of 7.5 in October, hypertension, gout, hypercholesterolemia, diabetic foot ulcer and a history of PAD The patient follows with vascular surgery in [place]. He was able to see that she has previously had vascular evaluation most recently in March 2022. Although I do not have the  exact numbers it is quoted in their last note it is showing noncompressible ABIs but normal TBI's and normal waveforms. As such there should not be any difficulty with sufficient blood flow to heal this wound. 2/15; 2 small areas which were initially traumatic but then complicated by infection. We have been using Iodoflex. Both wounds had a very necrotic surface last week. 04/20/2021: 2 small areas that were initially traumatic and then complicated by infection. Iodoflex has been used with clear improvement. The wounds continue to contract and are smaller again today without any necrosis, but with adherent slough. 04/27/2021: The smaller, more distal area is almost closed. The Iodoflex is drying out too much and is pulling away tissue when it is removed. There is a small amount of slough present in the larger wound. There is minimal drainage. Electronic Signature(s) Signed: 04/27/2021 8:55:07 AM By: Carla Stokes Entered By: Carla Stokes on 04/27/2021 08:55:07 -------------------------------------------------------------------------------- Physical Exam Details Patient Name: Date of Service: MA Carla Stokes, [name]. 04/27/2021 8:15 A M Medical Record Number: [id number] Patient Account Number: [id number] Date of Birth/Sex: Treating RN: [birth year] (71 y.o. Carla Standard Primary Care Provider: Dina Rich Other Clinician: Referring Provider: Treating Provider/Extender: Pincus Badder in Treatment: 2 Constitutional She is hypertensive. . . No acute distress. Respiratory Normal work of breathing on room air. Notes 04/27/2021: Wound evaluationboth lesions continue  to contract. The more caudal, smaller lesion is almost closed, but not quite ready to heal out yet. The larger lesion has a small amount of slough, but no significant drainage. I debrided the slough with a #3 curette. Electronic Signature(s) Signed: 04/27/2021 8:57:51 AM By: Carla Stokes Entered By: Carla Stokes on 04/27/2021 08:57:51 -------------------------------------------------------------------------------- Physician Orders Details Patient Name: Date of Service: [place], [name]. 04/27/2021 8:15 A M Medical Record Number: [id number] Patient Account Number: [id number] Date of Birth/Sex: Treating RN: February 15, 1951 (71 y.o. [place], Carla Primary Care Provider: Dina Rich Other Clinician: Referring Provider: Treating Provider/Extender: Pincus Badder in Treatment: 2 Verbal / Phone Orders: No Diagnosis Coding ICD-10 Coding Code Description [phone] Abrasion, left lower leg, subsequent encounter L97.828 Non-pressure chronic ulcer of other part of left lower leg with other specified severity I87.332 Chronic venous hypertension (idiopathic) with ulcer and inflammation of left lower extremity Follow-up Appointments ppointment in 1 week. - Dr. Lady Gary room 1 Return A Bathing/ Shower/ Hygiene May shower with protection but do not get wound dressing(s) wet. Edema Control - Lymphedema / SCD / Other Left Lower Extremity Elevate legs to the level of the heart or above for 30 minutes daily and/or when sitting, a frequency of: - 3-4 times a day throughout the day. Avoid standing for long periods of time. Exercise regularly Moisturize legs daily. - right leg every night before bed. Compression stocking or Garment 20-30 mm/Hg pressure to: - right leg daily Wound Treatment Wound #1 - Lower Leg Wound Laterality: Left, Lateral Peri-Wound Care: Sween Lotion (Moisturizing lotion) 1 x Per Week Discharge Instructions: Apply moisturizing lotion as  directed Prim Dressing: Promogran Prisma Matrix, 4.34 (sq in) (silver collagen) 1 x Per Week ary Discharge Instructions: Moisten collagen with saline or hydrogel Secondary Dressing: Woven Gauze Sponge, Non-Sterile 4x4 in 1 x Per Week Discharge Instructions: Apply over primary dressing as directed. Compression Wrap: ThreePress (3 layer compression wrap) 1 x Per Week Discharge Instructions: Apply three layer compression as directed. Patient Medications llergies: codeine, Farxiga A Notifications Medication Indication Start End prior to debridement  04/27/2021 lidocaine DOSE topical 4 % cream - cream topical Electronic Signature(s) Signed: 04/27/2021 8:58:18 AM By: Carla Stokes Entered By: Carla Stokes on 04/27/2021 08:58:18 -------------------------------------------------------------------------------- Problem List Details Patient Name: Date of Service: MA [place], [name]. 04/27/2021 8:15 A M Medical Record Number: [id number] Patient Account Number: [id number] Date of Birth/Sex: Treating RN: [birth year] (71 y.o. Carla Standard Primary Care Provider: Dina Rich Other Clinician: Referring Provider: Treating Provider/Extender: Pincus Badder in Treatment: 2 Active Problems ICD-10 Encounter Code Description Active Date MDM Diagnosis S80.812D Abrasion, left lower leg, subsequent encounter 04/07/2021 No Yes L97.828 Non-pressure chronic ulcer of other part of left lower leg with other specified 04/07/2021 No Yes severity I87.332 Chronic venous hypertension (idiopathic) with ulcer and inflammation of left 04/07/2021 No Yes lower extremity Inactive Problems Resolved Problems Electronic Signature(s) Signed: 04/27/2021 8:51:15 AM By: Carla Stokes Entered By: Carla Stokes on 04/27/2021 08:51:15 -------------------------------------------------------------------------------- Progress Note Details Patient Name: Date of Service: MA  Carla Stokes, Carla Stokes NDA C. 04/27/2021 8:15 A M Medical Record Number: [id number] Patient Account Number: [id number] Date of Birth/Sex: Treating RN: [birth year] (71 y.o. Carla Standard Primary Care Provider: Dina Rich Other Clinician: Referring Provider: Treating Provider/Extender: Pincus Badder in Treatment: 2 Subjective Chief Complaint Information obtained from Patient 04/27/2021; patient is here for review of wound on the left lateral lower leg History of Present Illness (HPI) ADMISSION 04/07/2021 This is a 71 year old woman with type 2 diabetes. She states her problem began towards the end of November when she had a fall winter in the yard and injured her left lower leg. She has [facility]. Saw them on 03/02/2021 she was felt to have cellulitis of the left ankle and the left leg wound. She ultimately was felt to have a venous stasis ulcer and I believe was put in an [employer]. She is not wearing that currently. She had several further visits. The wound became increasingly painful and on 04/01/2021 she went to the emergency room. An x-ray of the area showed vascular calcification but no bony abnormality. She was put on doxycycline which she is still taking. She does not have a history of prior wounds or venous insufficiency that she knows about. Past medical history includes type 2 diabetes with a A1c of 7.5 in October, hypertension, gout, hypercholesterolemia, diabetic foot ulcer and a history of PAD The patient follows with vascular surgery in [place]. He was able to see that she has previously had vascular evaluation most recently in March 2022. Although I do not have the exact numbers it is quoted in their last note it is showing noncompressible ABIs but normal TBI's and normal waveforms. As such there should not be any difficulty with sufficient blood flow to heal this wound. 2/15; 2 small areas which were initially traumatic but  then complicated by infection. We have been using Iodoflex. Both wounds had a very necrotic surface last week. 04/20/2021: 2 small areas that were initially traumatic and then complicated by infection. Iodoflex has been used with clear improvement. The wounds continue to contract and are smaller again today without any necrosis, but with adherent slough. 04/27/2021: The smaller, more distal area is almost closed. The Iodoflex is drying out too much and is pulling away tissue when it is removed. There is a small amount of slough present in the larger wound. There is minimal drainage. Patient History Information obtained from Patient, Chart. Family History Cancer -  Siblings,Father, Diabetes - Siblings, Heart Disease - Siblings,Mother, Hypertension - Siblings,Mother, Lung Disease - Siblings, Stroke - Siblings,Mother, Thyroid Problems - Siblings, No family history of Hereditary Spherocytosis, Kidney Disease, Seizures, Tuberculosis. Social History Never smoker, Marital Status - Divorced, Alcohol Use - Rarely, Drug Use - No History, Caffeine Use - Moderate - coffee. Medical History Eyes Patient has history of Cataracts - left eye removed Denies history of Glaucoma Cardiovascular Patient has history of Hypertension Endocrine Patient has history of Type II Diabetes Denies history of Type I Diabetes Integumentary (Skin) Denies history of History of Burn Musculoskeletal Patient has history of Osteoarthritis Denies history of Gout, Rheumatoid Arthritis, Osteomyelitis Neurologic Patient has history of Neuropathy Oncologic Denies history of Received Chemotherapy, Received Radiation Psychiatric Denies history of Anorexia/bulimia, Confinement Anxiety Hospitalization/Surgery History - left cataract extraction. - c-section. - total abdominal hysterectomy w/ bil salpingoophrectomy. - wisdom tooth extraction. Medical A Surgical History Notes nd Constitutional Symptoms (General  Health) obesity Cardiovascular hyperlipidemia Objective Constitutional She is hypertensive. No acute distress. Vitals Time Taken: 8:28 AM, Height: 67 in, Source: Stated, Temperature: 98.4 F, Pulse: 53 bpm, Respiratory Rate: 18 breaths/min, Blood Pressure: 161/91 mmHg, Capillary Blood Glucose: 176 mg/dl. General Notes: glucose per pt report this am Respiratory Normal work of breathing on room air. General Notes: 04/27/2021: Wound evaluation[place] lesions continue to contract. The more caudal, smaller lesion is almost closed, but not quite ready to heal out yet. The larger lesion has a small amount of slough, but no significant drainage. I debrided the slough with a #3 curette. Integumentary (Hair, Skin) Wound #1 status is Open. Original cause of wound was Trauma. The date acquired was: 01/27/2021. The wound has been in treatment 2 weeks. The wound is located on the Left,Lateral Lower Leg. The wound measures 0.7cm length x 0.8cm width x 0.1cm depth; 0.44cm^2 area and 0.044cm^3 volume. There is Fat Layer (Subcutaneous Tissue) exposed. There is no tunneling or undermining noted. There is a small amount of serosanguineous drainage noted. The wound margin is flat and intact. There is medium (34-66%) red granulation within the wound bed. There is a medium (34-66%) amount of necrotic tissue within the wound bed including Adherent Slough. Assessment Active Problems ICD-10 Abrasion, left lower leg, subsequent encounter Non-pressure chronic ulcer of other part of left lower leg with other specified severity Chronic venous hypertension (idiopathic) with ulcer and inflammation of left lower extremity Procedures Wound #1 Pre-procedure diagnosis of Wound #1 is a Diabetic Wound/Ulcer of the Lower Extremity located on the Left,Lateral Lower Leg .Severity of Tissue Pre Debridement is: Fat layer exposed. There was a Excisional Skin/Subcutaneous Tissue Debridement with a total area of 0.56 sq cm performed  by Carla Guess, MD. With the following instrument(s): Curette to remove Viable and Non-Viable tissue/material. Material removed includes Subcutaneous Tissue and Slough and after achieving pain control using Lidocaine 4% T opical Solution. No specimens were taken. A time out was conducted at 08:45, prior to the start of the procedure. A Minimum amount of bleeding was controlled with Pressure. The procedure was tolerated well with a pain level of 3 throughout and a pain level of 1 following the procedure. Post Debridement Measurements: 0.7cm length x 0.8cm width x 0.1cm depth; 0.044cm^3 volume. Character of Wound/Ulcer Post Debridement is improved. Severity of Tissue Post Debridement is: Fat layer exposed. Post procedure Diagnosis Wound #1: Same as Pre-Procedure Pre-procedure diagnosis of Wound #1 is a Diabetic Wound/Ulcer of the Lower Extremity located on the Left,Lateral Lower Leg . There was a Three Layer  Compression Therapy Procedure by Zenaida DeedBoehlein, Linda, RN. Post procedure Diagnosis Wound #1: Same as Pre-Procedure Plan Follow-up Appointments: Return Appointment in 1 week. - Dr. Lady Garyannon room 1 Bathing/ Shower/ Hygiene: May shower with protection but do not get wound dressing(s) wet. Edema Control - Lymphedema / SCD / Other: Elevate legs to the level of the heart or above for 30 minutes daily and/or when sitting, a frequency of: - 3-4 times a day throughout the day. Avoid standing for long periods of time. Exercise regularly Moisturize legs daily. - right leg every night before bed. Compression stocking or Garment 20-30 mm/Hg pressure to: - right leg daily The following medication(s) was prescribed: lidocaine topical 4 % cream cream topical for prior to debridement was prescribed at facility WOUND #1: - Lower Leg Wound Laterality: Left, Lateral Peri-Wound Care: Sween Lotion (Moisturizing lotion) 1 x Per Week/ Discharge Instructions: Apply moisturizing lotion as directed Prim Dressing:  Promogran Prisma Matrix, 4.34 (sq in) (silver collagen) 1 x Per Week/ ary Discharge Instructions: Moisten collagen with saline or hydrogel Secondary Dressing: Woven Gauze Sponge, Non-Sterile 4x4 in 1 x Per Week/ Discharge Instructions: Apply over primary dressing as directed. Com pression Wrap: ThreePress (3 layer compression wrap) 1 x Per Week/ Discharge Instructions: Apply three layer compression as directed. 04/27/2021: Due to the decrease in drainage and the dehydration of the Iodoflex, we will change her dressing to collagen. Continue compression. I will see her in 1 week. Electronic Signature(s) Signed: 04/27/2021 8:59:02 AM By: Carla Stokes, Omah Dewalt MD Stokes Entered By: Carla Stokes, Ulmer Degen on 04/27/2021 08:59:02 -------------------------------------------------------------------------------- HxROS Details Patient Name: Date of Service: [place] N[place] C. 04/27/2021 8:15 A M Medical Record Number: [id number] Patient Account Number: [id number] Date of Birth/Sex: Treating RN: 11/22/1950 [age] y.o. Carla Stokes) Stokes, Carla Primary Care Provider: Dina Stokes, Carla Stokes Other Clinician: Referring Provider: Treating Provider/Extender: Pincus Badderannon, Clevester Helzer Dough, Carla Stokes Weeks in Treatment: 2 Information Obtained From Patient Chart Constitutional Symptoms (General Health) Medical History: Past Medical History Notes: obesity Eyes Medical History: Positive for: Cataracts - left eye removed Negative for: Glaucoma Cardiovascular Medical History: Positive for: Hypertension Past Medical History Notes: hyperlipidemia Endocrine Medical History: Positive for: Type II Diabetes Negative for: Type I Diabetes Time with diabetes: >15 years Treated with: Insulin, Oral agents Blood sugar tested every day: Yes Tested : Integumentary (Skin) Medical History: Negative for: History of Burn Musculoskeletal Medical History: Positive for: Osteoarthritis Negative for: Gout; Rheumatoid Arthritis;  Osteomyelitis Neurologic Medical History: Positive for: Neuropathy Oncologic Medical History: Negative for: Received Chemotherapy; Received Radiation Psychiatric Medical History: Negative for: Anorexia/bulimia; Confinement Anxiety HBO Extended History Items Eyes: Cataracts Immunizations Pneumococcal Vaccine: Received Pneumococcal Vaccination: Yes Received Pneumococcal Vaccination On or After 60th Birthday: Yes Implantable Devices None Hospitalization / Surgery History Type of Hospitalization/Surgery left cataract extraction c-section total abdominal hysterectomy w/ bil salpingoophrectomy wisdom tooth extraction Family and Social History Cancer: Yes - Siblings,Father; Diabetes: Yes - Siblings; Heart Disease: Yes - Siblings,Mother; Hereditary Spherocytosis: No; Hypertension: Yes - Siblings,Mother; Kidney Disease: No; Lung Disease: Yes - Siblings; Seizures: No; Stroke: Yes - Siblings,Mother; Thyroid Problems: Yes - Siblings; Tuberculosis: No; Never smoker; Marital Status - Divorced; Alcohol Use: Rarely; Drug Use: No History; Caffeine Use: Moderate - coffee; Financial Concerns: No; Food, Clothing or Shelter Needs: No; Support System Lacking: No; Transportation Concerns: No Physician Affirmation I have reviewed and agree with the above information. Electronic Signature(s) Signed: 04/27/2021 8:59:33 AM By: Carla Stokes, Braelyn Bordonaro MD Stokes Signed: 04/27/2021 6:00:57 PM By: Zenaida DeedBoehlein, Linda RN, BSN Entered By: Carla Stokes, Jeannine Pennisi on 04/27/2021 08:55:20 --------------------------------------------------------------------------------  SuperBill Details Patient Name: Date of Service: [place], [name]. 04/27/2021 Medical Record Number: [id number] Patient Account Number: [id number] Date of Birth/Sex: Treating RN: [birth year] (71 y.o. Carla Standard Primary Care Provider: Dina Rich Other Clinician: Referring Provider: Treating Provider/Extender: Pincus Badder in  Treatment: 2 Diagnosis Coding ICD-10 Codes Code Description [phone] Abrasion, left lower leg, subsequent encounter L97.828 Non-pressure chronic ulcer of other part of left lower leg with other specified severity I87.332 Chronic venous hypertension (idiopathic) with ulcer and inflammation of left lower extremity Facility Procedures CPT4 Code: [id number] Description: 11042 - DEB SUBQ TISSUE 20 SQ CM/< ICD-10 Diagnosis Description L97.828 Non-pressure chronic ulcer of other part of left lower leg with other specified Modifier: severity Quantity: 1 Physician Procedures : CPT4 Code Description Modifier [id number] 11042 - WC PHYS SUBQ TISS 20 SQ CM ICD-10 Diagnosis Description L97.828 Non-pressure chronic ulcer of other part of left lower leg with other specified severity Quantity: 1 Electronic Signature(s) Signed: 04/27/2021 8:59:18 AM By: Carla Stokes Entered By: Carla Stokes on 04/27/2021 08:59:17

## 2021-05-05 ENCOUNTER — Encounter (HOSPITAL_BASED_OUTPATIENT_CLINIC_OR_DEPARTMENT_OTHER): Payer: Medicare HMO | Attending: General Surgery | Admitting: General Surgery

## 2021-05-05 ENCOUNTER — Other Ambulatory Visit: Payer: Self-pay

## 2021-05-05 DIAGNOSIS — Y92096 Garden or yard of other non-institutional residence as the place of occurrence of the external cause: Secondary | ICD-10-CM | POA: Diagnosis not present

## 2021-05-05 DIAGNOSIS — E11621 Type 2 diabetes mellitus with foot ulcer: Secondary | ICD-10-CM | POA: Insufficient documentation

## 2021-05-05 DIAGNOSIS — I10 Essential (primary) hypertension: Secondary | ICD-10-CM | POA: Insufficient documentation

## 2021-05-05 DIAGNOSIS — I87332 Chronic venous hypertension (idiopathic) with ulcer and inflammation of left lower extremity: Secondary | ICD-10-CM | POA: Diagnosis not present

## 2021-05-05 DIAGNOSIS — E1151 Type 2 diabetes mellitus with diabetic peripheral angiopathy without gangrene: Secondary | ICD-10-CM | POA: Insufficient documentation

## 2021-05-05 DIAGNOSIS — L97828 Non-pressure chronic ulcer of other part of left lower leg with other specified severity: Secondary | ICD-10-CM | POA: Insufficient documentation

## 2021-05-05 DIAGNOSIS — S80812A Abrasion, left lower leg, initial encounter: Secondary | ICD-10-CM | POA: Insufficient documentation

## 2021-05-05 DIAGNOSIS — E7800 Pure hypercholesterolemia, unspecified: Secondary | ICD-10-CM | POA: Insufficient documentation

## 2021-05-05 DIAGNOSIS — W19XXXA Unspecified fall, initial encounter: Secondary | ICD-10-CM | POA: Insufficient documentation

## 2021-05-05 DIAGNOSIS — Z792 Long term (current) use of antibiotics: Secondary | ICD-10-CM | POA: Insufficient documentation

## 2021-05-05 DIAGNOSIS — M109 Gout, unspecified: Secondary | ICD-10-CM | POA: Insufficient documentation

## 2021-05-05 NOTE — Progress Notes (Signed)
Carla Stokes, Carla Stokes ([id number]) Visit Report for 05/05/2021 Arrival Information Details Patient Name: Date of Service: MA [place], [name]. 05/05/2021 8:30 A M Medical Record Number: [id number] Patient Account Number: [id number] Date of Birth/Sex: Treating RN: 1950-08-02 (71 y.o. F) Primary Care Marda Breidenbach: Dina Rich Other Clinician: Referring Brantlee Penn: Treating Bruk Tumolo/Extender: Pincus Badder in Treatment: 4 Visit Information History Since Last Visit Added or deleted any medications: No Patient Arrived: Ambulatory Any new allergies or adverse reactions: No Arrival Time: 08:43 Had a fall or experienced change in No Accompanied By: self activities of daily living that may affect Transfer Assistance: None risk of falls: Patient Identification Verified: Yes Signs or symptoms of abuse/neglect since last visito No Secondary Verification Process Completed: Yes Hospitalized since last visit: No Patient Requires Transmission-Based Precautions: No Implantable device outside of the clinic excluding No Patient Has Alerts: Yes cellular tissue based products placed in the center Patient Alerts: Bil ABIs N/C, TBIs WNL since last visit: Has Dressing in Place as Prescribed: Yes Pain Present Now: No Electronic Signature(s) Signed: 05/05/2021 9:49:12 AM By: Karl Ito Entered By: Karl Ito on 05/05/2021 08:43:59 -------------------------------------------------------------------------------- Compression Therapy Details Patient Name: Date of Service: MA Carla Stokes, Carla Stokes NDA C. 05/05/2021 8:30 A M Medical Record Number: [id number] Patient Account Number: [id number] Date of Birth/Sex: Treating RN: [birth year] (71 y.o. Wynelle Link Primary Care Bethany Hirt: Dina Rich Other Clinician: Referring Sarika Baldini: Treating Keval Nam/Extender: Pincus Badder in Treatment: 4 Compression Therapy Performed for Wound Assessment: Wound #1 Left,Lateral Lower  Leg Performed By: Clinician Zandra Abts, RN Compression Type: Three Layer Post Procedure Diagnosis Same as Pre-procedure Electronic Signature(s) Signed: 05/05/2021 6:27:47 PM By: Zandra Abts RN, BSN Entered By: Zandra Abts on 05/05/2021 09:04:05 -------------------------------------------------------------------------------- Encounter Discharge Information Details Patient Name: Date of Service: MA Carla Stokes, [name]. 05/05/2021 8:30 A M Medical Record Number: [id number] Patient Account Number: [id number] Date of Birth/Sex: Treating RN: 14-Mar-1950 (71 y.o. Wynelle Link Primary Care Jaleal Schliep: Dina Rich Other Clinician: Referring Kenyanna Grzesiak: Treating Sevyn Markham/Extender: Pincus Badder in Treatment: 4 Encounter Discharge Information Items Post Procedure Vitals Discharge Condition: Stable Temperature (F): 98.1 Ambulatory Status: Ambulatory Pulse (bpm): 56 Discharge Destination: Home Respiratory Rate (breaths/min): 18 Transportation: Private Auto Blood Pressure (mmHg): 144/76 Accompanied By: alone Schedule Follow-up Appointment: Yes Clinical Summary of Care: Patient Declined Electronic Signature(s) Signed: 05/05/2021 6:27:47 PM By: Zandra Abts RN, BSN Entered By: Zandra Abts on 05/05/2021 12:59:11 -------------------------------------------------------------------------------- Lower Extremity Assessment Details Patient Name: Date of Service: MA [place], [name]. 05/05/2021 8:30 A M Medical Record Number: [id number] Patient Account Number: [id number] Date of Birth/Sex: Treating RN: 07-03-1950 (71 y.o. Wynelle Link Primary Care Sherod Cisse: Dina Rich Other Clinician: Referring Loris Seelye: Treating Jules Baty/Extender: Pincus Badder in Treatment: 4 Edema Assessment Assessed: Kyra Searles: No] Franne Forts: No] Edema: [Left: Ye] [Right: s] Calf Left: Right: Point of Measurement: From Medial Instep 35.5 cm Ankle Left:  Right: Point of Measurement: From Medial Instep 22.5 cm Vascular Assessment Pulses: Dorsalis Pedis Palpable: [Left:Yes] Electronic Signature(s) Signed: 05/05/2021 6:27:47 PM By: Zandra Abts RN, BSN Entered By: Zandra Abts on 05/05/2021 09:00:46 -------------------------------------------------------------------------------- Multi Wound Chart Details Patient Name: Date of Service: MA Carla Stokes, Carla Stokes NDA C. 05/05/2021 8:30 A M Medical Record Number: [id number] Patient Account Number: [id number] Date of Birth/Sex: Treating RN: [birth year] (71 y.o. F) Primary Care Mava Suares: Dina Rich Other Clinician: Referring Lowen Barringer: Treating Marcanthony Sleight/Extender: Pincus Badder in Treatment: 4 Vital Signs Height(in): 67 Capillary Blood Glucose(mg/dl): 734 Weight(lbs): Pulse(bpm):  56 Body Mass Index(BMI): Blood Pressure(mmHg): 144/76 Temperature(F): 98.1 Respiratory Rate(breaths/min): 18 Photos: [N/A:N/A] Left, Lateral Lower Leg N/A N/A Wound Location: Trauma N/A N/A Wounding Event: Diabetic Wound/Ulcer of the Lower N/A N/A Primary Etiology: Extremity Venous Leg Ulcer N/A N/A Secondary Etiology: Cataracts, Hypertension, Type II N/A N/A Comorbid History: Diabetes, Osteoarthritis, Neuropathy 01/27/2021 N/A N/A Date Acquired: 4 N/A N/A Weeks of Treatment: Open N/A N/A Wound Status: No N/A N/A Wound Recurrence: 1x0.3x0.1 N/A N/A Measurements L x W x D (cm) 0.236 N/A N/A A (cm) : rea 0.024 N/A N/A Volume (cm) : 92.00% N/A N/A % Reduction in A rea: 91.90% N/A N/A % Reduction in Volume: Grade 1 N/A N/A Classification: Small N/A N/A Exudate A mount: Serosanguineous N/A N/A Exudate Type: red, brown N/A N/A Exudate Color: Flat and Intact N/A N/A Wound Margin: Large (67-100%) N/A N/A Granulation A mount: Red, Pink N/A N/A Granulation Quality: Small (1-33%) N/A N/A Necrotic A mount: Fat Layer (Subcutaneous Tissue): Yes N/A N/A Exposed  Structures: Fascia: No Tendon: No Muscle: No Joint: No Bone: No Medium (34-66%) N/A N/A Epithelialization: Debridement - Selective/Open Wound N/A N/A Debridement: Pre-procedure Verification/Time Out 09:02 N/A N/A Taken: Slough N/A N/A Tissue Debrided: Non-Viable Tissue N/A N/A Level: 0.09 N/A N/A Debridement A (sq cm): rea Curette N/A N/A Instrument: Minimum N/A N/A Bleeding: Pressure N/A N/A Hemostasis A chieved: 0 N/A N/A Procedural Pain: 0 N/A N/A Post Procedural Pain: Procedure was tolerated well N/A N/A Debridement Treatment Response: 1x0.3x0.1 N/A N/A Post Debridement Measurements L x W x D (cm) 0.024 N/A N/A Post Debridement Volume: (cm) Compression Therapy N/A N/A Procedures Performed: Debridement Treatment Notes Electronic Signature(s) Signed: 05/05/2021 9:15:41 AM By: Duanne Guess MD FACS Entered By: Duanne Guess on 05/05/2021 09:15:41 -------------------------------------------------------------------------------- Multi-Disciplinary Care Plan Details Patient Name: Date of Service: MA [place], [name]. 05/05/2021 8:30 A M Medical Record Number: [id number] Patient Account Number: [id number] Date of Birth/Sex: Treating RN: 1950/11/19 (71 y.o. Wynelle Link Primary Care Alaysha Jefcoat: Dina Rich Other Clinician: Referring Rutha Melgoza: Treating Yuna Pizzolato/Extender: Pincus Badder in Treatment: 4 Multidisciplinary Care Plan reviewed with physician Active Inactive Nutrition Nursing Diagnoses: Impaired glucose control: actual or potential Potential for alteratiion in Nutrition/Potential for imbalanced nutrition Goals: Patient/caregiver will maintain therapeutic glucose control Date Initiated: 04/07/2021 Target Resolution Date: 06/04/2021 Goal Status: Active Interventions: Assess HgA1c results as ordered upon admission and as needed Assess patient nutrition upon admission and as needed per policy Provide education on elevated  blood sugars and impact on wound healing Treatment Activities: Patient referred to Primary Care Physician for further nutritional evaluation : 04/07/2021 Notes: Venous Leg Ulcer Nursing Diagnoses: Knowledge deficit related to disease process and management Potential for venous Insuffiency (use before diagnosis confirmed) Goals: Patient will maintain optimal edema control Date Initiated: 04/07/2021 Target Resolution Date: 06/04/2021 Goal Status: Active Interventions: Assess peripheral edema status every visit. Compression as ordered Provide education on venous insufficiency Treatment Activities: Therapeutic compression applied : 04/07/2021 Notes: Electronic Signature(s) Signed: 05/05/2021 6:27:47 PM By: Zandra Abts RN, BSN Entered By: Zandra Abts on 05/05/2021 12:56:54 -------------------------------------------------------------------------------- Pain Assessment Details Patient Name: Date of Service: MA Carla Stokes, Carla Stokes NDA C. 05/05/2021 8:30 A M Medical Record Number: [id number] Patient Account Number: [id number] Date of Birth/Sex: Treating RN: [birth year] (71 y.o. F) Primary Care Kase Shughart: Dina Rich Other Clinician: Referring Anastasios Melander: Treating Victor Granados/Extender: Pincus Badder in Treatment: 4 Active Problems Location of Pain Severity and Description of Pain Patient Has Paino No Site Locations Pain Management and Medication  Current Pain Management: Electronic Signature(s) Signed: 05/05/2021 9:49:12 AM By: Karl Itoawkins, Destiny Entered By: Karl Itoawkins, Destiny on 05/05/2021 08:44:29 -------------------------------------------------------------------------------- Patient/Caregiver Education Details Patient Name: Date of Service: MA Carla Stokes, Carla Stokes NDA C. 3/8/2023andnbsp8:30 A M Medical Record Number: [id number] Patient Account Number: [id number] Date of Birth/Gender: Treating RN: 07/03/1950 8(70 y.o. Wynelle LinkF) Lynch, [name] Primary Care Physician: Dina Richough, Robert Other  Clinician: Referring Physician: Treating Physician/Extender: Pincus Badderannon, Jennifer Dough, Robert Weeks in Treatment: 4 Education Assessment Education Provided To: Patient Education Topics Provided Wound/Skin Impairment: Methods: Explain/Verbal Responses: State content correctly Electronic Signature(s) Signed: 05/05/2021 6:27:47 PM By: Zandra AbtsLynch, [name] RN, BSN Entered By: Zandra AbtsLynch, [name] on 05/05/2021 12:57:08 -------------------------------------------------------------------------------- Wound Assessment Details Patient Name: Date of Service: MA Carla Stokes, Carla Stokes NDA C. 05/05/2021 8:30 A M Medical Record Number: [id number] Patient Account Number: [id number] Date of Birth/Sex: Treating RN: 06/02/1950 [age] y.o. Wynelle LinkF) Lynch, [name] Primary Care Lawan Nanez: Dina Richough, Robert Other Clinician: Referring Marcellius Montagna: Treating Kaniya Trueheart/Extender: Pincus Badderannon, Jennifer Dough, Robert Weeks in Treatment: 4 Wound Status Wound Number: 1 Primary Diabetic Wound/Ulcer of the Lower Extremity Etiology: Wound Location: Left, Lateral Lower Leg Secondary Venous Leg Ulcer Wounding Event: Trauma Etiology: Date Acquired: 01/27/2021 Wound Status: Open Weeks Of Treatment: 4 Comorbid Cataracts, Hypertension, Type II Diabetes, Osteoarthritis, Clustered Wound: No History: Neuropathy Photos Wound Measurements Length: (cm) 1 Width: (cm) 0.3 Depth: (cm) 0.1 Area: (cm) 0.236 Volume: (cm) 0.024 % Reduction in Area: 92% % Reduction in Volume: 91.9% Epithelialization: Medium (34-66%) Tunneling: No Undermining: No Wound Description Classification: Grade 1 Wound Margin: Flat and Intact Exudate Amount: Small Exudate Type: Serosanguineous Exudate Color: red, brown Foul Odor After Cleansing: No Slough/Fibrino Yes Wound Bed Granulation Amount: Large (67-100%) Exposed Structure Granulation Quality: Red, Pink Fascia Exposed: No Necrotic Amount: Small (1-33%) Fat Layer (Subcutaneous Tissue) Exposed: Yes Tendon Exposed:  No Muscle Exposed: No Joint Exposed: No Bone Exposed: No Treatment Notes Wound #1 (Lower Leg) Wound Laterality: Left, Lateral Cleanser Peri-Wound Care Sween Lotion (Moisturizing lotion) Discharge Instruction: Apply moisturizing lotion as directed Topical Primary Dressing Promogran Prisma Matrix, 4.34 (sq in) (silver collagen) Discharge Instruction: Moisten collagen with saline or hydrogel Secondary Dressing Woven Gauze Sponge, Non-Sterile 4x4 in Discharge Instruction: Apply over primary dressing as directed. Secured With Compression Wrap ThreePress (3 layer compression wrap) Discharge Instruction: Apply three layer compression as directed. Compression Stockings Add-Ons Electronic Signature(s) Signed: 05/05/2021 6:27:47 PM By: Zandra AbtsLynch, [name] RN, BSN Entered By: Zandra AbtsLynch, [name] on 05/05/2021 08:58:56 -------------------------------------------------------------------------------- Vitals Details Patient Name: Date of Service: [place] N[place]. 05/05/2021 8:30 A M Medical Record Number: [id number] Patient Account Number: [id number] Date of Birth/Sex: Treating RN: 05/11/1950 [age] y.o. F) Primary Care Evellyn Tuff: Dina Richough, Robert Other Clinician: Referring Marrissa Dai: Treating Hong Timm/Extender: Pincus Badderannon, Jennifer Dough, Robert Weeks in Treatment: 4 Vital Signs Time Taken: 08:44 Temperature (F): 98.1 Height (in): 67 Pulse (bpm): 56 Respiratory Rate (breaths/min): 18 Blood Pressure (mmHg): 144/76 Capillary Blood Glucose (mg/dl): 962188 Reference Range: 80 - 120 mg / dl Electronic Signature(s) Signed: 05/05/2021 9:49:12 AM By: Karl Itoawkins, Destiny Entered By: Karl Itoawkins, Destiny on 05/05/2021 08:44:23

## 2021-05-05 NOTE — Progress Notes (Signed)
AIREANNA, LUELLEN ([id number]) Visit Report for 05/05/2021 Chief Complaint Document Details Patient Name: Date of Service: MA [place], [name]. 05/05/2021 8:30 A M Medical Record Number: [id number] Patient Account Number: [id number] Date of Birth/Sex: Treating RN: Nov 25, 1950 (71 y.o. F) Primary Care Provider: Dina Rich Other Clinician: Referring Provider: Treating Provider/Extender: Pincus Badder in Treatment: 4 Information Obtained from: Patient Chief Complaint 05/05/2021; patient is here for review of wound on the left lateral lower leg Electronic Signature(s) Signed: 05/05/2021 9:15:55 AM By: Duanne Guess MD FACS Entered By: Duanne Guess on 05/05/2021 09:15:55 -------------------------------------------------------------------------------- Debridement Details Patient Name: Date of Service: [name], [name]. 05/05/2021 8:30 A M Medical Record Number: [id number] Patient Account Number: [id number] Date of Birth/Sex: Treating RN: January 18, 1951 (71 y.o. Wynelle Link Primary Care Provider: Dina Rich Other Clinician: Referring Provider: Treating Provider/Extender: Pincus Badder in Treatment: 4 Debridement Performed for Assessment: Wound #1 Left,Lateral Lower Leg Performed By: Physician Duanne Guess, MD Debridement Type: Debridement Severity of Tissue Pre Debridement: Fat layer exposed Level of Consciousness (Pre-procedure): Awake and Alert Pre-procedure Verification/Time Out Yes - 09:02 Taken: Start Time: 09:02 T Area Debrided (L x W): otal 0.3 (cm) x 0.3 (cm) = 0.09 (cm) Tissue and other material debrided: Non-Viable, Slough, Slough Level: Non-Viable Tissue Debridement Description: Selective/Open Wound Instrument: Curette Bleeding: Minimum Hemostasis Achieved: Pressure End Time: 09:03 Procedural Pain: 0 Post Procedural Pain: 0 Response to Treatment: Procedure was tolerated well Level of Consciousness (Post-  Awake and Alert procedure): Post Debridement Measurements of Total Wound Length: (cm) 1 Width: (cm) 0.3 Depth: (cm) 0.1 Volume: (cm) 0.024 Character of Wound/Ulcer Post Debridement: Improved Severity of Tissue Post Debridement: Fat layer exposed Post Procedure Diagnosis Same as Pre-procedure Electronic Signature(s) Signed: 05/05/2021 12:45:07 PM By: Duanne Guess MD FACS Signed: 05/05/2021 6:27:47 PM By: Zandra Abts RN, BSN Entered By: Zandra Abts on 05/05/2021 09:03:50 -------------------------------------------------------------------------------- HPI Details Patient Name: Date of Service: [name], [name] NDA C. 05/05/2021 8:30 A M Medical Record Number: [id number] Patient Account Number: [id number] Date of Birth/Sex: Treating RN: 04/04/1950 (71 y.o. F) Primary Care Provider: Dina Rich Other Clinician: Referring Provider: Treating Provider/Extender: Pincus Badder in Treatment: 4 History of Present Illness HPI Description: ADMISSION 04/07/2021 This is a 71 year old woman with type 2 diabetes. She states her problem began towards the end of November when she had a fall winter in the yard and injured her left lower leg. She has been followed by [facility]iatry with [facility]. Saw them on 03/02/2021 she was felt to have cellulitis of the left ankle and the left leg wound. She ultimately was felt to have a venous stasis ulcer and I believe was put in an [employer]. She is not wearing that currently. She had several further visits. The wound became increasingly painful and on 04/01/2021 she went to the emergency room. An x-ray of the area showed vascular calcification but no bony abnormality. She was put on doxycycline which she is still taking. She does not have a history of prior wounds or venous insufficiency that she knows about. Past medical history includes type 2 diabetes with a A1c of 7.5 in October, hypertension, gout, hypercholesterolemia, diabetic  foot ulcer and a history of PAD The patient follows with vascular surgery in [place]. He was able to see that she has previously had vascular evaluation most recently in March 2022. Although I do not have the exact numbers it is quoted in their last note  it is showing noncompressible ABIs but normal TBI's and normal waveforms. As such there should not be any difficulty with sufficient blood flow to heal this wound. 2/15; 2 small areas which were initially traumatic but then complicated by infection. We have been using Iodoflex. Both wounds had a very necrotic surface last week. 04/20/2021: 2 small areas that were initially traumatic and then complicated by infection. Iodoflex has been used with clear improvement. The wounds continue to contract and are smaller again today without any necrosis, but with adherent slough. 04/27/2021: The smaller, more distal area is almost closed. The Iodoflex is drying out too much and is pulling away tissue when it is removed. There is a small amount of slough present in the larger wound. There is minimal drainage. 05/05/2021: Last week, due to the Iodoflex causing some local trauma when it was removed, which switched to Prisma. There has been no significant change in her wounds since that time. They basically look the same as they did last week. Minimal drainage. Small amount of slough in the larger wound. Electronic Signature(s) Signed: 05/05/2021 9:17:12 AM By: Duanne Guess MD FACS Entered By: Duanne Guess on 05/05/2021 09:17:12 -------------------------------------------------------------------------------- Physical Exam Details Patient Name: Date of Service: [name], [name]. 05/05/2021 8:30 A M Medical Record Number: [id number] Patient Account Number: [id number] Date of Birth/Sex: Treating RN: September 27, 1950 (71 y.o. F) Primary Care Provider: Dina Rich Other Clinician: Referring Provider: Treating Provider/Extender: Pincus Badder in Treatment: 4 Constitutional . . . . No acute distress. Notes 05/05/2021: No significant change in the wounds from last week. The caudal smaller lesion is nearly closed, but appears the same as last week. The larger lesion has some slough which was debrided with a #3 curette. Electronic Signature(s) Signed: 05/05/2021 9:20:41 AM By: Duanne Guess MD FACS Entered By: Duanne Guess on 05/05/2021 09:20:41 -------------------------------------------------------------------------------- Physician Orders Details Patient Name: Date of Service: [name], [name]. 05/05/2021 8:30 A M Medical Record Number: [id number] Patient Account Number: [id number] Date of Birth/Sex: Treating RN: [birth year] (71 y.o. Wynelle Link Primary Care Provider: Dina Rich Other Clinician: Referring Provider: Treating Provider/Extender: Pincus Badder in Treatment: 4 Verbal / Phone Orders: No Diagnosis Coding ICD-10 Coding Code Description [phone] Abrasion, left lower leg, subsequent encounter L97.828 Non-pressure chronic ulcer of other part of left lower leg with other specified severity I87.332 Chronic venous hypertension (idiopathic) with ulcer and inflammation of left lower extremity Follow-up Appointments ppointment in 1 week. - Dr. Lady Gary room 1 Return A Bathing/ Shower/ Hygiene May shower with protection but do not get wound dressing(s) wet. Edema Control - Lymphedema / SCD / Other Left Lower Extremity Elevate legs to the level of the heart or above for 30 minutes daily and/or when sitting, a frequency of: - 3-4 times a day throughout the day. Avoid standing for long periods of time. Exercise regularly Moisturize legs daily. - right leg every night before bed. Compression stocking or Garment 20-30 mm/Hg pressure to: - right leg daily Wound Treatment Wound #1 - Lower Leg Wound Laterality: Left, Lateral Peri-Wound Care: Sween Lotion (Moisturizing lotion) 1  x Per Week Discharge Instructions: Apply moisturizing lotion as directed Prim Dressing: Promogran Prisma Matrix, 4.34 (sq in) (silver collagen) 1 x Per Week ary Discharge Instructions: Moisten collagen with saline or hydrogel Secondary Dressing: Woven Gauze Sponge, Non-Sterile 4x4 in 1 x Per Week Discharge Instructions: Apply over primary dressing as directed. Compression Wrap: ThreePress (3 layer compression wrap) 1 x  Per Week Discharge Instructions: Apply three layer compression as directed. Electronic Signature(s) Signed: 05/05/2021 12:45:07 PM By: Duanne Guess MD FACS Entered By: Duanne Guess on 05/05/2021 09:20:58 -------------------------------------------------------------------------------- Problem List Details Patient Name: Date of Service: MA [place], [name]. 05/05/2021 8:30 A M Medical Record Number: [id number] Patient Account Number: [id number] Date of Birth/Sex: Treating RN: 1950-11-27 (71 y.o. Wynelle Link Primary Care Provider: Dina Rich Other Clinician: Referring Provider: Treating Provider/Extender: Pincus Badder in Treatment: 4 Active Problems ICD-10 Encounter Code Description Active Date MDM Diagnosis S80.812D Abrasion, left lower leg, subsequent encounter 04/07/2021 No Yes L97.828 Non-pressure chronic ulcer of other part of left lower leg with other specified 04/07/2021 No Yes severity I87.332 Chronic venous hypertension (idiopathic) with ulcer and inflammation of left 04/07/2021 No Yes lower extremity Inactive Problems Resolved Problems Electronic Signature(s) Signed: 05/05/2021 9:14:40 AM By: Duanne Guess MD FACS Entered By: Duanne Guess on 05/05/2021 09:14:40 -------------------------------------------------------------------------------- Progress Note Details Patient Name: Date of Service: [name], [name] NDA C. 05/05/2021 8:30 A M Medical Record Number: [id number] Patient Account Number: [id number] Date of Birth/Sex:  Treating RN: 05/08/1950 (72 y.o. F) Primary Care Provider: Dina Rich Other Clinician: Referring Provider: Treating Provider/Extender: Pincus Badder in Treatment: 4 Subjective Chief Complaint Information obtained from Patient 05/05/2021; patient is here for review of wound on the left lateral lower leg History of Present Illness (HPI) ADMISSION 04/07/2021 This is a 71 year old woman with type 2 diabetes. She states her problem began towards the end of November when she had a fall winter in the yard and injured her left lower leg. She has been followed [facility] with [facility]. Saw them on 03/02/2021 she was felt to have cellulitis of the left ankle and the left leg wound. She ultimately was felt to have a venous stasis ulcer and I believe was put in an [employer]. She is not wearing that currently. She had several further visits. The wound became increasingly painful and on 04/01/2021 she went to the emergency room. An x-ray of the area showed vascular calcification but no bony abnormality. She was put on doxycycline which she is still taking. She does not have a history of prior wounds or venous insufficiency that she knows about. Past medical history includes type 2 diabetes with a A1c of 7.5 in October, hypertension, gout, hypercholesterolemia, diabetic foot ulcer and a history of PAD The patient follows with vascular surgery in [place]. He was able to see that she has previously had vascular evaluation most recently in March 2022. Although I do not have the exact numbers it is quoted in their last note it is showing noncompressible ABIs but normal TBI's and normal waveforms. As such there should not be any difficulty with sufficient blood flow to heal this wound. 2/15; 2 small areas which were initially traumatic but then complicated by infection. We have been using Iodoflex. Both wounds had a very necrotic surface last week. 04/20/2021: 2 small areas that  were initially traumatic and then complicated by infection. Iodoflex has been used with clear improvement. The wounds continue to contract and are smaller again today without any necrosis, but with adherent slough. 04/27/2021: The smaller, more distal area is almost closed. The Iodoflex is drying out too much and is pulling away tissue when it is removed. There is a small amount of slough present in the larger wound. There is minimal drainage. 05/05/2021: Last week, due to the Iodoflex causing some local trauma when  it was removed, which switched to Prisma. There has been no significant change in her wounds since that time. They basically look the same as they did last week. Minimal drainage. Small amount of slough in the larger wound. Patient History Information obtained from Patient, Chart. Family History Cancer - Siblings,Father, Diabetes - Siblings, Heart Disease - Siblings,Mother, Hypertension - Siblings,Mother, Lung Disease - Siblings, Stroke - Siblings,Mother, Thyroid Problems - Siblings, No family history of Hereditary Spherocytosis, Kidney Disease, Seizures, Tuberculosis. Social History Never smoker, Marital Status - Divorced, Alcohol Use - Rarely, Drug Use - No History, Caffeine Use - Moderate - coffee. Medical History Eyes Patient has history of Cataracts - left eye removed Denies history of Glaucoma Cardiovascular Patient has history of Hypertension Endocrine Patient has history of Type II Diabetes Denies history of Type I Diabetes Integumentary (Skin) Denies history of History of Burn Musculoskeletal Patient has history of Osteoarthritis Denies history of Gout, Rheumatoid Arthritis, Osteomyelitis Neurologic Patient has history of Neuropathy Oncologic Denies history of Received Chemotherapy, Received Radiation Psychiatric Denies history of Anorexia/bulimia, Confinement Anxiety Hospitalization/Surgery History - left cataract extraction. - c-section. - total abdominal  hysterectomy w/ bil salpingoophrectomy. - wisdom tooth extraction. Medical A Surgical History Notes nd Constitutional Symptoms (General Health) obesity Cardiovascular hyperlipidemia Objective Constitutional No acute distress. Vitals Time Taken: 8:44 AM, Height: 67 in, Temperature: 98.1 F, Pulse: 56 bpm, Respiratory Rate: 18 breaths/min, Blood Pressure: 144/76 mmHg, Capillary Blood Glucose: 188 mg/dl. General Notes: 05/05/2021: No significant change in the wounds from last week. The caudal smaller lesion is nearly closed, but appears the same as last week. The larger lesion has some slough which was debrided with a #3 curette. Integumentary (Hair, Skin) Wound #1 status is Open. Original cause of wound was Trauma. The date acquired was: 01/27/2021. The wound has been in treatment 4 weeks. The wound is located on the Left,Lateral Lower Leg. The wound measures 1cm length x 0.3cm width x 0.1cm depth; 0.236cm^2 area and 0.024cm^3 volume. There is Fat Layer (Subcutaneous Tissue) exposed. There is no tunneling or undermining noted. There is a small amount of serosanguineous drainage noted. The wound margin is flat and intact. There is large (67-100%) red, pink granulation within the wound bed. There is a small (1-33%) amount of necrotic tissue within the wound bed. Assessment Active Problems ICD-10 Abrasion, left lower leg, subsequent encounter Non-pressure chronic ulcer of other part of left lower leg with other specified severity Chronic venous hypertension (idiopathic) with ulcer and inflammation of left lower extremity Procedures Wound #1 Pre-procedure diagnosis of Wound #1 is a Diabetic Wound/Ulcer of the Lower Extremity located on the Left,Lateral Lower Leg .Severity of Tissue Pre Debridement is: Fat layer exposed. There was a Selective/Open Wound Non-Viable Tissue Debridement with a total area of 0.09 sq cm performed by Duanne Guess, MD. With the following instrument(s): Curette to  remove Non-Viable tissue/material. Material removed includes [facility]. No specimens were taken. A time out was conducted at 09:02, prior to the start of the procedure. A Minimum amount of bleeding was controlled with Pressure. The procedure was tolerated well with a pain level of 0 throughout and a pain level of 0 following the procedure. Post Debridement Measurements: 1cm length x 0.3cm width x 0.1cm depth; 0.024cm^3 volume. Character of Wound/Ulcer Post Debridement is improved. Severity of Tissue Post Debridement is: Fat layer exposed. Post procedure Diagnosis Wound #1: Same as Pre-Procedure Pre-procedure diagnosis of Wound #1 is a Diabetic Wound/Ulcer of the Lower Extremity located on the Left,Lateral Lower Leg .  There was a Three Layer Compression Therapy Procedure by Zandra AbtsLynch, [name], RN. Post procedure Diagnosis Wound #1: Same as Pre-Procedure Plan Follow-up Appointments: Return Appointment in 1 week. - Dr. Lady Garyannon room 1 Bathing/ Shower/ Hygiene: May shower with protection but do not get wound dressing(s) wet. Edema Control - Lymphedema / SCD / Other: Elevate legs to the level of the heart or above for 30 minutes daily and/or when sitting, a frequency of: - 3-4 times a day throughout the day. Avoid standing for long periods of time. Exercise regularly Moisturize legs daily. - right leg every night before bed. Compression stocking or Garment 20-30 mm/Hg pressure to: - right leg daily WOUND #1: - Lower Leg Wound Laterality: Left, Lateral Peri-Wound Care: Sween Lotion (Moisturizing lotion) 1 x Per Week/ Discharge Instructions: Apply moisturizing lotion as directed Prim Dressing: Promogran Prisma Matrix, 4.34 (sq in) (silver collagen) 1 x Per Week/ ary Discharge Instructions: Moisten collagen with saline or hydrogel Secondary Dressing: Woven Gauze Sponge, Non-Sterile 4x4 in 1 x Per Week/ Discharge Instructions: Apply over primary dressing as directed. Com pression Wrap: ThreePress (3 layer  compression wrap) 1 x Per Week/ Discharge Instructions: Apply three layer compression as directed. 05/05/2021: No significant change in the wounds from last week. The caudal smaller lesion is nearly closed, but appears the same as last week. The larger lesion has some slough which was debrided with a #3 curette. She did see vascular surgery yesterday, but unfortunately, the results of her studies are not visible to me in the electronic medical record. She was told she did not need to return for 1 year. She did say that they were asking about venous studies, but I am not entirely sure what their question is, perhaps they want to know if I want venous reflux studies. We changed to Prisma from Iodoflex last week, but there has been no progress in healing since last week. We will give the Prisma another week to see if we make any progress, but if not, we may need to return to Iodoflex as we were getting good healing. The issue with that product was that it seemed to dry out a bit too much and was causing tissue trauma when it was removed. Continue Prisma, 3 layer compression, and follow-up in 1 week. Electronic Signature(s) Signed: 05/05/2021 9:22:49 AM By: Duanne Guessannon, Kolden Dupee MD FACS Entered By: Duanne Guessannon, Jourdyn Ferrin on 05/05/2021 09:22:49 -------------------------------------------------------------------------------- HxROS Details Patient Name: Date of Service: [place], [place] NDA C. 05/05/2021 8:30 A M Medical Record Number: [id number] Patient Account Number: [id number] Date of Birth/Sex: Treating RN: 11/29/1950 [age] y.o. F) Primary Care Provider: Dina Richough, Robert Other Clinician: Referring Provider: Treating Provider/Extender: Pincus Badderannon, Ainhoa Rallo Dough, Robert Weeks in Treatment: 4 Information Obtained From Patient Chart Constitutional Symptoms (General Health) Medical History: Past Medical History Notes: obesity Eyes Medical History: Positive for: Cataracts - left eye removed Negative for:  Glaucoma Cardiovascular Medical History: Positive for: Hypertension Past Medical History Notes: hyperlipidemia Endocrine Medical History: Positive for: Type II Diabetes Negative for: Type I Diabetes Time with diabetes: >15 years Treated with: Insulin, Oral agents Blood sugar tested every day: Yes Tested : Integumentary (Skin) Medical History: Negative for: History of Burn Musculoskeletal Medical History: Positive for: Osteoarthritis Negative for: Gout; Rheumatoid Arthritis; Osteomyelitis Neurologic Medical History: Positive for: Neuropathy Oncologic Medical History: Negative for: Received Chemotherapy; Received Radiation Psychiatric Medical History: Negative for: Anorexia/bulimia; Confinement Anxiety HBO Extended History Items Eyes: Cataracts Immunizations Pneumococcal Vaccine: Received Pneumococcal Vaccination: Yes Received Pneumococcal Vaccination On or After 60th Birthday: Yes  Implantable Devices None Hospitalization / Surgery History Type of Hospitalization/Surgery left cataract extraction c-section total abdominal hysterectomy w/ bil salpingoophrectomy wisdom tooth extraction Family and Social History Cancer: Yes - Siblings,Father; Diabetes: Yes - Siblings; Heart Disease: Yes - Siblings,Mother; Hereditary Spherocytosis: No; Hypertension: Yes - Siblings,Mother; Kidney Disease: No; Lung Disease: Yes - Siblings; Seizures: No; Stroke: Yes - Siblings,Mother; Thyroid Problems: Yes - Siblings; Tuberculosis: No; Never smoker; Marital Status - Divorced; Alcohol Use: Rarely; Drug Use: No History; Caffeine Use: Moderate - coffee; Financial Concerns: No; Food, Clothing or Shelter Needs: No; Support System Lacking: No; Transportation Concerns: No Physician Affirmation I have reviewed and agree with the above information. Electronic Signature(s) Signed: 05/05/2021 12:45:07 PM By: Duanne Guess MD FACS Entered By: Duanne Guess on 05/05/2021  09:18:11 -------------------------------------------------------------------------------- SuperBill Details Patient Name: Date of Service: [name], [name]. 05/05/2021 Medical Record Number: [id number] Patient Account Number: [id number] Date of Birth/Sex: Treating RN: [birth year] (71 y.o. F) Primary Care Provider: Dina Rich Other Clinician: Referring Provider: Treating Provider/Extender: Pincus Badder in Treatment: 4 Diagnosis Coding ICD-10 Codes Code Description [phone] Abrasion, left lower leg, subsequent encounter L97.828 Non-pressure chronic ulcer of other part of left lower leg with other specified severity I87.332 Chronic venous hypertension (idiopathic) with ulcer and inflammation of left lower extremity Facility Procedures CPT4 Code: [id number] Description: [phone] - DEBRIDE WOUND 1ST 20 SQ CM OR < ICD-10 Diagnosis Description L97.828 Non-pressure chronic ulcer of other part of left lower leg with other specified se Modifier: verity Quantity: 1 Physician Procedures : CPT4 Code Description Modifier [id number] - WC PHYS DEBR WO ANESTH 20 SQ CM ICD-10 Diagnosis Description L97.828 Non-pressure chronic ulcer of other part of left lower leg with other specified severity Quantity: 1 Electronic Signature(s) Signed: 05/05/2021 9:23:00 AM By: Duanne Guess MD FACS Entered By: Duanne Guess on 05/05/2021 09:23:00

## 2021-05-07 ENCOUNTER — Encounter (HOSPITAL_BASED_OUTPATIENT_CLINIC_OR_DEPARTMENT_OTHER): Payer: Medicare HMO | Admitting: General Surgery

## 2021-05-07 ENCOUNTER — Other Ambulatory Visit: Payer: Self-pay

## 2021-05-07 DIAGNOSIS — S80812A Abrasion, left lower leg, initial encounter: Secondary | ICD-10-CM | POA: Diagnosis not present

## 2021-05-10 NOTE — Progress Notes (Signed)
Carla Stokes, Carla Stokes ([id number]) ?Visit Report for 05/07/2021 ?Arrival Information Details ?Patient Name: Date of Service: ?[name], [name]. 05/07/2021 9:00 A M ?Medical Record Number: [id number] ?Patient Account Number: [id number] ?Date of Birth/Sex: Treating RN: ?October 13, 1950 (71 y.o. F) ?Primary Care Azizi Bally: Dina Rich Other Clinician: ?Referring Meckenzie Balsley: ?Treating Yordin Rhoda/Extender: Duanne Guess ?Dina Rich ?Weeks in Treatment: 4 ?Visit Information History Since Last Visit ?Added or deleted any medications: No ?Patient Arrived: Ambulatory ?Any new allergies or adverse reactions: No ?Arrival Time: 09:12 ?Had a fall or experienced change in No ?Accompanied By: self ?activities of daily living that may affect ?Transfer Assistance: None ?risk of falls: ?Patient Identification Verified: Yes ?Signs or symptoms of abuse/neglect since last visito No ?Secondary Verification Process Completed: Yes ?Hospitalized since last visit: No ?Patient Requires Transmission-Based Precautions: No ?Implantable device outside of the clinic excluding No ?Patient Has Alerts: Yes ?cellular tissue based products placed in the center ?Patient Alerts: Bil ABIs N/C, TBIs WNL since last visit: ?Has Dressing in Place as Prescribed: Yes ?Pain Present Now: No ?Electronic Signature(s) ?Signed: 05/10/2021 8:20:16 AM By: Karl Ito ?Entered By: Karl Ito on 05/07/2021 09:13:06 ?-------------------------------------------------------------------------------- ?Compression Therapy Details ?Patient Name: Date of Service: ?[name], [name]. 05/07/2021 9:00 A M ?Medical Record Number: [id number] ?Patient Account Number: [id number] ?Date of Birth/Sex: Treating RN: ?03/05/1950 (71 y.o. Carla Stokes, Carla Stokes ?Primary Care Niamh Rada: Dina Rich Other Clinician: ?Referring Graycee Greeson: ?Treating Emalynn Clewis/Extender: Duanne Guess ?Dina Rich ?Weeks in Treatment: 4 ?Compression Therapy Performed for Wound Assessment: Wound #1 Left,Lateral Lower  Leg ?Performed By: Clinician Zandra Abts, RN ?Compression Type: Three Layer ?Electronic Signature(s) ?Signed: 05/07/2021 1:38:56 PM By: Zandra Abts RN, BSN ?Entered By: Zandra Abts on 05/07/2021 09:40:24 ?-------------------------------------------------------------------------------- ?Encounter Discharge Information Details ?Patient Name: Date of Service: ?[name], [name]. 05/07/2021 9:00 A M ?Medical Record Number: [id number] ?Patient Account Number: [id number] ?Date of Birth/Sex: ?Treating RN: ?01-07-1951 (71 y.o. Carla Stokes, Carla Stokes ?Primary Care Endi Lagman: ?Other Clinician: ?Dina Rich ?Referring Elion Hocker: ?Treating Eithan Beagle/Extender: Duanne Guess ?Dina Rich ?Weeks in Treatment: 4 ?Encounter Discharge Information Items ?Discharge Condition: Stable ?Ambulatory Status: Ambulatory ?Discharge Destination: Home ?Transportation: Private Auto ?Accompanied By: alone ?Schedule Follow-up Appointment: Yes ?Clinical Summary of Care: Patient Declined ?Electronic Signature(s) ?Signed: 05/07/2021 1:38:56 PM By: Zandra Abts RN, BSN ?Entered By: Zandra Abts on 05/07/2021 09:41:19 ?-------------------------------------------------------------------------------- ?Wound Assessment Details ?Patient Name: ?Date of Service: ?[name], [name]. 05/07/2021 9:00 A M ?Medical Record Number: [id number] ?Patient Account Number: [id number] ?Date of Birth/Sex: ?Treating RN: ?Jul 21, 1950 (71 y.o. F) ?Primary Care Merville Hijazi: Dina Rich ?Other Clinician: ?Referring Kaiulani Sitton: ?Treating Ceri Mayer/Extender: Duanne Guess ?Dina Rich ?Weeks in Treatment: 4 ?Wound Status ?Wound Number: 1 ?Primary Etiology: Diabetic Wound/Ulcer of the Lower Extremity ?Wound Location: Left, Lateral Lower Leg ?Secondary Etiology: Venous Leg Ulcer ?Wounding Event: Trauma ?Wound Status: Open ?Date Acquired: 01/27/2021 ?Weeks Of Treatment: 4 ?Clustered Wound: No ?Wound Measurements ?Length: (cm) 1 ?Width: (cm) 0.3 ?Depth: (cm) 0.1 ?Area: (cm?)  0.236 ?Volume: (cm?) 0.024 ?% Reduction in Area: 92% ?% Reduction in Volume: 91.9% ?Wound Description ?Classification: Grade 1 ?Exudate Amount: Small ?Exudate Type: Serosanguineous ?Exudate Color: red, brown ?Treatment Notes ?Wound #1 (Lower Leg) Wound Laterality: Left, Lateral ?Cleanser ?Peri-Wound Care ?Sween Lotion (Moisturizing lotion) ?Discharge Instruction: Apply moisturizing lotion as directed ?Topical ?Primary Dressing ?Promogran Prisma Matrix, 4.34 (sq in) (silver collagen) ?Discharge Instruction: Moisten collagen with saline or hydrogel ?Secondary Dressing ?Woven Gauze Sponge, Non-Sterile 4x4 in ?Discharge Instruction: Apply over primary dressing as directed. ?Secured With ?Compression Wrap ?ThreePress (3 layer compression wrap) ?  Discharge Instruction: Apply three layer compression as directed. ?Compression Stockings ?Add-Ons ?Electronic Signature(s) ?Signed: 05/10/2021 8:20:16 AM By: Karl Ito ?Entered By: Karl Ito on 05/07/2021 09:13:33 ?-------------------------------------------------------------------------------- ?Vitals Details ?Patient Name: ?Date of Service: ?[name], [name]. 05/07/2021 9:00 A M ?Medical Record Number: [id number] ?Patient Account Number: [id number] ?Date of Birth/Sex: ?Treating RN: ?10-26-1950 (71 y.o. F) ?Primary Care Jezelle Gullick: Dina Rich ?Other Clinician: ?Referring Keltin Baird: ?Treating Rodriques Badie/Extender: Duanne Guess ?Dina Rich ?Weeks in Treatment: 4 ?Vital Signs ?Time Taken: 09:13 ?Temperature (??F): 98.1 ?Height (in): 67 ?Pulse (bpm): 56 ?Respiratory Rate (breaths/min): 18 ?Blood Pressure (mmHg): 144/76 ?Capillary Blood Glucose (mg/dl): 109 ?Reference Range: 80 - 120 mg / dl ?Electronic Signature(s) ?Signed: 05/10/2021 8:20:16 AM By: Karl Ito ?Entered By: Karl Ito on 05/07/2021 09:13:22 ?

## 2021-05-10 NOTE — Progress Notes (Signed)
[name], Carla Stokes ([id number]) ?Visit Report for 05/07/2021 ?SuperBill Details ?Patient Name: Date of Service: ?[name], [name]. 05/07/2021 ?Medical Record Number: [id number] ?Patient Account Number: [id number] ?Date of Birth/Sex: Treating RN: ?16-Jul-1950 (71 y.o. 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

## 2021-05-11 ENCOUNTER — Other Ambulatory Visit: Payer: Self-pay

## 2021-05-11 ENCOUNTER — Encounter (HOSPITAL_BASED_OUTPATIENT_CLINIC_OR_DEPARTMENT_OTHER): Payer: Medicare HMO | Admitting: General Surgery

## 2021-05-11 DIAGNOSIS — S80812A Abrasion, left lower leg, initial encounter: Secondary | ICD-10-CM | POA: Diagnosis not present

## 2021-05-11 NOTE — Progress Notes (Signed)
JEANNINE, PENNISI ([id number]) ?Visit Report for 05/11/2021 ?Arrival Information Details ?Patient Name: Date of Service: ?Arlyss [place], [name]. 05/11/2021 10:00 A M ?Medical Record Number: [id number] ?Patient Account Number: [id number] ?Date of Birth/Sex: Treating RN: ?07/19/1950 (71 y.o. Roel Cluck ?Primary Care Andriy Sherk: Dina Rich Other Clinician: ?Referring Foxx Klarich: ?Treating Hafsah Hendler/Extender: Duanne Guess ?Dina Rich ?Weeks in Treatment: 4 ?Visit Information History Since Last Visit ?Added or deleted any medications: No ?Patient Arrived: Ambulatory ?Any new allergies or adverse reactions: No ?Arrival Time: 10:09 ?Had a fall or experienced change in No ?Transfer Assistance: None ?activities of daily living that may affect ?Patient Identification Verified: Yes ?risk of falls: ?Secondary Verification Process Completed: Yes ?Signs or symptoms of abuse/neglect since last visito No ?Patient Requires Transmission-Based Precautions: No ?Hospitalized since last visit: No ?Patient Has Alerts: Yes ?Implantable device outside of the clinic excluding No ?Patient Alerts: Bil ABIs N/C, TBIs WNL ?cellular tissue based products placed in the center ?since last visit: ?Has Dressing in Place as Prescribed: Yes ?Has Compression in Place as Prescribed: Yes ?Pain Present Now: No ?Electronic Signature(s) ?Signed: 05/11/2021 5:20:09 PM By: Antonieta Iba ?Entered By: Antonieta Iba on 05/11/2021 10:13:01 ?-------------------------------------------------------------------------------- ?Compression Therapy Details ?Patient Name: Date of Service: ?Arlyss Queen, [name]. 05/11/2021 10:00 A M ?Medical Record Number: [id number] ?Patient Account Number: [id number] ?Date of Birth/Sex: Treating RN: ?1951/02/14 (71 y.o. Roel Cluck ?Primary Care Greg Eckrich: Dina Rich Other Clinician: ?Referring Andie Mortimer: ?Treating Merilee Wible/Extender: Duanne Guess ?Dina Rich ?Weeks in Treatment: 4 ?Compression Therapy Performed for Wound  Assessment: Wound #1 Left,Lateral Lower Leg ?Performed By: Clinician Antonieta Iba, RN ?Compression Type: Three Layer ?Post Procedure Diagnosis ?Same as Pre-procedure ?Electronic Signature(s) ?Signed: 05/11/2021 5:20:09 PM By: Antonieta Iba ?Entered By: Antonieta Iba on 05/11/2021 10:42:47 ?-------------------------------------------------------------------------------- ?Encounter Discharge Information Details ?Patient Name: ?Date of Service: ?MA [place], [name]. 05/11/2021 10:00 A M ?Medical Record Number: [id number] ?Patient Account Number: [id number] ?Date of Birth/Sex: ?Treating RN: ?1950/06/18 (71 y.o. Roel Cluck ?Primary Care Jaedan Huttner: Dina Rich ?Other Clinician: ?Referring Laverda Stribling: ?Treating Chyla Schlender/Extender: Duanne Guess ?Dina Rich ?Weeks in Treatment: 4 ?Encounter Discharge Information Items Post Procedure Vitals ?Discharge Condition: Stable ?Temperature (F): 98.6 ?Ambulatory Status: Ambulatory ?Pulse (bpm): 66 ?Discharge Destination: Home ?Respiratory Rate (breaths/min): 18 ?Transportation: Private Auto ?Blood Pressure (mmHg): 156/76 ?Schedule Follow-up Appointment: Yes ?Clinical Summary of Care: Provided on 05/11/2021 ?Form Type Recipient ?Paper Patient Patient ?Electronic Signature(s) ?Signed: 05/11/2021 5:20:09 PM By: Antonieta Iba ?Entered By: Antonieta Iba on 05/11/2021 11:03:01 ?-------------------------------------------------------------------------------- ?Lower Extremity Assessment Details ?Patient Name: ?Date of Service: ?MA [place], [name]. 05/11/2021 10:00 A M ?Medical Record Number: [id number] ?Patient Account Number: [id number] ?Date of Birth/Sex: ?Treating RN: ?[birth year] (71 y.o. Roel Cluck ?Primary Care Ercilia Bettinger: Dina Rich ?Other Clinician: ?Referring Sahir Tolson: ?Treating Gerhart Ruggieri/Extender: Duanne Guess ?Dina Rich ?Weeks in Treatment: 4 ?Edema Assessment ?Assessed: [Left: Yes] [Right: No] ?Edema: [Left: Ye] [Right: s] ?Calf ?Left: Right: ?Point of  Measurement: From Medial Instep 35.5 cm ?Ankle ?Left: Right: ?Point of Measurement: From Medial Instep 22.5 cm ?Vascular Assessment ?Pulses: ?Dorsalis Pedis ?Palpable: [Left:Yes] ?Electronic Signature(s) ?Signed: 05/11/2021 5:20:09 PM By: Antonieta Iba ?Entered By: Antonieta Iba on 05/11/2021 10:17:44 ?-------------------------------------------------------------------------------- ?Multi Wound Chart Details ?Patient Name: ?Date of Service: ?MA [place], [name]. 05/11/2021 10:00 A M ?Medical Record Number: [id number] ?Patient Account Number: [id number] ?Date of Birth/Sex: ?Treating RN: ?05-Oct-1950 (71 y.o. F) ?Primary Care Michial Disney: Dina Rich ?Other Clinician: ?Referring Ahaana Rochette: ?Treating Tigran Haynie/Extender: Duanne Guess ?Dina Rich ?Weeks in Treatment: 4 ?Vital Signs ?Height(in): 67 ?Capillary Blood  Glucose(mg/dl): 546 ?Weight(lbs): ?Pulse(bpm): 66 ?Body Mass Index(BMI): ?Blood Pressure(mmHg): 156/76 ?Temperature(??F): 98.6 ?Respiratory Rate(breaths/min): 18 ?Photos: [N/A:N/A] ?Left, Lateral Lower Leg N/A N/A ?Wound Location: ?Trauma N/A N/A ?Wounding Event: ?Diabetic Wound/Ulcer of the Lower N/A N/A ?Primary Etiology: ?Extremity ?Venous Leg Ulcer N/A N/A ?Secondary Etiology: ?Cataracts, Hypertension, Type II N/A N/A ?Comorbid History: ?Diabetes, Osteoarthritis, Neuropathy ?01/27/2021 N/A N/A ?Date Acquired: ?4 N/A N/A ?Weeks of Treatment: ?Open N/A N/A ?Wound Status: ?No N/A N/A ?Wound Recurrence: ?0.5x0.6x0.1 N/A N/A ?Measurements L x W x D (cm) ?0.236 N/A N/A ?A (cm?) : ?rea ?0.024 N/A N/A ?Volume (cm?) : ?92.00% N/A N/A ?% Reduction in A rea: ?91.90% N/A N/A ?% Reduction in Volume: ?Grade 1 N/A N/A ?Classification: ?Medium N/A N/A ?Exudate A mount: ?Serosanguineous N/A N/A ?Exudate Type: ?red, brown N/A N/A ?Exudate Color: ?Distinct, outline attached N/A N/A ?Wound Margin: ?Large (67-100%) N/A N/A ?Granulation A mount: ?Red, Pink N/A N/A ?Granulation Quality: ?None Present (0%) N/A N/A ?Necrotic A  mount: ?Fat Layer (Subcutaneous Tissue): Yes N/A N/A ?Exposed Structures: ?Fascia: No ?Tendon: No ?Muscle: No ?Joint: No ?Bone: No ?Large (67-100%) N/A N/A ?Epithelialization: ?Debridement - Selective/Open Wound N/A N/A ?Debridement: ?Pre-procedure Verification/Time Out 10:35 N/A N/A ?Taken: ?Other N/A N/A ?Pain Control: ?Slough N/A N/A ?Tissue Debrided: ?Non-Viable Tissue N/A N/A ?Level: ?0.3 N/A N/A ?Debridement A (sq cm): ?rea ?Curette N/A N/A ?Instrument: ?Minimum N/A N/A ?Bleeding: ?Pressure N/A N/A ?Hemostasis A chieved: ?Procedure was tolerated well N/A N/A ?Debridement Treatment Response: ?0.5x0.6x0.1 N/A N/A ?Post Debridement Measurements L x ?W x D (cm) ?0.024 N/A N/A ?Post Debridement Volume: (cm?) ?Compression Therapy N/A N/A ?Procedures Performed: ?Debridement ?Treatment Notes ?Wound #1 (Lower Leg) Wound Laterality: Left, Lateral ?Cleanser ?Soap and Water ?Discharge Instruction: May shower and wash wound with dial antibacterial soap and water prior to dressing change. ?Wound Cleanser ?Discharge Instruction: Cleanse the wound with wound cleanser prior to applying a clean dressing using gauze sponges, not tissue or cotton balls. ?Peri-Wound Care ?Sween Lotion (Moisturizing lotion) ?Discharge Instruction: Apply moisturizing lotion as directed ?Topical ?Primary Dressing ?Promogran Prisma Matrix, 4.34 (sq in) (silver collagen) ?Discharge Instruction: Moisten collagen with saline or hydrogel ?Secondary Dressing ?Woven Gauze Sponge, Non-Sterile 4x4 in ?Discharge Instruction: Apply over primary dressing as directed. ?Secured With ?Compression Wrap ?ThreePress (3 layer compression wrap) ?Discharge Instruction: Apply three layer compression as directed. ?Compression Stockings ?Add-Ons ?Electronic Signature(s) ?Signed: 05/11/2021 5:14:38 PM By: Duanne Guess MD FACS ?Entered By: Duanne Guess on 05/11/2021  17:14:38 ?-------------------------------------------------------------------------------- ?Multi-Disciplinary Care Plan Details ?Patient Name: ?Date of Service: ?MA [place], [name]. 05/11/2021 10:00 A M ?Medical Record Number: [id number] ?Patient Account Number: [id number] ?Date of Birth/Sex: ?Treating RN: ?[birth year]

## 2021-05-12 ENCOUNTER — Other Ambulatory Visit (HOSPITAL_COMMUNITY): Payer: Self-pay

## 2021-05-12 NOTE — Progress Notes (Signed)
BREECE, BROWE ([id number]) ?Visit Report for 05/11/2021 ?Chief Complaint Document Details ?Patient Name: Date of Service: ?Carla Stokes, [name] NDA C. 05/11/2021 10:00 A M ?Medical Record Number: [id number] ?Patient Account Number: [id number] ?Date of Birth/Sex: Treating RN: ?1951/02/11 (71 y.o. F) ?Primary Care Provider: Teressa Lower Other Clinician: ?Referring Provider: ?Treating Provider/Extender: Fredirick Maudlin ?Teressa Lower ?Weeks in Treatment: 4 ?Information Obtained from: Patient ?Chief Complaint ?05/05/2021; patient is here for review of wound on the left lateral lower leg ?Electronic Signature(s) ?Signed: 05/11/2021 5:14:54 PM By: Fredirick Maudlin MD FACS ?Entered By: Fredirick Maudlin on 05/11/2021 17:14:53 ?-------------------------------------------------------------------------------- ?Debridement Details ?Patient Name: Date of Service: ?Carla Stokes, [name] NDA C. 05/11/2021 10:00 A M ?Medical Record Number: [id number] ?Patient Account Number: [id number] ?Date of Birth/Sex: Treating RN: ?07/13/1950 (71 y.o. Sue Lush ?Primary Care Provider: Teressa Lower Other Clinician: ?Referring Provider: ?Treating Provider/Extender: Fredirick Maudlin ?Teressa Lower ?Weeks in Treatment: 4 ?Debridement Performed for Assessment: Wound #1 Left,Lateral Lower Leg ?Performed By: Physician Fredirick Maudlin, MD ?Debridement Type: Debridement ?Severity of Tissue Pre Debridement: Fat layer exposed ?Level of Consciousness (Pre-procedure): Awake and Alert ?Pre-procedure Verification/Time Out Yes - 10:35 ?Taken: ?Start Time: 10:36 ?Pain Control: ?Other : benzocaine 20% ?T Area Debrided (L x W): ?otal 0.5 (cm) x 0.6 (cm) = 0.3 (cm?) ?Tissue and other material debrided: Non-Viable, [place], [place] ?Level: Non-Viable Tissue ?Debridement Description: Selective/Open Wound ?Instrument: Curette ?Bleeding: Minimum ?Hemostasis Achieved: Pressure ?End Time: 10:42 ?Response to Treatment: Procedure was tolerated well ?Level of Consciousness (Post- Awake  and Alert ?procedure): ?Post Debridement Measurements of Total Wound ?Length: (cm) 0.5 ?Width: (cm) 0.6 ?Depth: (cm) 0.1 ?Volume: (cm?) 0.024 ?Character of Wound/Ulcer Post Debridement: Stable ?Severity of Tissue Post Debridement: Fat layer exposed ?Post Procedure Diagnosis ?Same as Pre-procedure ?Electronic Signature(s) ?Signed: 05/11/2021 5:20:09 PM By: Lorrin Jackson ?Signed: 05/12/2021 6:42:44 PM By: Fredirick Maudlin MD FACS ?Entered By: Lorrin Jackson on 05/11/2021 10:42:25 ?-------------------------------------------------------------------------------- ?HPI Details ?Patient Name: Date of Service: ?Carla Stokes, [name] NDA C. 05/11/2021 10:00 A M ?Medical Record Number: [id number] ?Patient Account Number: [id number] ?Date of Birth/Sex: Treating RN: ?12/06/1950 (71 y.o. F) ?Primary Care Provider: Teressa Lower Other Clinician: ?Referring Provider: ?Treating Provider/Extender: Fredirick Maudlin ?Teressa Lower ?Weeks in Treatment: 4 ?History of Present Illness ?HPI Description: ADMISSION ?04/07/2021 ?This is a 71 year old woman with type 2 diabetes. She states her problem began towards the end of November when she had a fall winter in the yard and ?injured her left lower leg. She has been followed by podiatry with [facility]. Saw them on 03/02/2021 she was felt to have cellulitis of the left ankle and ?the left leg wound. She ultimately was felt to have a venous stasis ulcer and I believe was put in an [employer]. She is not wearing that currently. She had ?several further visits. The wound became increasingly painful and on 04/01/2021 she went to the emergency room. An x-ray of the area showed vascular ?calcification but no bony abnormality. She was put on doxycycline which she is still taking. She does not have a history of prior wounds or venous ?insufficiency that she knows about. ?Past medical history includes type 2 diabetes with a A1c of 7.5 in October, hypertension, gout, hypercholesterolemia, diabetic foot ulcer  and a history of PAD ?The patient follows with vascular surgery in [facility]. He was able to see that she has previously had vascular evaluation most recently in March 2022. ?Although I do not have the exact numbers it is quoted in their last note it is showing  noncompressible ABIs but normal TBI's and normal waveforms. As such ?there should not be any difficulty with sufficient blood flow to heal this wound. ?2/15; 2 small areas which were initially traumatic but then complicated by infection. We have been using Iodoflex. Both wounds had a very necrotic surface ?last week. ?04/20/2021: 2 small areas that were initially traumatic and then complicated by infection. Iodoflex has been used with clear improvement. The wounds continue ?to contract and are smaller again today without any necrosis, but with adherent slough. ?04/27/2021: The smaller, more distal area is almost closed. The Iodoflex is drying out too much and is pulling away tissue when it is removed. There is a small ?amount of slough present in the larger wound. There is minimal drainage. ?05/05/2021: Last week, due to the Iodoflex causing some local trauma when it was removed, which switched to Prisma. There has been no significant change in ?her wounds since that time. They basically look the same as they did last week. Minimal drainage. Small amount of slough in the larger wound. ?05/11/2021: The more distal wound is closed. The larger wound has contracted significantly with good epithelialization. The wound bed is much cleaner. ?Unfortunately, her 3 layer compression wrap was a bit too tight last week and she had to come into the clinic on Friday to have it redone. She has not ?experienced any further issues since the rewrap. ?Electronic Signature(s) ?Signed: 05/11/2021 5:17:12 PM By: Fredirick Maudlin MD FACS ?Entered By: Fredirick Maudlin on 05/11/2021 17:17:12 ?-------------------------------------------------------------------------------- ?Physical Exam  Details ?Patient Name: Date of Service: ?Carla Stokes, [name] NDA C. 05/11/2021 10:00 A M ?Medical Record Number: [id number] ?Patient Account Number: [id number] ?Date of Birth/Sex: Treating RN: ?04-Oct-1950 (71 y.o. F) ?Primary Care Provider: Teressa Lower Other Clinician: ?Referring Provider: ?Treating Provider/Extender: Fredirick Maudlin ?Teressa Lower ?Weeks in Treatment: 4 ?Constitutional ?She is hypertensive, but asymptomatic.. . . No acute distress. ?Respiratory ?Normal work of breathing on room air. ?Notes ?05/11/2021: The caudal lesion has closed. The more cranial larger lesion remains open, but has contracted significantly. There was minimal slough in the wound. ?Electronic Signature(s) ?Signed: 05/11/2021 5:18:30 PM By: Fredirick Maudlin MD FACS ?Entered By: Fredirick Maudlin on 05/11/2021 17:18:29 ?-------------------------------------------------------------------------------- ?Physician Orders Details ?Patient Name: ?Date of Service: ?MA [place], [name] NDA C. 05/11/2021 10:00 A M ?Medical Record Number: [id number] ?Patient Account Number: [id number] ?Date of Birth/Sex: ?Treating RN: ?1950/12/12 (71 y.o. Sue Lush ?Primary Care Provider: Teressa Lower ?Other Clinician: ?Referring Provider: ?Treating Provider/Extender: Fredirick Maudlin ?Teressa Lower ?Weeks in Treatment: 4 ?Verbal / Phone Orders: No ?Diagnosis Coding ?ICD-10 Coding ?Code Description ?[id number] Abrasion, left lower leg, subsequent encounter ?[id number] Non-pressure chronic ulcer of other part of left lower leg with other specified severity ?V070573 Chronic venous hypertension (idiopathic) with ulcer and inflammation of left lower extremity ?Follow-up Appointments ?ppointment in 1 week. - Dr. Celine Ahr (Room 1) ?Return A ?Bathing/ Shower/ Hygiene ?May shower with protection but do not get wound dressing(s) wet. ?Edema Control - Lymphedema / SCD / Other ?Left Lower Extremity ?Elevate legs to the level of the heart or above for 30 minutes daily and/or when sitting,  a frequency of: - 3-4 times a day throughout the day. ?Avoid standing for long periods of time. ?Exercise regularly ?Moisturize legs daily. - right leg every night before bed. ?Compression stocking or G

## 2021-05-18 ENCOUNTER — Other Ambulatory Visit: Payer: Self-pay

## 2021-05-18 ENCOUNTER — Encounter (HOSPITAL_BASED_OUTPATIENT_CLINIC_OR_DEPARTMENT_OTHER): Payer: Medicare HMO | Admitting: General Surgery

## 2021-05-18 DIAGNOSIS — S80812A Abrasion, left lower leg, initial encounter: Secondary | ICD-10-CM | POA: Diagnosis not present

## 2021-05-18 NOTE — Progress Notes (Signed)
SAYDIE, GERDTS ([id number]) ?Visit Report for 05/18/2021 ?Arrival Information Details ?Patient Name: Date of Service: ?[place], [name]. 05/18/2021 10:15 A M ?Medical Record Number: [id number] ?Patient Account Number: [id number] ?Date of Birth/Sex: Treating RN: ?25-Nov-1950 (71 y.o. F) ?Primary Care Kalei Mckillop: Dina Rich Other Clinician: ?Referring Chancey Ringel: ?Treating Dishon Kehoe/Extender: Duanne Guess ?Dina Rich ?Weeks in Treatment: 5 ?Visit Information History Since Last Visit ?Added or deleted any medications: No ?Patient Arrived: Ambulatory ?Any new allergies or adverse reactions: No ?Arrival Time: 10:12 ?Had a fall or experienced change in No ?Accompanied By: self ?activities of daily living that may affect ?Transfer Assistance: None ?risk of falls: ?Patient Identification Verified: Yes ?Signs or symptoms of abuse/neglect since last visito No ?Secondary Verification Process Completed: Yes ?Hospitalized since last visit: No ?Patient Requires Transmission-Based Precautions: No ?Implantable device outside of the clinic excluding No ?Patient Has Alerts: Yes ?cellular tissue based products placed in the center ?Patient Alerts: Bil ABIs N/C, TBIs WNL since last visit: ?Has Dressing in Place as Prescribed: Yes ?Pain Present Now: No ?Electronic Signature(s) ?Signed: 05/18/2021 3:06:47 PM By: Karl Ito ?Entered By: Karl Ito on 05/18/2021 10:12:28 ?-------------------------------------------------------------------------------- ?Clinic Level of Care Assessment Details ?Patient Name: Date of Service: ?[place], [name]. 05/18/2021 10:15 A M ?Medical Record Number: [id number] ?Patient Account Number: [id number] ?Date of Birth/Sex: Treating RN: ?1950-03-13 (71 y.o. [name], [name] ?Primary Care Jazlynne Milliner: Dina Rich Other Clinician: ?Referring Jalise Zawistowski: ?Treating Day Deery/Extender: Duanne Guess ?Dina Rich ?Weeks in Treatment: 5 ?Clinic Level of Care Assessment Items ?TOOL 4 Quantity Score ?X-  1 0 ?Use when only an EandM is performed on FOLLOW-UP visit ?ASSESSMENTS - Nursing Assessment / Reassessment ?X- 1 10 ?Reassessment of Co-morbidities (includes updates in patient status) ?X- 1 5 ?Reassessment of Adherence to Treatment Plan ?ASSESSMENTS - Wound and Skin A ssessment / Reassessment ?X - Simple Wound Assessment / Reassessment - one wound 1 5 ?[]  - 0 ?Complex Wound Assessment / Reassessment - multiple wounds ?[]  - 0 ?Dermatologic / Skin Assessment (not related to wound area) ?ASSESSMENTS - Focused Assessment ?[]  - 0 ?Circumferential Edema Measurements - multi extremities ?[]  - 0 ?Nutritional Assessment / Counseling / Intervention ?X- 1 5 ?Lower Extremity Assessment (monofilament, tuning fork, pulses) ?[]  - 0 ?Peripheral Arterial Disease Assessment (using hand held doppler) ?ASSESSMENTS - Ostomy and/or Continence Assessment and Care ?[]  - 0 ?Incontinence Assessment and Management ?[]  - 0 ?Ostomy Care Assessment and Management (repouching, etc.) ?PROCESS - Coordination of Care ?X - Simple Patient / Family Education for ongoing care 1 15 ?[]  - 0 ?Complex (extensive) Patient / Family Education for ongoing care ?X- 1 10 ?Staff obtains Consents, Records, T Results / Process Orders ?est ?[]  - 0 ?Staff telephones HHA, Nursing Homes / Clarify orders / etc ?[]  - 0 ?Routine Transfer to another Facility (non-emergent condition) ?[]  - 0 ?Routine Hospital Admission (non-emergent condition) ?[]  - 0 ?New Admissions / / Ordering NPWT Apligraf, etc. ?, ?[]  - 0 ?Emergency Hospital Admission (emergent condition) ?X- 1 10 ?Simple Discharge Coordination ?[]  - 0 ?Complex (extensive) Discharge Coordination ?PROCESS - Special Needs ?[]  - 0 ?Pediatric / Minor Patient Management ?[]  - 0 ?Isolation Patient Management ?[]  - 0 ?Hearing / Language / Visual special needs ?[]  - 0 ?Assessment of Community assistance (transportation, D/C planning, etc.) ?[]  - 0 ?Additional assistance / Altered mentation ?[]  -  0 ?Support Surface(s) Assessment (bed, cushion, seat, etc.) ?INTERVENTIONS - Wound Cleansing / Measurement ?X - Simple Wound Cleansing - one wound 1 5 ?[]  -  0 ?Complex Wound Cleansing - multiple wounds ?X- 1 5 ?Wound Imaging (photographs - any number of wounds) ?[]  - 0 ?Wound Tracing (instead of photographs) ?X- 1 5 ?Simple Wound Measurement - one wound ?[]  - 0 ?Complex Wound Measurement - multiple wounds ?INTERVENTIONS - Wound Dressings ?[]  - 0 ?Small Wound Dressing one or multiple wounds ?[]  - 0 ?Medium Wound Dressing one or multiple wounds ?[]  - 0 ?Large Wound Dressing one or multiple wounds ?[]  - 0 ?Application of Medications - topical ?[]  - 0 ?Application of Medications - injection ?INTERVENTIONS - Miscellaneous ?[]  - 0 ?External ear exam ?[]  - 0 ?Specimen Collection (cultures, biopsies, blood, body fluids, etc.) ?[]  - 0 ?Specimen(s) / Culture(s) sent or taken to Lab for analysis ?[]  - 0 ?Patient Transfer (multiple staff / / Similar devices) ?[]  - 0 ?Simple Staple / Suture removal (25 or less) ?[]  - 0 ?Complex Staple / Suture removal (26 or more) ?[]  - 0 ?Hypo / Hyperglycemic Management (close monitor of Blood Glucose) ?[]  - 0 ?Ankle / Brachial Index (ABI) - do not check if billed separately ?X- 1 5 ?Vital Signs ?Has the patient been seen at the hospital within the last three years: Yes ?Total Score: 80 ?Level Of Care: New/Established - Level 3 ?Electronic Signature(s) ?Signed: 05/18/2021 5:54:13 PM By: RN, BSN ?Entered By: on 05/18/2021 12:06:51 ?-------------------------------------------------------------------------------- ?Encounter Discharge Information Details ?Patient Name: Date of Service: ? , NDA C. 05/18/2021 10:15 A M ?Medical Record Number: ?Patient Account Number: ?Date of Birth/Sex: Treating RN: ?11/02/1950 (71 y.o. , [name] ?Primary Care Tellis Spivak: Other Clinician: ?Referring Jatavion Peaster: ?Treating  Geraline Halberstadt/Extender: ? ?Weeks in Treatment: 5 ?Encounter Discharge Information Items ?Discharge Condition: Stable ?Ambulatory Status: Ambulatory ?Discharge Destination: Home ?Transportation: Private Auto ?Accompanied By: alone ?Schedule Follow-up Appointment: Yes ?Clinical Summary of Care: Patient Declined ?Electronic Signature(s) ?Signed: 05/18/2021 5:54:13 PM By: Zandra Abts RN, BSN ?Entered By: Zandra Abts on 05/18/2021 12:07:24 ?-------------------------------------------------------------------------------- ?Lower Extremity Assessment Details ?Patient Name: Date of Service: ?[place], [name]. 05/18/2021 10:15 A M ?Medical Record Number: [id number] ?Patient Account Number: [id number] ?Date of Birth/Sex: Treating RN: ?Nov 11, 1950 (71 y.o. [name], [name] ?Primary Care Olivene Cookston: Dina Rich Other Clinician: ?Referring Nicholai Willette: ?Treating Britne Borelli/Extender: Duanne Guess ?Dina Rich ?Weeks in Treatment: 5 ?Edema Assessment ?Assessed: [Left: No] [Right: No] ?Edema: [Left: Ye] [Right: s] ?Calf ?Left: Right: ?Point of Measurement: From Medial Instep 35 cm ?Ankle ?Left: Right: ?Point of Measurement: From Medial Instep 22 cm ?Vascular Assessment ?Pulses: ?Dorsalis Pedis ?Palpable: [Left:Yes] ?Electronic Signature(s) ?Signed: 05/18/2021 5:54:13 PM By: Zandra Abts RN, BSN ?Entered By: Zandra Abts on 05/18/2021 10:36:26 ?-------------------------------------------------------------------------------- ?Multi Wound Chart Details ?Patient Name: ?Date of Service: ?[place], [name]. 05/18/2021 10:15 A M ?Medical Record Number: [id number] ?Patient Account Number: [id number] ?Date of Birth/Sex: ?Treating RN: ?[birth year] (71 y.o. F) ?Primary Care Aashritha Miedema: [name] ?Other Clinician: ?Referring Syon Tews: ?Treating Soua Lenk/Extender: Dina Rich ?Duanne Guess ?Weeks in Treatment: 5 ?Vital Signs ?Height(in): 67 ?Capillary Blood Glucose(mg/dl): Dina Rich ?Weight(lbs): ?Pulse(bpm):  62 ?Body Mass Index(BMI): ?Blood Pressure(mmHg): 141/69 ?Temperature(??F): 98.2 ?Respiratory Rate(breaths/min): 18 ?Photos: [N/A:N/A] ?Left, Lateral Lower Leg N/A N/A ?Wound Location: ?Trauma N/A N/A ?Wounding Event: ?

## 2021-05-18 NOTE — Progress Notes (Signed)
SHERIDYN, SATKOWSKI ([id number]) ?Visit Report for 05/18/2021 ?Chief Complaint Document Details ?Patient Name: Date of Service: ?[place], [name] NDA C. 05/18/2021 10:15 A M ?Medical Record Number: [id number] ?Patient Account Number: [id number] ?Date of Birth/Sex: Treating RN: ?[birth year] (71 y.o. F) ?Primary Care Provider: Teressa Lower Other Clinician: ?Referring Provider: ?Treating Provider/Extender: Fredirick Maudlin ?Teressa Lower ?Weeks in Treatment: 5 ?Information Obtained from: Patient ?Chief Complaint ?05/05/2021; patient is here for review of wound on the left lateral lower leg ?Electronic Signature(s) ?Signed: 05/18/2021 10:46:31 AM By: Fredirick Maudlin MD FACS ?Entered By: Fredirick Maudlin on 05/18/2021 10:46:31 ?-------------------------------------------------------------------------------- ?HPI Details ?Patient Name: Date of Service: ?[place], [name] NDA C. 05/18/2021 10:15 A M ?Medical Record Number: [id number] ?Patient Account Number: [id number] ?Date of Birth/Sex: Treating RN: ?[birth year] (71 y.o. F) ?Primary Care Provider: Teressa Lower Other Clinician: ?Referring Provider: ?Treating Provider/Extender: Fredirick Maudlin ?Teressa Lower ?Weeks in Treatment: 5 ?History of Present Illness ?HPI Description: ADMISSION ?04/07/2021 ?This is a 71 year old woman with type 2 diabetes. She states her problem began towards the end of November when she had a fall winter in the yard and ?injured her left lower leg. She has been followed by [facility] with [facility] [address] Saw them on 03/02/2021 she was felt to have cellulitis of the left ankle and ?the left leg wound. She ultimately was felt to have a venous stasis ulcer and I believe was put in an [employer]. She is not wearing that currently. She had ?several further visits. The wound became increasingly painful and on 04/01/2021 she went to the emergency room. An x-ray of the area showed vascular ?calcification but no bony abnormality. She was put on doxycycline which she is still  taking. She does not have a history of prior wounds or venous ?insufficiency that she knows about. ?Past medical history includes type 2 diabetes with a A1c of 7.5 in October, hypertension, gout, hypercholesterolemia, diabetic foot ulcer and a history of PAD ?The patient follows with vascular surgery in [facility]. He was able to see that she has previously had vascular evaluation most recently in March 2022. ?Although I do not have the exact numbers it is quoted in their last note it is showing noncompressible ABIs but normal TBI's and normal waveforms. As such ?there should not be any difficulty with sufficient blood flow to heal this wound. ?2/15; 2 small areas which were initially traumatic but then complicated by infection. We have been using Iodoflex. Both wounds had a very necrotic surface ?last week. ?04/20/2021: 2 small areas that were initially traumatic and then complicated by infection. Iodoflex has been used with clear improvement. The wounds continue ?to contract and are smaller again today without any necrosis, but with adherent slough. ?04/27/2021: The smaller, more distal area is almost closed. The Iodoflex is drying out too much and is pulling away tissue when it is removed. There is a small ?amount of slough present in the larger wound. There is minimal drainage. ?05/05/2021: Last week, due to the Iodoflex causing some local trauma when it was removed, which switched to Prisma. There has been no significant change in ?her wounds since that time. They basically look the same as they did last week. Minimal drainage. Small amount of slough in the larger wound. ?05/11/2021: The more distal wound is closed. The larger wound has contracted significantly with good epithelialization. The wound bed is much cleaner. ?Unfortunately, her 3 layer compression wrap was a bit too tight last week and she had to come into the clinic  on Friday to have it redone. She has not ?experienced any further issues since the  rewrap. ?05/18/2021: Her wound has healed. ?Electronic Signature(s) ?Signed: 05/18/2021 10:46:47 AM By: Fredirick Maudlin MD FACS ?Entered By: Fredirick Maudlin on 05/18/2021 10:46:47 ?-------------------------------------------------------------------------------- ?Physical Exam Details ?Patient Name: Date of Service: ?[place], [name] NDA C. 05/18/2021 10:15 A M ?Medical Record Number: [id number] ?Patient Account Number: [id number] ?Date of Birth/Sex: Treating RN: ?[birth year] (71 y.o. F) ?Primary Care Provider: Teressa Lower Other Clinician: ?Referring Provider: ?Treating Provider/Extender: Fredirick Maudlin ?Teressa Lower ?Weeks in Treatment: 5 ?Constitutional ?. . . . No acute distress. ?Respiratory ?Normal work of breathing on room air. ?Notes ?05/18/2021: Her wound has healed. ?Electronic Signature(s) ?Signed: 05/18/2021 10:47:24 AM By: Fredirick Maudlin MD FACS ?Entered By: Fredirick Maudlin on 05/18/2021 10:47:23 ?-------------------------------------------------------------------------------- ?Physician Orders Details ?Patient Name: Date of Service: ?[place], [name] NDA C. 05/18/2021 10:15 A M ?Medical Record Number: [id number] ?Patient Account Number: [id number] ?Date of Birth/Sex: Treating RN: ?1950-07-28 (71 y.o. 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 ?Edema Control - Lymphedema / SCD / Other ?Left Lower Extremity ?Elevate legs to the level of the heart or above for  30 minutes daily and/or when sitting, a frequency of: - 3-4 times a day throughout the day. ?Avoid standing for long periods of time. ?Exercise regularly ?Moisturize legs daily. ?Compression stocking or Garment 20-30 mm/Hg pressure to: - both legs ?Electronic Signature(s) ?Signed: 05/18/2021 10:47:36 AM By: Fredirick Maudlin MD FACS ?Entered By: Fredirick Maudlin on 05/18/2021 10:47:36 ?-------------------------------------------------------------------------------- ?Problem List Details ?Patient Name: Date of Service: ?[place], [name] NDA C. 05/18/2021 10:15 A M ?Medical Record Number: [id number] ?Patient Account Number: [id number] ?Date of Birth/Sex: Treating RN: ?06-Jun-1950 (71 y.o. Benjamine Sprague, [name] ?Primary Care Provider: Teressa Lower Other Clinician: ?Referring Provider: ?Treating Provider/Extender: Fredirick Maudlin ?Teressa Lower ?Weeks in Treatment: 5 ?Active Problems ?ICD-10 ?Encounter ?Code Description Active Date MDM ?Diagnosis ?[id number] Abrasion, left lower leg, subsequent encounter 04/07/2021 No Yes ?[id number] Non-pressure chronic ulcer of other part of left lower leg with other specified 04/07/2021 No Yes ?severity ?[id number] Chronic venous hypertension (idiopathic) with ulcer and inflammation of left 04/07/2021 No Yes ?lower extremity ?Inactive Problems ?Resolved Problems ?Electronic Signature(s) ?Signed: 05/18/2021 10:46:17 AM By: Fredirick Maudlin MD FACS ?Entered By: Fredirick Maudlin on 05/18/2021 10:46:17 ?-------------------------------------------------------------------------------- ?Progress Note Details ?Patient Name: Date of Service: ?[place], [name] NDA C. 05/18/2021 10:15 A M ?Medical Record Number: [id number] ?Patient Account Number: [id number] ?Date of Birth/Sex: Treating RN: ?12-Oct-1950 (71 y.o. F) ?Primary Care Provider: Teressa Lower Other Clinician: ?Referring Provider: ?Treating Provider/Extender: Fredirick Maudlin ?Teressa Lower ?Weeks in Treatment: 5 ?Subjective ?Chief Complaint ?Information obtained from  Patient ?05/05/2021; patient is here for review of wound on the left lateral lower leg ?History of Present Illness (HPI) ?ADMISSION ?04/07/2021 ?This is a 71 year old woman with type 2 diabetes. She states her p

## 2021-05-21 ENCOUNTER — Encounter (HOSPITAL_BASED_OUTPATIENT_CLINIC_OR_DEPARTMENT_OTHER): Payer: Medicare HMO | Admitting: General Surgery

## 2021-05-21 ENCOUNTER — Other Ambulatory Visit: Payer: Self-pay

## 2021-05-21 DIAGNOSIS — S80812A Abrasion, left lower leg, initial encounter: Secondary | ICD-10-CM | POA: Diagnosis not present

## 2021-05-24 NOTE — Progress Notes (Signed)
Carla Stokes, Carla Stokes ([id number]) ?Visit Report for 05/21/2021 ?Chief Complaint Document Details ?Patient Name: Date of Service: ?[place], [name]. 05/21/2021 2:45 PM ?Medical Record Number: [id number] ?Patient Account Number: [id number] ?Date of Birth/Sex: Treating RN: ?01/17/1951 (71 y.o. Carla Stokes, Carla Stokes ?Primary Care Provider: Dina Stokes Other Clinician: ?Referring Provider: ?Treating Provider/Extender: Duanne Guess ?Carla Stokes ?Weeks in Treatment: 6 ?Information Obtained from: Patient ?Chief Complaint ?05/05/2021; patient is here for review of wound on the left lateral lower leg ?Electronic Signature(s) ?Signed: 05/21/2021 3:44:48 PM By: Duanne Guess MD FACS ?Entered By: Duanne Guess on 05/21/2021 15:44:47 ?-------------------------------------------------------------------------------- ?HPI Details ?Patient Name: Date of Service: ?[place], [name]. 05/21/2021 2:45 PM ?Medical Record Number: [id number] ?Patient Account Number: [id number] ?Date of Birth/Sex: Treating RN: ?[birth year] (71 y.o. Carla Stokes, Carla Stokes ?Primary Care Provider: Dina Stokes Other Clinician: ?Referring Provider: ?Treating Provider/Extender: Duanne Guess ?Carla Stokes ?Weeks in Treatment: 6 ?History of Present Illness ?HPI Description: ADMISSION ?04/07/2021 ?This is a 71 year old woman with type 2 diabetes. She states her problem began towards the end of November when she had a fall winter in the yard and ?injured her left lower leg. She has been followed by podiatry with [facility]. Saw them on 03/02/2021 she was felt to have cellulitis of the left ankle and ?the left leg wound. She ultimately was felt to have a venous stasis ulcer and I believe was put in an [employer]. She is not wearing that currently. She had ?several further visits. The wound became increasingly painful and on 04/01/2021 she went to the emergency room. An x-ray of the area showed vascular ?calcification but no bony abnormality. She was put on  doxycycline which she is still taking. She does not have a history of prior wounds or venous ?insufficiency that she knows about. ?Past medical history includes type 2 diabetes with a A1c of 7.5 in October, hypertension, gout, hypercholesterolemia, diabetic foot ulcer and a history of PAD ?The patient follows with vascular surgery in [facility]. He was able to see that she has previously had vascular evaluation most recently in March 2022. ?Although I do not have the exact numbers it is quoted in their last note it is showing noncompressible ABIs but normal TBI's and normal waveforms. As such ?there should not be any difficulty with sufficient blood flow to heal this wound. ?2/15; 2 small areas which were initially traumatic but then complicated by infection. We have been using Iodoflex. Both wounds had a very necrotic surface ?last week. ?04/20/2021: 2 small areas that were initially traumatic and then complicated by infection. Iodoflex has been used with clear improvement. The wounds continue ?to contract and are smaller again today without any necrosis, but with adherent slough. ?04/27/2021: The smaller, more distal area is almost closed. The Iodoflex is drying out too much and is pulling away tissue when it is removed. There is a small ?amount of slough present in the larger wound. There is minimal drainage. ?05/05/2021: Last week, due to the Iodoflex causing some local trauma when it was removed, which switched to Prisma. There has been no significant change in ?her wounds since that time. They basically look the same as they did last week. Minimal drainage. Small amount of slough in the larger wound. ?05/11/2021: The more distal wound is closed. The larger wound has contracted significantly with good epithelialization. The wound bed is much cleaner. ?Unfortunately, her 3 layer compression wrap was a bit too tight last week and she had to come into  the clinic on Friday to have it redone. She has not ?experienced any  further issues since the rewrap. ?05/18/2021: Her wound has healed. ?05/21/2021: We healed her wound out on Tuesday. On Wednesday evening, the patient reports that she was getting undressed and noticed some pain at the ?wound site. There was a blister there that subsequently broke and began draining. She now has reopened this site. It is fairly superficial. No concern for ?infection. ?Electronic Signature(s) ?Signed: 05/21/2021 3:45:57 PM By: Duanne Guess MD FACS ?Entered By: Duanne Guess on 05/21/2021 15:45:56 ?-------------------------------------------------------------------------------- ?Physical Exam Details ?Patient Name: Date of Service: ?[place], [name]. 05/21/2021 2:45 PM ?Medical Record Number: [id number] ?Patient Account Number: [id number] ?Date of Birth/Sex: Treating RN: ?02-Aug-1950 (71 y.o. Carla Stokes, Carla Stokes ?Primary Care Provider: Dina Stokes Other Clinician: ?Referring Provider: ?Treating Provider/Extender: Duanne Guess ?Carla Stokes ?Weeks in Treatment: 6 ?Constitutional ?. . . . No acute distress. ?Respiratory ?Normal work of breathing on room air. ?Notes ?05/21/2021: On the lateral aspect of her left calf, there is an open area that appears to be just partial-thickness in the same site that her previous wound had ?been. There is no erythema, induration, or other suggestion of infection. There is some overlying loose dead skin, consistent with a history of it having been a ?blister that opened. ?Electronic Signature(s) ?Signed: 05/21/2021 3:47:23 PM By: Duanne Guess MD FACS ?Entered By: Duanne Guess on 05/21/2021 15:47:22 ?-------------------------------------------------------------------------------- ?Physician Orders Details ?Patient Name: Date of Service: ?[place], [name]. 05/21/2021 2:45 PM ?Medical Record Number: [id number] ?Patient Account Number: [id number] ?Date of Birth/Sex: Treating RN: ?March 31, 1950 (71 y.o. Carla Stokes, Carla Stokes ?Primary Care Provider: Dina Stokes Other  Clinician: ?Referring Provider: ?Treating Provider/Extender: Duanne Guess ?Carla Stokes ?Weeks in Treatment: 6 ?Verbal / Phone Orders: No ?Diagnosis Coding ?ICD-10 Coding ?Code Description ?X48.016P Abrasion, left lower leg, subsequent encounter ?V37.482 Non-pressure chronic ulcer of other part of left lower leg with other specified severity ?L07.867 Chronic venous hypertension (idiopathic) with ulcer and inflammation of left lower extremity ?Follow-up Appointments ?ppointment in 1 week. - Dr. Lady Gary - Room 2 ?Return A ?Bathing/ Shower/ Hygiene ?May shower with protection but do not get wound dressing(s) wet. ?Edema Control - Lymphedema / SCD / Other ?Left Lower Extremity ?Elevate legs to the level of the heart or above for 30 minutes daily and/or when sitting, a frequency of: - 3-4 times a day throughout the day. ?Avoid standing for long periods of time. ?Exercise regularly ?Moisturize legs daily. - right leg every night before bed. ?Compression stocking or Garment 20-30 mm/Hg pressure to: - right leg daily ?Wound Treatment ?Wound #1R - Lower Leg Wound Laterality: Left, Lateral ?Cleanser: Soap and Water 1 x Per Week/15 Days ?Discharge Instructions: May shower and wash wound with dial antibacterial soap and water prior to dressing change. ?Cleanser: Wound Cleanser 1 x Per Week/15 Days ?Discharge Instructions: Cleanse the wound with wound cleanser prior to applying a clean dressing using gauze sponges, not tissue or cotton balls. ?Peri-Wound Care: Sween Lotion (Moisturizing lotion) 1 x Per Week/15 Days ?Discharge Instructions: Apply moisturizing lotion as directed ?Prim Dressing: Promogran Prisma Matrix, 4.34 (sq in) (silver collagen) 1 x Per Week/15 Days ?ary ?Discharge Instructions: Moisten collagen with saline or hydrogel ?Secondary Dressing: Woven Gauze Sponge, Non-Sterile 4x4 in 1 x Per Week/15 Days ?Discharge Instructions: Apply over primary dressing as directed. ?Compression Wrap: ThreePress (3 layer  compression wrap) 1 x Per Week/15 Days ?Discharge Instructions: Apply three layer compression as directed. ?Electronic Signature(s) ?Signed: 05/21/2021 3:47:40 PM  By: Duanne Guessannon, Dequavion Follette MD FACS ?Entered By: Lady Garyannon,

## 2021-05-24 NOTE — Progress Notes (Signed)
OLA, RAAP ([id number]) ?Visit Report for 05/21/2021 ?Arrival Information Details ?Patient Name: Date of Service: ?[place], [name] NDA C. 05/21/2021 2:45 PM ?Medical Record Number: [id number] ?Patient Account Number: [id number] ?Date of Birth/Sex: Treating RN: ?1950/09/17 (71 y.o. Carla Stokes, Carla Stokes ?Primary Care Vaidehi Braddy: Teressa Lower Other Clinician: ?Referring Jarrah Seher: ?Treating Siria Calandro/Extender: Fredirick Maudlin ?Teressa Lower ?Weeks in Treatment: 6 ?Visit Information History Since Last Visit ?Added or deleted any medications: No ?Patient Arrived: Ambulatory ?Any new allergies or adverse reactions: No ?Arrival Time: 14:53 ?Had a fall or experienced change in No ?Accompanied By: self ?activities of daily living that may affect ?Transfer Assistance: None ?risk of falls: ?Patient Identification Verified: Yes ?Signs or symptoms of abuse/neglect since last visito No ?Secondary Verification Process Completed: Yes ?Hospitalized since last visit: No ?Patient Requires Transmission-Based Precautions: No ?Implantable device outside of the clinic excluding No ?Patient Has Alerts: Yes ?cellular tissue based products placed in the center ?Patient Alerts: Bil ABIs N/C, TBIs WNL since last visit: ?Has Dressing in Place as Prescribed: Yes ?Pain Present Now: No ?Electronic Signature(s) ?Signed: 05/24/2021 2:21:40 PM By: Sandre Kitty ?Entered By: Sandre Kitty on 05/21/2021 14:53:30 ?-------------------------------------------------------------------------------- ?Compression Therapy Details ?Patient Name: Date of Service: ?, [name] NDA C. 05/21/2021 2:45 PM ?Medical Record Number: [id number] ?Patient Account Number: [id number] ?Date of Birth/Sex: Treating RN: ?12/25/1950 (71 y.o. Carla Stokes, Carla Stokes ?Primary Care Grisel Blumenstock: Teressa Lower Other Clinician: ?Referring Chantilly Linskey: ?Treating Denson Niccoli/Extender: Fredirick Maudlin ?Teressa Lower ?Weeks in Treatment: 6 ?Compression Therapy Performed for Wound Assessment: Wound #1R  Left,Lateral Lower Leg ?Performed By: Clinician Levan Hurst, RN ?Compression Type: Three Layer ?Post Procedure Diagnosis ?Same as Pre-procedure ?Electronic Signature(s) ?Signed: 05/21/2021 4:52:25 PM By: Levan Hurst RN, BSN ?Entered By: Levan Hurst on 05/21/2021 15:36:44 ?-------------------------------------------------------------------------------- ?Encounter Discharge Information Details ?Patient Name: ?Date of Service: ?[place] Carla [place], [name] NDA C. 05/21/2021 2:45 PM ?Medical Record Number: [id number] ?Patient Account Number: [id number] ?Date of Birth/Sex: ?Treating RN: ?06/13/1950 (71 y.o. Carla Stokes, Carla Stokes ?Primary Care Prue Lingenfelter: Teressa Lower ?Other Clinician: ?Referring Izumi Mixon: ?Treating Merisa Julio/Extender: Fredirick Maudlin ?Teressa Lower ?Weeks in Treatment: 6 ?Encounter Discharge Information Items ?Discharge Condition: Stable ?Ambulatory Status: Ambulatory ?Discharge Destination: Home ?Transportation: Private Auto ?Accompanied By: alone ?Schedule Follow-up Appointment: Yes ?Clinical Summary of Care: Patient Declined ?Electronic Signature(s) ?Signed: 05/21/2021 4:52:25 PM By: Levan Hurst RN, BSN ?Entered By: Levan Hurst on 05/21/2021 16:37:28 ?-------------------------------------------------------------------------------- ?Lower Extremity Assessment Details ?Patient Name: ?Date of Service: ?[place] Carla Stokes, [name] NDA C. 05/21/2021 2:45 PM ?Medical Record Number: [id number] ?Patient Account Number: [id number] ?Date of Birth/Sex: ?Treating RN: ?[birth year] (71 y.o. Carla Stokes, Carla Stokes ?Primary Care Lateefa Crosby: Teressa Lower ?Other Clinician: ?Referring Eriyana Sweeten: ?Treating Lonita Debes/Extender: Fredirick Maudlin ?Teressa Lower ?Weeks in Treatment: 6 ?Edema Assessment ?Assessed: [Left: No] [Right: No] ?Edema: [Left: Ye] [Right: s] ?Calf ?Left: Right: ?Point of Measurement: From Medial Instep 35 cm ?Ankle ?Left: Right: ?Point of Measurement: From Medial Instep 22 cm ?Vascular Assessment ?Pulses: ?Dorsalis Pedis ?Palpable:  [Left:Yes] ?Electronic Signature(s) ?Signed: 05/21/2021 4:52:25 PM By: Levan Hurst RN, BSN ?Entered By: Levan Hurst on 05/21/2021 15:31:01 ?-------------------------------------------------------------------------------- ?Multi Wound Chart Details ?Patient Name: ?Date of Service: ?[place] Carla Stokes, [name] NDA C. 05/21/2021 2:45 PM ?Medical Record Number: [id number] ?Patient Account Number: [id number] ?Date of Birth/Sex: ?Treating RN: ?January 20, 1951 (71 y.o. Carla Stokes, Carla Stokes ?Primary Care Caven Perine: Teressa Lower ?Other Clinician: ?Referring Alvy Alsop: ?Treating Altamese Deguire/Extender: Fredirick Maudlin ?Teressa Lower ?Weeks in Treatment: 6 ?Vital Signs ?Height(in): 67 ?Capillary Blood Glucose(mg/dl): 136 ?Weight(lbs): ?Pulse(bpm): 78 ?Body Mass Index(BMI): ?Blood Pressure(mmHg): 143/79 ?Temperature(??F): 98.9 ?Respiratory Rate(breaths/min): 18 ?Photos: [N/A:N/A] ?Left, Lateral Lower Leg  N/A N/A ?Wound Location: ?Trauma N/A N/A ?Wounding Event: ?Diabetic Wound/Ulcer of the Lower N/A N/A ?Primary Etiology: ?Extremity ?Venous Leg Ulcer N/A N/A ?Secondary Etiology: ?Cataracts, Hypertension, Type II N/A N/A ?Comorbid History: ?Diabetes, Osteoarthritis, Neuropathy ?01/20/2021 N/A N/A ?Date Acquired: ?6 N/A N/A ?Weeks of Treatment: ?Open N/A N/A ?Wound Status: ?Yes N/A N/A ?Wound Recurrence: ?2.1x1.5x0.1 N/A N/A ?Measurements L x W x D (cm) ?2.474 N/A N/A ?A (cm?) : ?rea ?0.247 N/A N/A ?Volume (cm?) : ?16.00% N/A N/A ?% Reduction in A rea: ?16.30% N/A N/A ?% Reduction in Volume: ?Grade 1 N/A N/A ?Classification: ?None Present N/A N/A ?Exudate A mount: ?Distinct, outline attached N/A N/A ?Wound Margin: ?None Present (0%) N/A N/A ?Granulation A mount: ?None Present (0%) N/A N/A ?Necrotic A mount: ?Fascia: No N/A N/A ?Exposed Structures: ?Fat Layer (Subcutaneous Tissue): No ?Tendon: No ?Muscle: No ?Joint: No ?Bone: No ?Large (67-100%) N/A N/A ?Epithelialization: ?Compression Therapy N/A N/A ?Procedures Performed: ?Treatment Notes ?Electronic  Signature(s) ?Signed: 05/21/2021 3:44:38 PM By: Fredirick Maudlin MD FACS ?Signed: 05/21/2021 4:52:25 PM By: Levan Hurst RN, BSN ?Entered By: Fredirick Maudlin on 05/21/2021 15:44:38 ?-------------------------------------------------------------------------------- ?Multi-Disciplinary Care Plan Details ?Patient Name: ?Date of Service: ?[place] Carla Stokes, [name] NDA C. 05/21/2021 2:45 PM ?Medical Record Number: [id number] ?Patient Account Number: [id number] ?Date of Birth/Sex: ?Treating RN: ?[birth year] (71 y.o. Carla Stokes, Carla Stokes ?Primary Care Jermell Holeman: Teressa Lower ?Other Clinician: ?Referring Charmain Diosdado: ?Treating Aydeen Blume/Extender: Fredirick Maudlin ?Teressa Lower ?Weeks in Treatment: 6 ?Multidisciplinary Care Plan reviewed with physician ?Active Inactive ?Wound/Skin Impairment ?Nursing Diagnoses: ?Impaired tissue integrity ?Knowledge deficit related to ulceration/compromised skin integrity ?Goals: ?Patient/caregiver will verbalize understanding of skin care regimen ?Date Initiated: 04/07/2021 ?Target Resolution Date: 06/18/2021 ?Goal Status: Active ?Ulcer/skin breakdown will have a volume reduction of 30% by week 4 ?Date Initiated: 04/07/2021 ?Date Inactivated: 05/05/2021 ?Target Resolution Date: 05/05/2021 ?Goal Status: Met ?Interventions: ?Assess patient/caregiver ability to obtain necessary supplies ?Assess patient/caregiver ability to perform ulcer/skin care regimen upon admission and as needed ?Assess ulceration(s) every visit ?Provide education on ulcer and skin care ?Notes: ?Electronic Signature(s) ?Signed: 05/21/2021 4:52:25 PM By: Levan Hurst RN, BSN ?Entered By: Levan Hurst on 05/21/2021 16:34:30 ?-------------------------------------------------------------------------------- ?Pain Assessment Details ?Patient Name: ?Date of Service: ?[place] Carla Stokes, [name] NDA C. 05/21/2021 2:45 PM ?Medical Record Number: [id number] ?Patient Account Number: [id number] ?Date of Birth/Sex: ?Treating RN: ?21-Oct-1950 (71 y.o. Carla Stokes, Carla Stokes ?Primary Care  Lyrique Hakim: Teressa Lower ?Other Clinician: ?Referring Wilmetta Speiser: ?Treating Azaylah Stailey/Extender: Fredirick Maudlin ?Teressa Lower ?Weeks in Treatment: 6 ?Active Problems ?Location of Pain Severity and Description of Pain ?

## 2021-05-26 ENCOUNTER — Encounter (HOSPITAL_BASED_OUTPATIENT_CLINIC_OR_DEPARTMENT_OTHER): Payer: Medicare HMO | Admitting: General Surgery

## 2021-05-26 ENCOUNTER — Other Ambulatory Visit: Payer: Self-pay

## 2021-05-26 DIAGNOSIS — S80812A Abrasion, left lower leg, initial encounter: Secondary | ICD-10-CM | POA: Diagnosis not present

## 2021-05-26 NOTE — Progress Notes (Signed)
Carla Stokes, Carla Stokes ([id number]) ?Visit Report for 05/26/2021 ?Arrival Information Details ?Patient Name: Date of Service: ?[place], [name] NDA C. 05/26/2021 10:15 A M ?Medical Record Number: [id number] ?Patient Account Number: [id number] ?Date of Birth/Sex: Treating RN: ?January 27, 1951 (71 y.o. F) Stokes, Carla Claude ?Primary Care Danialle Dement: Teressa Lower Other Clinician: ?Referring Kailene Steinhart: ?Treating Dorean Hiebert/Extender: Fredirick Maudlin ?Teressa Lower ?Weeks in Treatment: 7 ?Visit Information History Since Last Visit ?Added or deleted any medications: No ?Patient Arrived: Ambulatory ?Any new allergies or adverse reactions: No ?Arrival Time: 10:20 ?Had a fall or experienced change in No ?Accompanied By: sister ?activities of daily living that may affect ?Transfer Assistance: None ?risk of falls: ?Patient Identification Verified: Yes ?Signs or symptoms of abuse/neglect since last visito No ?Patient Requires Transmission-Based Precautions: No ?Hospitalized since last visit: No ?Patient Has Alerts: Yes ?Implantable device outside of the clinic excluding No ?Patient Alerts: Bil ABIs N/C, TBIs WNL ?cellular tissue based products placed in the center ?since last visit: ?Has Dressing in Place as Prescribed: Yes ?Has Compression in Place as Prescribed: Yes ?Pain Present Now: Yes ?Electronic Signature(s) ?Signed: 05/26/2021 2:36:40 PM By: Dellie Catholic RN ?Entered By: Dellie Catholic on 05/26/2021 10:20:29 ?-------------------------------------------------------------------------------- ?Compression Therapy Details ?Patient Name: Date of Service: ?[place], [name] NDA C. 05/26/2021 10:15 A M ?Medical Record Number: [id number] ?Patient Account Number: [id number] ?Date of Birth/Sex: Treating RN: ?01/13/1951 (71 y.o. F) Stokes, Carla Claude ?Primary Care Tyreque Finken: Teressa Lower Other Clinician: ?Referring Davari Lopes: ?Treating Angelys Yetman/Extender: Fredirick Maudlin ?Teressa Lower ?Weeks in Treatment: 7 ?Compression Therapy Performed for Wound Assessment: Wound  #1R Left,Lateral Lower Leg ?Performed By: Clinician Dellie Catholic, RN ?Compression Type: Three Layer ?Post Procedure Diagnosis ?Same as Pre-procedure ?Electronic Signature(s) ?Signed: 05/26/2021 2:36:40 PM By: Dellie Catholic RN ?Entered By: Dellie Catholic on 05/26/2021 13:00:41 ?-------------------------------------------------------------------------------- ?Encounter Discharge Information Details ?Patient Name: ?Date of Service: ?[name], [name] NDA C. 05/26/2021 10:15 A M ?Medical Record Number: [id number] ?Patient Account Number: [id number] ?Date of Birth/Sex: ?Treating RN: ?Jul 03, 1950 (71 y.o. F) Stokes, Carla Claude ?Primary Care Clarke Peretz: Teressa Lower ?Other Clinician: ?Referring Nylee Barbuto: ?Treating Vinicius Brockman/Extender: Fredirick Maudlin ?Teressa Lower ?Weeks in Treatment: 7 ?Encounter Discharge Information Items Post Procedure Vitals ?Discharge Condition: Stable ?Temperature (F): 98.1 ?Ambulatory Status: Ambulatory ?Pulse (bpm): 58 ?Discharge Destination: Home ?Respiratory Rate (breaths/min): 18 ?Transportation: Private Auto ?Blood Pressure (mmHg): 142/75 ?Accompanied By: sister ?Schedule Follow-up Appointment: Yes ?Clinical Summary of Care: Patient Declined ?Electronic Signature(s) ?Signed: 05/26/2021 2:36:40 PM By: Dellie Catholic RN ?Entered By: Dellie Catholic on 05/26/2021 12:59:59 ?-------------------------------------------------------------------------------- ?Lower Extremity Assessment Details ?Patient Name: ?Date of Service: ?[name], [name] NDA C. 05/26/2021 10:15 A M ?Medical Record Number: [id number] ?Patient Account Number: [id number] ?Date of Birth/Sex: ?Treating RN: ?November 04, 1950 (71 y.o. F) Stokes, Carla Claude ?Primary Care Fiora Weill: Teressa Lower ?Other Clinician: ?Referring Valborg Friar: ?Treating Benjamin Casanas/Extender: Fredirick Maudlin ?Teressa Lower ?Weeks in Treatment: 7 ?Edema Assessment ?Assessed: [Left: No] [Right: No] ?Edema: [Left: Ye] [Right: s] ?Calf ?Left: Right: ?Point of Measurement: From Medial Instep 35  cm ?Ankle ?Left: Right: ?Point of Measurement: From Medial Instep 22 cm ?Electronic Signature(s) ?Signed: 05/26/2021 2:36:40 PM By: Dellie Catholic RN ?Entered By: Dellie Catholic on 05/26/2021 10:25:15 ?-------------------------------------------------------------------------------- ?Multi Wound Chart Details ?Patient Name: ?Date of Service: ?[name], [name] NDA C. 05/26/2021 10:15 A M ?Medical Record Number: [id number] ?Patient Account Number: [id number] ?Date of Birth/Sex: ?Treating RN: ?11-20-1950 (71 y.o. F) ?Primary Care [name]: Teressa Lower ?Other Clinician: ?Referring Verbie Babic: ?Treating Dynasia Kercheval/Extender: Fredirick Maudlin ?Teressa Lower ?Weeks in Treatment: 7 ?Vital Signs ?Height(in): 67 ?Pulse(bpm): 58 ?Weight(lbs): ?Blood Pressure(mmHg): 142/75 ?Body Mass Index(BMI): ?Temperature(??F): 98.1 ?  Respiratory Rate(breaths/min): 18 ?Photos: [N/A:N/A] ?Left, Lateral Lower Leg N/A N/A ?Wound Location: ?Trauma N/A N/A ?Wounding Event: ?Diabetic Wound/Ulcer of the Lower N/A N/A ?Primary Etiology: ?Extremity ?Venous Leg Ulcer N/A N/A ?Secondary Etiology: ?Cataracts, Hypertension, Type II N/A N/A ?Comorbid History: ?Diabetes, Osteoarthritis, Neuropathy ?01/20/2021 N/A N/A ?Date Acquired: ?7 N/A N/A ?Weeks of Treatment: ?Open N/A N/A ?Wound Status: ?Yes N/A N/A ?Wound Recurrence: ?2x1.2x0.1 N/A N/A ?Measurements L x W x D (cm) ?1.885 N/A N/A ?A (cm?) : ?rea ?0.188 N/A N/A ?Volume (cm?) : ?36.00% N/A N/A ?% Reduction in A rea: ?36.30% N/A N/A ?% Reduction in Volume: ?Grade 1 N/A N/A ?Classification: ?Medium N/A N/A ?Exudate A mount: ?Serosanguineous N/A N/A ?Exudate Type: ?red, brown N/A N/A ?Exudate Color: ?Distinct, outline attached N/A N/A ?Wound Margin: ?Large (67-100%) N/A N/A ?Granulation A mount: ?Red, Hyper-granulation N/A N/A ?Granulation Quality: ?Small (1-33%) N/A N/A ?Necrotic A mount: ?Fascia: No N/A N/A ?Exposed Structures: ?Fat Layer (Subcutaneous Tissue): No ?Tendon: No ?Muscle: No ?Joint: No ?Bone:  No ?Small (1-33%) N/A N/A ?Epithelialization: ?Treatment Notes ?Electronic Signature(s) ?Signed: 05/26/2021 10:50:25 AM By: Fredirick Maudlin MD FACS ?Entered By: Fredirick Maudlin on 05/26/2021 10:50:25 ?-------------------------------------------------------------------------------- ?Multi-Disciplinary Care Plan Details ?Patient Name: ?Date of Service: ?MA [place], [name] NDA C. 05/26/2021 10:15 A M ?Medical Record Number: [id number] ?Patient Account Number: [id number] ?Date of Birth/Sex: ?Treating RN: ?[birth year] (71 y.o. F) Stokes, Carla Claude ?Primary Care Britt Petroni: Teressa Lower ?Other Clinician: ?Referring Greysen Devino: ?Treating Nautica Hotz/Extender: Fredirick Maudlin ?Teressa Lower ?Weeks in Treatment: 7 ?Multidisciplinary Care Plan reviewed with physician ?Active Inactive ?Wound/Skin Impairment ?Nursing Diagnoses: ?Impaired tissue integrity ?Knowledge deficit related to ulceration/compromised skin integrity ?Goals: ?Patient/caregiver will verbalize understanding of skin care regimen ?Date Initiated: 04/07/2021 ?Target Resolution Date: 06/18/2021 ?Goal Status: Active ?Ulcer/skin breakdown will have a volume reduction of 30% by week 4 ?Date Initiated: 04/07/2021 ?Date Inactivated: 05/05/2021 ?Target Resolution Date: 05/05/2021 ?Goal Status: Met ?Interventions: ?Assess patient/caregiver ability to obtain necessary supplies ?Assess patient/caregiver ability to perform ulcer/skin care regimen upon admission and as needed ?Assess ulceration(s) every visit ?Provide education on ulcer and skin care ?Notes: ?Electronic Signature(s) ?Signed: 05/26/2021 2:36:40 PM By: Dellie Catholic RN ?Entered By: Dellie Catholic on 05/26/2021 13:01:16 ?-------------------------------------------------------------------------------- ?Pain Assessment Details ?Patient Name: ?Date of Service: ?MA [place], [name] NDA C. 05/26/2021 10:15 A M ?Medical Record Number: [id number] ?Patient Account Number: [id number] ?Date of Birth/Sex: ?Treating RN: ?09/01/1950 (71 y.o. F) Stokes,  Carla Claude ?Primary Care Herbert Marken: Teressa Lower ?Other Clinician: ?Referring Camyah Pultz: ?Treating Donterius Filley/Extender: Fredirick Maudlin ?Teressa Lower ?Weeks in Treatment: 7 ?Active Problems ?Location of Pain Severi

## 2021-05-26 NOTE — Progress Notes (Signed)
Carla, Stokes ([id number]) ?Visit Report for 05/26/2021 ?Chief Complaint Document Details ?Patient Name: Date of Service: ?[place], [name] NDA C. 05/26/2021 10:15 A M ?Medical Record Number: [id number] ?Patient Account Number: [id number] ?Date of Birth/Sex: Treating RN: ?1950/10/08 (71 y.o. F) ?Primary Care Provider: Teressa Lower Other Clinician: ?Referring Provider: ?Treating Provider/Extender: Fredirick Maudlin ?Teressa Lower ?Weeks in Treatment: 7 ?Information Obtained from: Patient ?Chief Complaint ?05/05/2021; patient is here for review of wound on the left lateral lower leg ?Electronic Signature(s) ?Signed: 05/26/2021 10:50:34 AM By: Fredirick Maudlin MD FACS ?Entered By: Fredirick Maudlin on 05/26/2021 10:50:33 ?-------------------------------------------------------------------------------- ?Debridement Details ?Patient Name: Date of Service: ?Carla Stokes, [name] NDA C. 05/26/2021 10:15 A M ?Medical Record Number: [id number] ?Patient Account Number: [id number] ?Date of Birth/Sex: Treating RN: ?06-13-1950 (71 y.o. F) Carla Stokes, Carla Stokes ?Primary Care Provider: Teressa Lower Other Clinician: ?Referring Provider: ?Treating Provider/Extender: Fredirick Maudlin ?Teressa Lower ?Weeks in Treatment: 7 ?Debridement Performed for Assessment: Wound #1R Left,Lateral Lower Leg ?Performed By: Physician Fredirick Maudlin, MD ?Debridement Type: Debridement ?Severity of Tissue Pre Debridement: Fat layer exposed ?Level of Consciousness (Pre-procedure): Awake and Alert ?Pre-procedure Verification/Time Out Yes - 10:46 ?Taken: ?Start Time: 10:46 ?Pain Control: ?Other : Benzocaine 20% ?T Area Debrided (L x W): ?otal 2 (cm) x 1.2 (cm) = 2.4 (cm?) ?Tissue and other material debrided: ?Non-Viable, Skin: Dermis , Biofilm ?Level: Skin/Dermis ?Debridement Description: Selective/Open Wound ?Instrument: Curette ?Bleeding: None ?Hemostasis Achieved: Pressure ?End Time: 10:47 ?Procedural Pain: 0 ?Post Procedural Pain: 0 ?Response to Treatment: Procedure was  tolerated well ?Level of Consciousness (Post- Awake and Alert ?procedure): ?Post Debridement Measurements of Total Wound ?Length: (cm) 2 ?Width: (cm) 1.2 ?Depth: (cm) 0.1 ?Volume: (cm?) 0.188 ?Character of Wound/Ulcer Post Debridement: Improved ?Severity of Tissue Post Debridement: Fat layer exposed ?Post Procedure Diagnosis ?Same as Pre-procedure ?Electronic Signature(s) ?Signed: 05/26/2021 12:03:30 PM By: Fredirick Maudlin MD FACS ?Signed: 05/26/2021 2:36:40 PM By: Dellie Catholic RN ?Entered By: Dellie Catholic on 05/26/2021 10:51:01 ?-------------------------------------------------------------------------------- ?HPI Details ?Patient Name: Date of Service: ?Carla Stokes, [name] NDA C. 05/26/2021 10:15 A M ?Medical Record Number: [id number] ?Patient Account Number: [id number] ?Date of Birth/Sex: Treating RN: ?1950-07-02 (71 y.o. F) ?Primary Care Provider: Teressa Lower Other Clinician: ?Referring Provider: ?Treating Provider/Extender: Fredirick Maudlin ?Teressa Lower ?Weeks in Treatment: 7 ?History of Present Illness ?HPI Description: ADMISSION ?04/07/2021 ?This is a 71 year old woman with type 2 diabetes. She states her problem began towards the end of November when she had a fall winter in the yard and ?injured her left lower leg. She has been followed by [facility]iatry with [facility]. Saw them on 03/02/2021 she was felt to have cellulitis of the left ankle and ?the left leg wound. She ultimately was felt to have a venous stasis ulcer and I believe was put in an [employer]. She is not wearing that currently. She had ?several further visits. The wound became increasingly painful and on 04/01/2021 she went to the emergency room. An x-ray of the area showed vascular ?calcification but no bony abnormality. She was put on doxycycline which she is still taking. She does not have a history of prior wounds or venous ?insufficiency that she knows about. ?Past medical history includes type 2 diabetes with a A1c of 7.5 in October,  hypertension, gout, hypercholesterolemia, diabetic foot ulcer and a history of PAD ?The patient follows with vascular surgery in [facility]. He was able to see that she has previously had vascular evaluation most recently in March 2022. ?Although I do not have the exact numbers it  is quoted in their last note it is showing noncompressible ABIs but normal TBI's and normal waveforms. As such ?there should not be any difficulty with sufficient blood flow to heal this wound. ?2/15; 2 small areas which were initially traumatic but then complicated by infection. We have been using Iodoflex. Both wounds had a very necrotic surface ?last week. ?04/20/2021: 2 small areas that were initially traumatic and then complicated by infection. Iodoflex has been used with clear improvement. The wounds continue ?to contract and are smaller again today without any necrosis, but with adherent slough. ?04/27/2021: The smaller, more distal area is almost closed. The Iodoflex is drying out too much and is pulling away tissue when it is removed. There is a small ?amount of slough present in the larger wound. There is minimal drainage. ?05/05/2021: Last week, due to the Iodoflex causing some local trauma when it was removed, which switched to Prisma. There has been no significant change in ?her wounds since that time. They basically look the same as they did last week. Minimal drainage. Small amount of slough in the larger wound. ?05/11/2021: The more distal wound is closed. The larger wound has contracted significantly with good epithelialization. The wound bed is much cleaner. ?Unfortunately, her 3 layer compression wrap was a bit too tight last week and she had to come into the clinic on Friday to have it redone. She has not ?experienced any further issues since the rewrap. ?05/18/2021: Her wound has healed. ?05/21/2021: We healed her wound out on Tuesday. On Wednesday evening, the patient reports that she was getting undressed and noticed some  pain at the ?wound site. There was a blister there that subsequently broke and began draining. She now has reopened this site. It is fairly superficial. No concern for ?infection. ?05/26/2021: The wound is superficial with a nice healthy-appearing base. No periwound erythema or induration. No significant drainage. Is smaller today by ?measurements. We have been using silver collagen under 3 layer compression. ?Electronic Signature(s) ?Signed: 05/26/2021 10:51:29 AM By: Fredirick Maudlin MD FACS ?Entered By: Fredirick Maudlin on 05/26/2021 10:51:29 ?-------------------------------------------------------------------------------- ?Physical Exam Details ?Patient Name: Date of Service: ?Carla Stokes, [name] NDA C. 05/26/2021 10:15 A M ?Medical Record Number: [id number] ?Patient Account Number: [id number] ?Date of Birth/Sex: Treating RN: ?22-Aug-1950 (70 y.o. F) ?Primary Care Provider: Teressa Lower Other Clinician: ?Referring Provider: ?Treating Provider/Extender: Fredirick Maudlin ?Teressa Lower ?Weeks in Treatment: 7 ?Constitutional ?. Bradycardic, asymptomatic.. . . No acute distress. ?Respiratory ?Normal work of breathing on room air. ?Notes ?05/26/2021: On her lateral left calf, there is a small open area in the same location that her prior wound had been. It is a bit smaller today. The wound surface is ?healthy-appearing with minimal slough. No concern for infection. ?Electronic Signature(s) ?Signed: 05/26/2021 10:52:42 AM By: Fredirick Maudlin MD FACS ?Entered By: Fredirick Maudlin on 05/26/2021 10:52:42 ?-------------------------------------------------------------------------------- ?Physician Orders Details ?Patient Name: Date of Service: ?[place], [name] NDA C. 05/26/2021 10:15 A M ?Medical Record Number: [id number] ?Patient Account Number: [id number] ?Date of Birth/Sex: Treating RN: ?1950/10/19 (71 y.o. F) Carla Stokes, Carla Stokes ?Primary Care Provider: Teressa Lower Other Clinician: ?Referring Provider: ?Treating Provider/Extender: Fredirick Maudlin ?Teressa Lower ?Weeks in Treatment: 7 ?Verbal / Phone Orders: No ?Diagnosis Coding ?ICD-10 Coding ?Code Description ?[id number] Abrasion, left lower leg, subsequent encounter ?[id number] Non-pressure chronic u

## 2021-06-02 ENCOUNTER — Encounter (HOSPITAL_BASED_OUTPATIENT_CLINIC_OR_DEPARTMENT_OTHER): Payer: Medicare HMO | Attending: General Surgery | Admitting: General Surgery

## 2021-06-02 DIAGNOSIS — I87332 Chronic venous hypertension (idiopathic) with ulcer and inflammation of left lower extremity: Secondary | ICD-10-CM | POA: Insufficient documentation

## 2021-06-02 DIAGNOSIS — E1140 Type 2 diabetes mellitus with diabetic neuropathy, unspecified: Secondary | ICD-10-CM | POA: Diagnosis not present

## 2021-06-02 DIAGNOSIS — X58XXXD Exposure to other specified factors, subsequent encounter: Secondary | ICD-10-CM | POA: Insufficient documentation

## 2021-06-02 DIAGNOSIS — S80812D Abrasion, left lower leg, subsequent encounter: Secondary | ICD-10-CM | POA: Insufficient documentation

## 2021-06-02 DIAGNOSIS — T2129XD Burn of second degree of other site of trunk, subsequent encounter: Secondary | ICD-10-CM | POA: Diagnosis not present

## 2021-06-02 DIAGNOSIS — L97828 Non-pressure chronic ulcer of other part of left lower leg with other specified severity: Secondary | ICD-10-CM | POA: Diagnosis not present

## 2021-06-02 NOTE — Progress Notes (Signed)
ARIE, GABLE ([id number]) ?Visit Report for 06/02/2021 ?Chief Complaint Document Details ?Patient Name: Date of Service: ?[name], [name]. 06/02/2021 8:30 A M ?Medical Record Number: [id number] ?Patient Account Number: [id number] ?Date of Birth/Sex: Treating RN: ?1950/08/30 (71 y.o. [name], [name] ?Primary Care Provider: Dina Rich Other Clinician: ?Referring Provider: ?Treating Provider/Extender: Duanne Guess ?Dina Rich ?Weeks in Treatment: 8 ?Information Obtained from: Patient ?Chief Complaint ?05/05/2021; patient is here for review of wound on the left lateral lower leg ?Electronic Signature(s) ?Signed: 06/02/2021 9:18:10 AM By: Duanne Guess MD FACS ?Entered By: Duanne Guess on 06/02/2021 09:18:10 ?-------------------------------------------------------------------------------- ?HPI Details ?Patient Name: Date of Service: ?[name], [name]. 06/02/2021 8:30 A M ?Medical Record Number: [id number] ?Patient Account Number: [id number] ?Date of Birth/Sex: Treating RN: ?03-25-1950 (71 y.o. [name], [name] ?Primary Care Provider: Dina Rich Other Clinician: ?Referring Provider: ?Treating Provider/Extender: Duanne Guess ?Dina Rich ?Weeks in Treatment: 8 ?History of Present Illness ?HPI Description: ADMISSION ?04/07/2021 ?This is a 71 year old woman with type 2 diabetes. She states her problem began towards the end of November when she had a fall winter in the yard and ?injured her left lower leg. She has been followed by podiatry with [facility]. Saw them on 03/02/2021 she was felt to have cellulitis of the left ankle and ?the left leg wound. She ultimately was felt to have a venous stasis ulcer and I believe was put in an [employer]. She is not wearing that currently. She had ?several further visits. The wound became increasingly painful and on 04/01/2021 she went to the emergency room. An x-ray of the area showed vascular ?calcification but no bony abnormality. She was put on  doxycycline which she is still taking. She does not have a history of prior wounds or venous ?insufficiency that she knows about. ?Past medical history includes type 2 diabetes with a A1c of 7.5 in October, hypertension, gout, hypercholesterolemia, diabetic foot ulcer and a history of PAD ?The patient follows with vascular surgery in [facility]. He was able to see that she has previously had vascular evaluation most recently in March 2022. ?Although I do not have the exact numbers it is quoted in their last note it is showing noncompressible ABIs but normal TBI's and normal waveforms. As such ?there should not be any difficulty with sufficient blood flow to heal this wound. ?2/15; 2 small areas which were initially traumatic but then complicated by infection. We have been using Iodoflex. Both wounds had a very necrotic surface ?last week. ?04/20/2021: 2 small areas that were initially traumatic and then complicated by infection. Iodoflex has been used with clear improvement. The wounds continue ?to contract and are smaller again today without any necrosis, but with adherent slough. ?04/27/2021: The smaller, more distal area is almost closed. The Iodoflex is drying out too much and is pulling away tissue when it is removed. There is a small ?amount of slough present in the larger wound. There is minimal drainage. ?05/05/2021: Last week, due to the Iodoflex causing some local trauma when it was removed, which switched to Prisma. There has been no significant change in ?her wounds since that time. They basically look the same as they did last week. Minimal drainage. Small amount of slough in the larger wound. ?05/11/2021: The more distal wound is closed. The larger wound has contracted significantly with good epithelialization. The wound bed is much cleaner. ?Unfortunately, her 3 layer compression wrap was a bit too tight last week and she had to  come into the clinic on Friday to have it redone. She has not ?experienced any  further issues since the rewrap. ?05/18/2021: Her wound has healed. ?05/21/2021: We healed her wound out on Tuesday. On Wednesday evening, the patient reports that she was getting undressed and noticed some pain at the ?wound site. There was a blister there that subsequently broke and began draining. She now has reopened this site. It is fairly superficial. No concern for ?infection. ?05/26/2021: The wound is superficial with a nice healthy-appearing base. No periwound erythema or induration. No significant drainage. Is smaller today by ?measurements. We have been using silver collagen under 3 layer compression. ?06/02/2021: The wound is a little bit smaller today. It remains superficial with a healthy-appearing base. No buildup of slough. Periwound is intact. Edema control ?is excellent. We have been using silver collagen under 3 layer compression. ?Electronic Signature(s) ?Signed: 06/02/2021 9:19:03 AM By: Duanne Guessannon, Reagen Haberman MD FACS ?Entered By: Duanne Guessannon, Kizzy Olafson on 06/02/2021 09:19:03 ?-------------------------------------------------------------------------------- ?Physical Exam Details ?Patient Name: Date of Service: ?MA [name], [place] NDA C. 06/02/2021 8:30 A M ?Medical Record Number: [id number] ?Patient Account Number: [id number] ?Date of Birth/Sex: Treating RN: ?01/07/1951 [age] y.o. Dorthula PerfectF) Lynch, [name] ?Primary Care Provider: Dina Richough, Robert Other Clinician: ?Referring Provider: ?Treating Provider/Extender: Duanne Guessannon, Demonte Dobratz ?Dina Richough, Robert ?Weeks in Treatment: 8 ?Notes ?06/02/2021: The wound continues to contract. The base is healthy with out any significant slough. The periwound skin is intact. ?Electronic Signature(s) ?Signed: 06/02/2021 9:22:45 AM By: Duanne Guessannon, Izaha Shughart MD FACS ?Entered By: Duanne Guessannon, Doyce Saling on 06/02/2021 09:22:45 ?-------------------------------------------------------------------------------- ?Physician Orders Details ?Patient Name: Date of Service: ?MA [name], [place] NDA C. 06/02/2021 8:30 A M ?Medical Record Number:  [id number] ?Patient Account Number: [id number] ?Date of Birth/Sex: Treating RN: ?07/22/1950 [age] y.o. Dorthula PerfectF) Lynch, [name] ?Primary Care Provider: Dina Richough, Robert Other Clinician: ?Referring Provider: ?Treating Provider/Extender: Duanne Guessannon, Justan Gaede ?Dina Richough, Robert ?Weeks in Treatment: 8 ?Verbal / Phone Orders: No ?Diagnosis Coding ?ICD-10 Coding ?Code Description ?N82.956OS80.812D Abrasion, left lower leg, subsequent encounter ?[id number] Non-pressure chronic ulcer of other part of left lower leg with other specified severity ?[id number] Chronic venous hypertension (idiopathic) with ulcer and inflammation of left lower extremity ?Follow-up Appointments ?ppointment in 1 week. - Dr. Lady Garyannon - Room 2 ?Return A ?Bathing/ Shower/ Hygiene ?May shower with protection but do not get wound dressing(s) wet. ?Edema Control - Lymphedema / SCD / Other ?Left Lower Extremity ?Elevate legs to the level of the heart or above for 30 minutes daily and/or when sitting, a frequency of: - 3-4 times a day throughout the day. ?Avoid standing for long periods of time. ?Exercise regularly ?Moisturize legs daily. - right leg every night before bed. ?Compression stocking or Garment 20-30 mm/Hg pressure to: - right leg daily ?Wound Treatment ?Wound #1R - Lower Leg Wound Laterality: Left, Lateral ?Cleanser: Soap and Water 1 x Per Week/15 Days ?Discharge Instructions: May shower and wash wound with dial antibacterial soap and water prior to dressing change. ?Cleanser: Wound Cleanser 1 x Per Week/15 Days ?Discharge Instructions: Cleanse the wound with wound cleanser prior to applying a clean dressing using gauze sponges, not tissue or cotton balls. ?Peri-Wound Care: Sween Lotion (Moisturizing lotion) 1 x Per Week/15 Days ?Discharge Instructions: Apply moisturizing lotion as directed ?Prim Dressing: Promogran Prisma Matrix, 4.34 (sq in) (silver collagen) 1 x Per Week/15 Days ?ary ?Discharge Instructions: Moisten collagen with saline or hydrogel ?Secondary Dressing: Woven  Gauze Sponge, Non-Sterile 4x4 in 1 x Per Week/15 Days ?Discharge Instructions: Apply over primary dressing as directed. ?Compression Wrap: ThreePress (3 layer compression wrap)  1 x Per Week/15 Days ?Discharge Instruct

## 2021-06-02 NOTE — Progress Notes (Signed)
Carla Stokes, Carla Stokes ([id number]) ?Visit Report for 06/02/2021 ?Arrival Information Details ?Patient Name: Date of Service: ?[name], [name]. 06/02/2021 8:30 A M ?Medical Record Number: [id number] ?Patient Account Number: [id number] ?Date of Birth/Sex: Treating RN: ?04-Feb-1951 (71 y.o. Carla Stokes, Carla Stokes ?Primary Care Margareta Laureano: Dina Rich Other Clinician: ?Referring Bolden Hagerman: ?Treating Ameliya Nicotra/Extender: Carla Stokes ?Dina Rich ?Weeks in Treatment: 8 ?Visit Information History Since Last Visit ?Added or deleted any medications: No ?Patient Arrived: Ambulatory ?Any new allergies or adverse reactions: No ?Arrival Time: 08:36 ?Had a fall or experienced change in No ?Accompanied By: sister ?activities of daily living that may affect ?Transfer Assistance: None ?risk of falls: ?Patient Identification Verified: Yes ?Signs or symptoms of abuse/neglect since last visito No ?Secondary Verification Process Completed: Yes ?Hospitalized since last visit: No ?Patient Requires Transmission-Based Precautions: No ?Implantable device outside of the clinic excluding No ?Patient Has Alerts: Yes ?cellular tissue based products placed in the center ?Patient Alerts: Bil ABIs N/C, TBIs WNL since last visit: ?Has Dressing in Place as Prescribed: Yes ?Has Compression in Place as Prescribed: Yes ?Pain Present Now: Yes ?Electronic Signature(s) ?Signed: 06/02/2021 7:00:57 PM By: Zandra Abts RN, BSN ?Entered By: Zandra Abts on 06/02/2021 08:53:50 ?-------------------------------------------------------------------------------- ?Compression Therapy Details ?Patient Name: Date of Service: ?[name], [name]. 06/02/2021 8:30 A M ?Medical Record Number: [id number] ?Patient Account Number: [id number] ?Date of Birth/Sex: Treating RN: ?1950-11-11 (71 y.o. Carla Stokes, Carla Stokes ?Primary Care Denya Buckingham: Dina Rich Other Clinician: ?Referring Carly Sabo: ?Treating Feleshia Zundel/Extender: Carla Stokes ?Dina Rich ?Weeks in Treatment: 8 ?Compression  Therapy Performed for Wound Assessment: Wound #1R Left,Lateral Lower Leg ?Performed By: Clinician Zandra Abts, RN ?Compression Type: Three Layer ?Post Procedure Diagnosis ?Same as Pre-procedure ?Electronic Signature(s) ?Signed: 06/02/2021 7:00:57 PM By: Zandra Abts RN, BSN ?Entered By: Zandra Abts on 06/02/2021 08:54:29 ?-------------------------------------------------------------------------------- ?Encounter Discharge Information Details ?Patient Name: ?Date of Service: ?[name], [name]. 06/02/2021 8:30 A M ?Medical Record Number: [id number] ?Patient Account Number: [id number] ?Date of Birth/Sex: ?Treating RN: ?[birth year] (71 y.o. Carla Stokes, Carla Stokes ?Primary Care Estefany Goebel: Dina Rich ?Other Clinician: ?Referring Tailor Westfall: ?Treating Charley Lafrance/Extender: Carla Stokes ?Dina Rich ?Weeks in Treatment: 8 ?Encounter Discharge Information Items ?Discharge Condition: Stable ?Ambulatory Status: Ambulatory ?Discharge Destination: Home ?Transportation: Private Auto ?Accompanied By: sister ?Schedule Follow-up Appointment: Yes ?Clinical Summary of Care: Patient Declined ?Electronic Signature(s) ?Signed: 06/02/2021 7:00:57 PM By: Zandra Abts RN, BSN ?Entered By: Zandra Abts on 06/02/2021 09:07:20 ?-------------------------------------------------------------------------------- ?Lower Extremity Assessment Details ?Patient Name: ?Date of Service: ?[name], [name]. 06/02/2021 8:30 A M ?Medical Record Number: [id number] ?Patient Account Number: [id number] ?Date of Birth/Sex: ?Treating RN: ?Jul 06, 1950 (71 y.o. Carla Stokes, Carla Stokes ?Primary Care Waldine Zenz: Dina Rich ?Other Clinician: ?Referring Viraat Vanpatten: ?Treating Kayce Chismar/Extender: Carla Stokes ?Dina Rich ?Weeks in Treatment: 8 ?Edema Assessment ?Assessed: [Left: No] [Right: No] ?Edema: [Left: Ye] [Right: s] ?Calf ?Left: Right: ?Point of Measurement: From Medial Instep 37 cm ?Ankle ?Left: Right: ?Point of Measurement: From Medial Instep 22 cm ?Vascular  Assessment ?Pulses: ?Dorsalis Pedis ?Palpable: [Left:Yes] ?Electronic Signature(s) ?Signed: 06/02/2021 7:00:57 PM By: Zandra Abts RN, BSN ?Entered By: Zandra Abts on 06/02/2021 08:45:57 ?-------------------------------------------------------------------------------- ?Multi Wound Chart Details ?Patient Name: ?Date of Service: ?[name], [name]. 06/02/2021 8:30 A M ?Medical Record Number: [id number] ?Patient Account Number: [id number] ?Date of Birth/Sex: ?Treating RN: ?23-Sep-1950 (71 y.o. Carla Stokes, Carla Stokes ?Primary Care Ewelina Naves: Dina Rich ?Other Clinician: ?Referring Kennet Mccort: ?Treating Saveah Bahar/Extender: Carla Stokes ?Dina Rich ?Weeks in Treatment: 8 ?Vital Signs ?Height(in): 67 ?Capillary Blood Glucose(mg/dl): 892 ?Weight(lbs): ?Pulse(bpm): 56 ?Body Mass Index(BMI): ?  Blood Pressure(mmHg): 147/77 ?Temperature(??F): 98.2 ?Respiratory Rate(breaths/min): 18 ?Photos: [N/A:N/A] ?Left, Lateral Lower Leg N/A N/A ?Wound Location: ?Trauma N/A N/A ?Wounding Event: ?Diabetic Wound/Ulcer of the Lower N/A N/A ?Primary Etiology: ?Extremity ?Venous Leg Ulcer N/A N/A ?Secondary Etiology: ?Cataracts, Hypertension, Type II N/A N/A ?Comorbid History: ?Diabetes, Osteoarthritis, Neuropathy ?01/20/2021 N/A N/A ?Date Acquired: ?8 N/A N/A ?Weeks of Treatment: ?Open N/A N/A ?Wound Status: ?Yes N/A N/A ?Wound Recurrence: ?1.5x0.7x0.1 N/A N/A ?Measurements L x W x D (cm) ?0.825 N/A N/A ?A (cm?) : ?rea ?0.082 N/A N/A ?Volume (cm?) : ?72.00% N/A N/A ?% Reduction in A rea: ?72.20% N/A N/A ?% Reduction in Volume: ?Grade 2 N/A N/A ?Classification: ?Medium N/A N/A ?Exudate A mount: ?Serosanguineous N/A N/A ?Exudate Type: ?red, brown N/A N/A ?Exudate Color: ?Distinct, outline attached N/A N/A ?Wound Margin: ?Large (67-100%) N/A N/A ?Granulation A mount: ?Red, Pink N/A N/A ?Granulation Quality: ?Small (1-33%) N/A N/A ?Necrotic A mount: ?Fat Layer (Subcutaneous Tissue): Yes N/A N/A ?Exposed Structures: ?Fascia: No ?Tendon: No ?Muscle:  No ?Joint: No ?Bone: No ?Medium (34-66%) N/A N/A ?Epithelialization: ?Compression Therapy N/A N/A ?Procedures Performed: ?Treatment Notes ?Wound #1R (Lower Leg) Wound Laterality: Left, Lateral ?Cleanser ?Soap and Water ?Discharge Instruction: May shower and wash wound with dial antibacterial soap and water prior to dressing change. ?Wound Cleanser ?Discharge Instruction: Cleanse the wound with wound cleanser prior to applying a clean dressing using gauze sponges, not tissue or cotton balls. ?Peri-Wound Care ?Sween Lotion (Moisturizing lotion) ?Discharge Instruction: Apply moisturizing lotion as directed ?Topical ?Primary Dressing ?Promogran Prisma Matrix, 4.34 (sq in) (silver collagen) ?Discharge Instruction: Moisten collagen with saline or hydrogel ?Secondary Dressing ?Woven Gauze Sponge, Non-Sterile 4x4 in ?Discharge Instruction: Apply over primary dressing as directed. ?Secured With ?Compression Wrap ?ThreePress (3 layer compression wrap) ?Discharge Instruction: Apply three layer compression as directed. ?Compression Stockings ?Add-Ons ?Electronic Signature(s) ?Signed: 06/02/2021 9:18:00 AM By: Carla Guess MD FACS ?Signed: 06/02/2021 7:00:57 PM By: Zandra Abts RN, BSN ?Entered By: Carla Stokes on 06/02/2021 09:18:00 ?-------------------------------------------------------------------------------- ?Multi-Disciplinary Care Plan Details ?Patient Name: ?Date of Service: ?[name], [name]. 06/02/2021 8:30 A M ?Medical Record Number: [id number] ?Patient Account Number: [id number] ?Date of Birth/Sex: ?Treating RN: ?13-Nov-1950 (71 y.o. Carla Stokes, Carla Stokes ?Primary Care Hildred Pharo: Dina Rich ?Other Clinician: ?Referring Gayland Nicol: ?Treating Kenderick Kobler/Extender: Carla Stokes ?Dina Rich ?Weeks in Treatment: 8 ?Multidisciplinary Care Plan reviewed with physician ?Active Inactive ?Wound/Skin Impairment ?Nursing Diagnoses: ?Impaired tissue integrity ?Knowledge deficit related to ulceration/compromised skin  integrity ?Goals: ?Patient/caregiver will verbalize understanding of skin care regimen ?Date Initiated: 04/07/2021 ?Target Resolution Date: 06/18/2021 ?Goal Status: Active ?Ulcer/skin breakdown will have a volume reduction

## 2021-06-09 ENCOUNTER — Encounter (HOSPITAL_BASED_OUTPATIENT_CLINIC_OR_DEPARTMENT_OTHER): Payer: Medicare HMO | Admitting: General Surgery

## 2021-06-09 DIAGNOSIS — S80812D Abrasion, left lower leg, subsequent encounter: Secondary | ICD-10-CM | POA: Diagnosis not present

## 2021-06-14 NOTE — Progress Notes (Signed)
VESTAL, CRANDALL ([id number]) ?Visit Report for 06/09/2021 ?Arrival Information Details ?Patient Name: Date of Service: ?Elsie Lincoln, [name] NDA C. 06/09/2021 8:30 A M ?Medical Record Number: [id number] ?Patient Account Number: [id number] ?Date of Birth/Sex: Treating RN: ?1950/09/15 (71 y.o. Benjamine Sprague, [name] ?Primary Care Fatmata Legere: Teressa Lower Other Clinician: ?Referring Britanee Vanblarcom: ?Treating Deni Berti/Extender: Fredirick Maudlin ?Teressa Lower ?Weeks in Treatment: 9 ?Visit Information History Since Last Visit ?Added or deleted any medications: No ?Patient Arrived: Ambulatory ?Any new allergies or adverse reactions: No ?Arrival Time: 08:54 ?Had a fall or experienced change in No ?Accompanied By: friend ?activities of daily living that may affect ?Transfer Assistance: None ?risk of falls: ?Patient Identification Verified: Yes ?Signs or symptoms of abuse/neglect since last visito No ?Secondary Verification Process Completed: Yes ?Hospitalized since last visit: No ?Patient Requires Transmission-Based Precautions: No ?Implantable device outside of the clinic excluding No ?Patient Has Alerts: Yes ?cellular tissue based products placed in the center ?Patient Alerts: Bil ABIs N/C, TBIs WNL since last visit: ?Has Dressing in Place as Prescribed: Yes ?Pain Present Now: No ?Electronic Signature(s) ?Signed: 06/09/2021 9:44:03 AM By: Sandre Kitty ?Entered By: Sandre Kitty on 06/09/2021 08:54:47 ?-------------------------------------------------------------------------------- ?Encounter Discharge Information Details ?Patient Name: Date of Service: ?Elsie [place], [name] NDA C. 06/09/2021 8:30 A M ?Medical Record Number: [id number] ?Patient Account Number: [id number] ?Date of Birth/Sex: Treating RN: ?1950/03/24 (71 y.o. F) Boehlein, Linda ?Primary Care Idris Edmundson: Teressa Lower Other Clinician: ?Referring Damesha Lawler: ?Treating Keshayla Schrum/Extender: Fredirick Maudlin ?Teressa Lower ?Weeks in Treatment: 9 ?Encounter Discharge Information Items Post  Procedure Vitals ?Discharge Condition: Stable ?Temperature (F): 97.9 ?Ambulatory Status: Ambulatory ?Pulse (bpm): 50 ?Discharge Destination: Home ?Respiratory Rate (breaths/min): 18 ?Transportation: Private Auto ?Blood Pressure (mmHg): 145/61 ?Accompanied By: sister ?Schedule Follow-up Appointment: Yes ?Clinical Summary of Care: Patient Declined ?Electronic Signature(s) ?Signed: 06/09/2021 6:08:52 PM By: Baruch Gouty RN, BSN ?Entered By: Baruch Gouty on 06/09/2021 09:52:38 ?-------------------------------------------------------------------------------- ?Lower Extremity Assessment Details ?Patient Name: ?Date of Service: ?[name], [name] NDA C. 06/09/2021 8:30 A M ?Medical Record Number: [id number] ?Patient Account Number: [id number] ?Date of Birth/Sex: ?Treating RN: ?1950-08-07 (71 y.o. Benjamine Sprague, [name] ?Primary Care Latravis Grine: Teressa Lower ?Other Clinician: ?Referring Lonell Stamos: ?Treating Jebidiah Baggerly/Extender: Fredirick Maudlin ?Teressa Lower ?Weeks in Treatment: 9 ?Edema Assessment ?Assessed: [Left: No] [Right: No] ?Edema: [Left: Ye] [Right: s] ?Calf ?Left: Right: ?Point of Measurement: From Medial Instep 36.9 cm ?Ankle ?Left: Right: ?Point of Measurement: From Medial Instep 22.5 cm ?Vascular Assessment ?Pulses: ?Dorsalis Pedis ?Palpable: [Left:Yes] ?Electronic Signature(s) ?Signed: 06/09/2021 4:07:42 PM By: Adline Peals ?Signed: 06/14/2021 5:45:33 PM By: Levan Hurst RN, BSN ?Entered By: Adline Peals on 06/09/2021 09:13:55 ?-------------------------------------------------------------------------------- ?Multi Wound Chart Details ?Patient Name: ?Date of Service: ?[name], [name] NDA C. 06/09/2021 8:30 A M ?Medical Record Number: [id number] ?Patient Account Number: [id number] ?Date of Birth/Sex: ?Treating RN: ?1950/05/04 (71 y.o. Benjamine Sprague, [name] ?Primary Care Arleta Ostrum: Teressa Lower ?Other Clinician: ?Referring Sebron Mcmahill: ?Treating Otha Rickles/Extender: Fredirick Maudlin ?Teressa Lower ?Weeks in Treatment: 9 ?Vital  Signs ?Height(in): 67 ?Capillary Blood Glucose(mg/dl): 156 ?Weight(lbs): ?Pulse(bpm): 50 ?Body Mass Index(BMI): ?Blood Pressure(mmHg): 145/61 ?Temperature(??F): 97.9 ?Respiratory Rate(breaths/min): 18 ?Photos: [1R:Left, Lateral Lower Leg] [2:Right Breast] [N/A:N/A N/A] ?Wound Location: [1R:Trauma] [2:Blister] [N/A:N/A] ?Wounding Event: [1R:Diabetic Wound/Ulcer of the Lower] [2:Trauma, Other] [N/A:N/A] ?Primary Etiology: [1R:Extremity Venous Leg Ulcer] [2:N/A] [N/A:N/A] ?Secondary Etiology: [1R:Cataracts, Hypertension, Type II] [2:Cataracts, Hypertension, Type II] [N/A:N/A] ?Comorbid History: [1R:Diabetes, Osteoarthritis, Neuropathy Diabetes, Osteoarthritis, Neuropathy 01/20/2021] [2:06/02/2021] [N/A:N/A] ?Date Acquired: [1R:9] [2:0] [N/A:N/A] ?Weeks of Treatment: [1R:Open] [2:Open] [N/A:N/A] ?Wound Status: [1R:Yes] [2:No] [N/A:N/A] ?Wound Recurrence: [1R:0.9x0.3x0.1] [2:1.1x1.1x0.1] [N/A:N/A] ?Measurements L x  W x D (cm) [1R:0.212] [2:0.95] [N/A:N/A] ?A (cm?) : ?rea [1R:0.021] [2:0.095] [N/A:N/A] ?Volume (cm?) : [1R:92.80%] [2:0.00%] [N/A:N/A] ?% Reduction in A [1R:rea: 92.90%] [2:0.00%] [N/A:N/A] ?% Reduction in Volume: [1R:Grade 2] [2:Partial Thickness] [N/A:N/A] ?Classification: [1R:Medium] [2:Medium] [N/A:N/A] ?Exudate A mount: [1R:Serosanguineous] [2:Serosanguineous] [N/A:N/A] ?Exudate Type: [1R:red, brown] [2:red, brown] [N/A:N/A] ?Exudate Color: [1R:Distinct, outline attached] [2:Distinct, outline attached] [N/A:N/A] ?Wound Margin: [1R:Large (67-100%)] [2:Medium (34-66%)] [N/A:N/A] ?Granulation A mount: [1R:Red, Pink] [2:Red, Pink] [N/A:N/A] ?Granulation Quality: [1R:Small (1-33%)] [2:Medium (34-66%)] [N/A:N/A] ?Necrotic A mount: ?[1R:Fat Layer (Subcutaneous Tissue): Yes Fat Layer (Subcutaneous Tissue): Yes N/A] ?Exposed Structures: ?[1R:Fascia: No Tendon: No Muscle: No Joint: No Bone: No Large (67-100%)] [2:Fascia: No Tendon: No Muscle: No Joint: No Bone: No None] [N/A:N/A] ?Epithelialization: [1R:Debridement  - Selective/Open Wound N/A] [N/A:N/A] ?Debridement: ?Pre-procedure Verification/Time Out 09:25 [2:N/A] [N/A:N/A] ?Taken: [1R:Other] [2:N/A] [N/A:N/A] ?Pain Control: [1R:Necrotic/Eschar] [2:N/A] [N/A:N/A] ?Tissue Debrided: [1R:Non-Viable Tissue] [2:N/A] [N/A:N/A] ?Level: [1R:0.27] [2:N/A] [N/A:N/A] ?Debridement A (sq cm): [1R:rea Curette] [2:N/A] [N/A:N/A] ?Instrument: [1R:Minimum] [2:N/A] [N/A:N/A] ?Bleeding: [1R:Pressure] [2:N/A] [N/A:N/A] ?Hemostasis A chieved: [1R:0] [2:N/A] [N/A:N/A] ?Procedural Pain: [1R:0] [2:N/A] [N/A:N/A] ?Post Procedural Pain: [1R:Procedure was tolerated well] [2:N/A] [N/A:N/A] ?Debridement Treatment Response: [1R:0.9x0.3x0.1] [2:N/A] [N/A:N/A] ?Post Debridement Measurements L x ?W x D (cm) [1R:0.021] [2:N/A] [N/A:N/A] ?Post Debridement Volume: (cm?) [1R:Debridement] [2:Dressings and/or debridement of] [N/A:N/A] ?Procedures Performed: [2:burns; small] ?Treatment Notes ?Electronic Signature(s) ?Signed: 06/09/2021 9:36:15 AM By: Fredirick Maudlin MD FACS ?Signed: 06/14/2021 5:45:33 PM By: Levan Hurst RN, BSN ?Entered By: Fredirick Maudlin on 06/09/2021 09:36:15 ?-------------------------------------------------------------------------------- ?Multi-Disciplinary Care Plan Details ?Patient Name: ?Date of Service: ?[name], [name] NDA C. 06/09/2021 8:30 A M ?Medical Record Number: [id number] ?Patient Account Number: [id number] ?Date of Birth/Sex: ?Treating RN: ?[birth year] (71 y.o. Benjamine Sprague, [name] ?Primary Care Patrick Sohm: Teressa Lower ?Other Clinician: ?Referring Eura Radabaugh: ?Treating Amri Lien/Extender: Fredirick Maudlin ?Teressa Lower ?Weeks in Treatment: 9 ?Multidisciplinary Care Plan reviewed with physician ?Active Inactive ?Wound/Skin Impairment ?Nursing Diagnoses: ?Impaired tissue integrity ?Knowledge deficit related to ulceration/compromised skin integrity ?Goals: ?Patient/caregiver will verbalize understanding of skin care regimen ?Date Initiated: 04/07/2021 ?Target Resolution Date: 07/15/2021 ?Goal  Status: Active ?Ulcer/skin breakdown will have a volume reduction of 30% by week 4 ?Date Initiated: 04/07/2021 ?Date Inactivated: 05/05/2021 ?Target Resolution Date: 05/05/2021 ?Goal Status: Met ?Interventions:
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
## 2021-06-16 ENCOUNTER — Encounter (HOSPITAL_BASED_OUTPATIENT_CLINIC_OR_DEPARTMENT_OTHER): Payer: Medicare HMO | Admitting: General Surgery

## 2021-06-16 DIAGNOSIS — S80812D Abrasion, left lower leg, subsequent encounter: Secondary | ICD-10-CM | POA: Diagnosis not present
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
## 2021-06-16 NOTE — Progress Notes (Signed)
TYLOR, GAMBRILL ([id number]) ?Visit Report for 06/16/2021 ?Arrival Information Details ?Patient Name: Date of Service: ?[name], [name]. 06/16/2021 12:45 PM ?Medical Record Number: [id number] ?Patient Account Number: [id number] ?Date of Birth/Sex: Treating RN: ?Apr 13, 1950 (71 y.o. Carla Stokes, Carla Stokes ?Primary Care Ameris Akamine: Dina Rich Other Clinician: ?Referring Ariannie Penaloza: ?Treating Bayla Mcgovern/Extender: Duanne Guess ?Dina Rich ?Weeks in Treatment: 10 ?Visit Information History Since Last Visit ?Added or deleted any medications: No ?Patient Arrived: Ambulatory ?Any new allergies or adverse reactions: No ?Arrival Time: 12:51 ?Had a fall or experienced change in No ?Accompanied By: self ?activities of daily living that may affect ?Transfer Assistance: None ?risk of falls: ?Patient Identification Verified: Yes ?Signs or symptoms of abuse/neglect since last visito No ?Secondary Verification Process Completed: Yes ?Hospitalized since last visit: No ?Patient Requires Transmission-Based Precautions: No ?Implantable device outside of the clinic excluding No ?Patient Has Alerts: Yes ?cellular tissue based products placed in the center ?Patient Alerts: Bil ABIs N/C, TBIs WNL since last visit: ?Has Dressing in Place as Prescribed: Yes ?Pain Present Now: No ?Electronic Signature(s) ?Signed: 06/16/2021 3:16:57 PM By: Karl Ito ?Entered By: Karl Ito on 06/16/2021 12:52:10 ?-------------------------------------------------------------------------------- ?Clinic Level of Care Assessment Details ?Patient Name: Date of Service: ?[name], [name]. 06/16/2021 12:45 PM ?Medical Record Number: [id number] ?Patient Account Number: [id number] ?Date of Birth/Sex: Treating RN: ?12/10/1950 (71 y.o. Carla Stokes, Carla Stokes ?Primary Care Mima Cranmore: Dina Rich Other Clinician: ?Referring Nehemiah Mcfarren: ?Treating Naidelyn Parrella/Extender: Duanne Guess ?Dina Rich ?Weeks in Treatment: 10 ?Clinic Level of Care Assessment Items ?TOOL 4  Quantity Score ?X- 1 0 ?Use when only an EandM is performed on FOLLOW-UP visit ?ASSESSMENTS - Nursing Assessment / Reassessment ?X- 1 10 ?Reassessment of Co-morbidities (includes updates in patient status) ?X- 1 5 ?Reassessment of Adherence to Treatment Plan ?ASSESSMENTS - Wound and Skin A ssessment / Reassessment ?[]  - 0 ?Simple Wound Assessment / Reassessment - one wound ?X- 2 5 ?Complex Wound Assessment / Reassessment - multiple wounds ?[]  - 0 ?Dermatologic / Skin Assessment (not related to wound area) ?ASSESSMENTS - Focused Assessment ?[]  - 0 ?Circumferential Edema Measurements - multi extremities ?[]  - 0 ?Nutritional Assessment / Counseling / Intervention ?X- 1 5 ?Lower Extremity Assessment (monofilament, tuning fork, pulses) ?[]  - 0 ?Peripheral Arterial Disease Assessment (using hand held doppler) ?ASSESSMENTS - Ostomy and/or Continence Assessment and Care ?[]  - 0 ?Incontinence Assessment and Management ?[]  - 0 ?Ostomy Care Assessment and Management (repouching, etc.) ?PROCESS - Coordination of Care ?X - Simple Patient / Family Education for ongoing care 1 15 ?[]  - 0 ?Complex (extensive) Patient / Family Education for ongoing care ?X- 1 10 ?Staff obtains Consents, Records, T Results / Process Orders ?est ?[]  - 0 ?Staff telephones HHA, Nursing Homes / Clarify orders / etc ?[]  - 0 ?Routine Transfer to another Facility (non-emergent condition) ?[]  - 0 ?Routine Hospital Admission (non-emergent condition) ?[]  - 0 ?New Admissions / / Ordering NPWT Apligraf, etc. ?, ?[]  - 0 ?Emergency Hospital Admission (emergent condition) ?X- 1 10 ?Simple Discharge Coordination ?[]  - 0 ?Complex (extensive) Discharge Coordination ?PROCESS - Special Needs ?[]  - 0 ?Pediatric / Minor Patient Management ?[]  - 0 ?Isolation Patient Management ?[]  - 0 ?Hearing / Language / Visual special needs ?[]  - 0 ?Assessment of Community assistance (transportation, D/C planning, etc.) ?[]  - 0 ?Additional assistance / Altered  mentation ?[]  - 0 ?Support Surface(s) Assessment (bed, cushion, seat, etc.) ?INTERVENTIONS - Wound Cleansing / Measurement ?[]  - 0 ?Simple Wound Cleansing - one wound ?X- 2 5 ?Complex  Wound Cleansing - multiple wounds ?X- 1 5 ?Wound Imaging (photographs - any number of wounds) ?[]  - 0 ?Wound Tracing (instead of photographs) ?[]  - 0 ?Simple Wound Measurement - one wound ?X- 2 5 ?Complex Wound Measurement - multiple wounds ?INTERVENTIONS - Wound Dressings ?[]  - 0 ?Small Wound Dressing one or multiple wounds ?[]  - 0 ?Medium Wound Dressing one or multiple wounds ?[]  - 0 ?Large Wound Dressing one or multiple wounds ?[]  - 0 ?Application of Medications - topical ?[]  - 0 ?Application of Medications - injection ?INTERVENTIONS - Miscellaneous ?[]  - 0 ?External ear exam ?[]  - 0 ?Specimen Collection (cultures, biopsies, blood, body fluids, etc.) ?[]  - 0 ?Specimen(s) / Culture(s) sent or taken to Lab for analysis ?[]  - 0 ?Patient Transfer (multiple staff / / Similar devices) ?[]  - 0 ?Simple Staple / Suture removal (25 or less) ?[]  - 0 ?Complex Staple / Suture removal (26 or more) ?[]  - 0 ?Hypo / Hyperglycemic Management (close monitor of Blood Glucose) ?[]  - 0 ?Ankle / Brachial Index (ABI) - do not check if billed separately ?X- 1 5 ?Vital Signs ?Has the patient been seen at the hospital within the last three years: Yes ?Total Score: 95 ?Level Of Care: New/Established - Level 3 ?Electronic Signature(s) ?Signed: 06/16/2021 5:49:13 PM By: RN, BSN ?Entered By: on 06/16/2021 13:05:33 ?-------------------------------------------------------------------------------- ?Encounter Discharge Information Details ?Patient Name: Date of Service: ?MA , NDA C. 06/16/2021 12:45 PM ?Medical Record Number: ?Patient Account Number: ?Date of Birth/Sex: Treating RN: ?April 12, 1950 (71 y.o. , Carla Stokes ?Primary Care Amberleigh Gerken: Other Clinician: ?Referring  Laurren Lepkowski: ?Treating Luisalberto Beegle/Extender: ? ?Weeks in Treatment: 10 ?Encounter Discharge Information Items ?Discharge Condition: Stable ?Ambulatory Status: Ambulatory ?Discharge Destination: Home ?Transportation: Private Auto ?Accompanied By: alone ?Schedule Follow-up Appointment: No ?Clinical Summary of Care: Patient Declined ?Electronic Signature(s) ?Signed: 06/16/2021 5:49:13 PM By: Zandra Abts RN, BSN ?Entered By: Zandra Abts on 06/16/2021 13:08:44 ?-------------------------------------------------------------------------------- ?Lower Extremity Assessment Details ?Patient Name: Date of Service: ?[name], [name]. 06/16/2021 12:45 PM ?Medical Record Number: [id number] ?Patient Account Number: [id number] ?Date of Birth/Sex: Treating RN: ?February 12, 1951 (71 y.o. Carla Stokes, Carla Stokes ?Primary Care Jazsmin Couse: Dina Rich Other Clinician: ?Referring Letanya Froh: ?Treating Cyndel Griffey/Extender: Duanne Guess ?Dina Rich ?Weeks in Treatment: 10 ?Edema Assessment ?Assessed: [Left: No] [Right: No] ?Edema: [Left: Ye] [Right: s] ?Calf ?Left: Right: ?Point of Measurement: From Medial Instep 36 cm ?Ankle ?Left: Right: ?Point of Measurement: From Medial Instep 22 cm ?Vascular Assessment ?Pulses: ?Dorsalis Pedis ?Palpable: [Left:Yes] ?Electronic Signature(s) ?Signed: 06/16/2021 5:49:13 PM By: Zandra Abts RN, BSN ?Entered By: Zandra Abts on 06/16/2021 12:59:42 ?-------------------------------------------------------------------------------- ?Multi Wound Chart Details ?Patient Name: ?Date of Service: ?[name], [name]. 06/16/2021 12:45 PM ?Medical Record Number: [id number] ?Patient Account Number: [id number] ?Date of Birth/Sex: ?Treating RN: ?16-Nov-1950 (71 y.o. Carla Stokes, Carla Stokes ?Primary Care Salah Nakamura: Dina Rich ?Other Clinician: ?Referring Linh Johannes: ?Treating Abigal Choung/Extender: Duanne Guess ?Dina Rich ?Weeks in Treatment: 10 ?Vital Signs ?Height(in): 67 ?Capillary Blood Glucose(mg/dl):  06/18/2021 ?Weight(lbs): ?Pulse(bpm): 71 ?Body Mass Index(BMI): ?Blood Pressure(mmHg): 151/77 ?Temperature(??F): 98.4 ?Respiratory Rate(breaths/min): 18 ?Photos: [N/A:N/A] ?Left, Lateral Lower Leg Right Breast N/A ?Wound Locati

## 2021-10-20 ENCOUNTER — Emergency Department (HOSPITAL_COMMUNITY): Payer: Medicare HMO

## 2021-10-20 ENCOUNTER — Other Ambulatory Visit: Payer: Self-pay

## 2021-10-20 ENCOUNTER — Emergency Department (HOSPITAL_COMMUNITY)
Admission: EM | Admit: 2021-10-20 | Discharge: 2021-10-21 | Disposition: A | Discharge status: Home / Self Care | Payer: Medicare HMO | Attending: Emergency Medicine | Admitting: Emergency Medicine

## 2021-10-20 ENCOUNTER — Encounter (HOSPITAL_COMMUNITY): Payer: Self-pay | Admitting: Emergency Medicine

## 2021-10-20 DIAGNOSIS — E11621 Type 2 diabetes mellitus with foot ulcer: Secondary | ICD-10-CM | POA: Insufficient documentation

## 2021-10-20 DIAGNOSIS — R6883 Chills (without fever): Secondary | ICD-10-CM | POA: Diagnosis not present

## 2021-10-20 DIAGNOSIS — L089 Local infection of the skin and subcutaneous tissue, unspecified: Secondary | ICD-10-CM | POA: Diagnosis present

## 2021-10-20 DIAGNOSIS — E119 Type 2 diabetes mellitus without complications: Secondary | ICD-10-CM | POA: Insufficient documentation

## 2021-10-20 DIAGNOSIS — Z4800 Encounter for change or removal of nonsurgical wound dressing: Secondary | ICD-10-CM | POA: Diagnosis not present

## 2021-10-20 DIAGNOSIS — L97518 Non-pressure chronic ulcer of other part of right foot with other specified severity: Secondary | ICD-10-CM | POA: Diagnosis not present

## 2021-10-20 DIAGNOSIS — R209 Unspecified disturbances of skin sensation: Secondary | ICD-10-CM | POA: Insufficient documentation

## 2021-10-20 DIAGNOSIS — I10 Essential (primary) hypertension: Secondary | ICD-10-CM | POA: Diagnosis not present

## 2021-10-20 DIAGNOSIS — Z5189 Encounter for other specified aftercare: Secondary | ICD-10-CM

## 2021-10-20 LAB — CBC WITH DIFFERENTIAL/PLATELET
Abs Immature Granulocytes: 0.01 10*3/uL (ref 0.00–0.07)
Basophils Absolute: 0 10*3/uL (ref 0.0–0.1)
Basophils Relative: 1 %
Eosinophils Absolute: 0.1 10*3/uL (ref 0.0–0.5)
Eosinophils Relative: 2 %
HCT: 40.9 % (ref 36.0–46.0)
Hemoglobin: 14 g/dL (ref 12.0–15.0)
Immature Granulocytes: 0 %
Lymphocytes Relative: 27 %
Lymphs Abs: 1.3 10*3/uL (ref 0.7–4.0)
MCH: 26.3 pg (ref 26.0–34.0)
MCHC: 34.2 g/dL (ref 30.0–36.0)
MCV: 76.7 fL — ABNORMAL LOW (ref 80.0–100.0)
Monocytes Absolute: 0.4 10*3/uL (ref 0.1–1.0)
Monocytes Relative: 9 %
Neutro Abs: 2.9 10*3/uL (ref 1.7–7.7)
Neutrophils Relative %: 61 %
Platelets: 181 10*3/uL (ref 150–400)
RBC: 5.33 MIL/uL — ABNORMAL HIGH (ref 3.87–5.11)
RDW: 13.9 % (ref 11.5–15.5)
WBC: 4.7 10*3/uL (ref 4.0–10.5)
nRBC: 0 % (ref 0.0–0.2)

## 2021-10-20 LAB — COMPREHENSIVE METABOLIC PANEL
ALT: 59 U/L — ABNORMAL HIGH (ref 0–44)
AST: 56 U/L — ABNORMAL HIGH (ref 15–41)
Albumin: 3.9 g/dL (ref 3.5–5.0)
Alkaline Phosphatase: 60 U/L (ref 38–126)
Anion gap: 10 (ref 5–15)
BUN: 16 mg/dL (ref 8–23)
CO2: 29 mmol/L (ref 22–32)
Calcium: 9.9 mg/dL (ref 8.9–10.3)
Chloride: 100 mmol/L (ref 98–111)
Creatinine, Ser: 0.98 mg/dL (ref 0.44–1.00)
GFR, Estimated: >60 mL/min (ref >60)
Glucose, Bld: 139 mg/dL — ABNORMAL HIGH (ref 70–99)
Potassium: 3.3 mmol/L — ABNORMAL LOW (ref 3.5–5.1)
Sodium: 139 mmol/L (ref 135–145)
Total Bilirubin: 0.4 mg/dL (ref 0.3–1.2)
Total Protein: 7.8 g/dL (ref 6.5–8.1)

## 2021-10-20 LAB — PROTIME-INR
INR: 1 (ref 0.8–1.2)
Prothrombin Time: 13.5 seconds (ref 11.4–15.2)

## 2021-10-20 LAB — LACTIC ACID, PLASMA: Lactic Acid, Venous: 1.2 mmol/L (ref 0.5–1.9)

## 2021-10-20 MED ORDER — POTASSIUM CHLORIDE CRYS ER 20 MEQ PO TBCR
40.0000 meq | EXTENDED_RELEASE_TABLET | Freq: Once | ORAL | Status: AC
  Administered 2021-10-21: 40 meq via ORAL
  Filled 2021-10-20: qty 2

## 2021-10-20 MED ORDER — SULFAMETHOXAZOLE-TRIMETHOPRIM 800-160 MG PO TABS: 1.0000 | ORAL_TABLET | Freq: Two times a day (BID) | ORAL | 0 refills | Status: DC

## 2021-10-20 MED ORDER — OXYCODONE-ACETAMINOPHEN 5-325 MG PO TABS: 1.0000 | ORAL_TABLET | ORAL | Status: DC | PRN

## 2021-10-20 MED ORDER — GADOBUTROL 1 MMOL/ML IV SOLN
10.0000 mL | Freq: Once | INTRAVENOUS | Status: AC | PRN
  Administered 2021-10-20: 10 mL via INTRAVENOUS

## 2021-10-20 NOTE — ED Provider Triage Note (Signed)
Emergency Medicine Provider Triage Evaluation Note  Carla Stokes , a 71 y.o. female  was evaluated in triage.  Pt complains of right foot infection.  Patient evaluated PCP prior to arrival and sent to the ED due to concern about unhealing wound.  Patient had an x-ray prior to arrival which demonstrated: 1.  No overt osseous destruction to suggest acute osteomyelitis. If there is persistent concern could further evaluate with MRI.  2.  No acute fracture or malalignment. 3.  Moderate degenerative changes of the first MTP joint and articulations of the mid and hindfoot. Bulky calcaneal enthesophytes. 4.  Diffuse atherosclerosis.    No fever  Review of Systems  Positive: wound Negative: fever  Physical Exam  BP (!) 125/94   Pulse (!) 106   Temp 98.7 F (37.1 C) (Oral)   Resp 16   SpO2 98%  Gen:   Awake, no distress   Resp:  Normal effort  MSK:   Moves extremities without difficulty  Other:    Medical Decision Making  Medically screening exam initiated at 4:12 PM.  Appropriate orders placed.  Micheal Likens was informed that the remainder of the evaluation will be completed by another provider, this initial triage assessment does not replace that evaluation, and the importance of remaining in the ED until their evaluation is complete.  Infectious labs ordered   Jesusita Oka 10/20/21 1612

## 2021-10-20 NOTE — ED Provider Notes (Signed)
Blood pressure (!) 141/91, pulse (!) 55, temperature 97.9 F (36.6 C), temperature source Oral, resp. rate 14, SpO2 100 %.  Assuming care from Dr. Durwin Nora.  In short, Carla Stokes is a 71 y.o. female with a chief complaint of Wound Check .  Refer to the original H&P for additional details.  The current plan of care is to follow up on MRI and reassess.  02:13 AM  Overnight wet read from radiology describes not appreciating any fracture, osteo, or abscess. Formal read to follow in the AM. Patient will be discharged home with Bactrim Rx. Patient to follow with podiatry this week. Contact information provided.     Maia Plan, MD 10/21/21 [phone]

## 2021-10-20 NOTE — ED Provider Notes (Signed)
[facility] EMERGENCY DEPARTMENT Provider Note   CSN: [id number] Arrival date & time: 10/20/21  1451     History  Chief Complaint  Patient presents with   Wound Check    Carla Stokes is a 71 y.o. female.   Wound Check  Patient comes for concern of right foot infection.  Medical history includes DM, HLD, HTN. Patient reports that she was getting a pedicure 2 weeks ago.  At that time, she sustained a wound to the plantar aspect of her right great toe.  Since that time, wound has not healed and the surrounding swelling and pain have worsened.  4 days ago, she experienced chills.  She was seen at outside ED and diagnosed with a UTI.  She was started on a antibiotic for UTI that she is still taking.  She has had resolution of chills but continues to have the worsened pain and swelling to her right great toe.  Patient was seen in urgent care prior to arrival.  She was into the ED for further management.  Currently, she describes the pain as mild and tingling in nature.  She denies any other current symptoms.     Home Medications Prior to Admission medications   Medication Sig Start Date End Date Taking? Authorizing Provider  sulfamethoxazole-trimethoprim (BACTRIM DS) 800-160 MG tablet Take 1 tablet by mouth 2 (two) times daily for 7 days. 10/20/21 10/27/21 Yes Gloris Manchester, MD  HYDROcodone-acetaminophen (NORCO/VICODIN) 5-325 MG tablet Take 1-2 tablets by mouth every 6 (six) hours as needed for moderate pain or severe pain. 05/18/18   Shaune Pollack, MD      Allergies    Patient has no known allergies.    Review of Systems   Review of Systems  Skin:  Positive for wound.  All other systems reviewed and are negative.   Physical Exam Updated Vital Signs BP (!) 141/91   Pulse (!) 55   Temp 97.9 F (36.6 C) (Oral)   Resp 14   SpO2 100%  Physical Exam Vitals and nursing note reviewed.  Constitutional:      General: She is not in acute distress.     Appearance: Normal appearance. She is well-developed. She is not ill-appearing, toxic-appearing or diaphoretic.  HENT:     Head: Normocephalic and atraumatic.     Right Ear: External ear normal.     Left Ear: External ear normal.     Nose: Nose normal.     Mouth/Throat:     Mouth: Mucous membranes are moist.     Pharynx: Oropharynx is clear.  Eyes:     Extraocular Movements: Extraocular movements intact.     Conjunctiva/sclera: Conjunctivae normal.  Cardiovascular:     Rate and Rhythm: Normal rate and regular rhythm.     Heart sounds: No murmur heard. Pulmonary:     Effort: Pulmonary effort is normal. No respiratory distress.     Breath sounds: Normal breath sounds. No wheezing or rales.  Abdominal:     General: There is no distension.     Palpations: Abdomen is soft.     Tenderness: There is no abdominal tenderness.  Musculoskeletal:        General: Tenderness present. No swelling. Normal range of motion.     Cervical back: Normal range of motion and neck supple.     Right lower leg: No edema.     Left lower leg: No edema.  Skin:    General: Skin is warm and dry.  Findings: Erythema present.     Comments: Wound to plantar surface of right great toe.  Wound is open.  There is no current drainage.  Neurological:     General: No focal deficit present.     Mental Status: She is alert and oriented to person, place, and time.     Cranial Nerves: No cranial nerve deficit.     Sensory: No sensory deficit.     Motor: No weakness.     Coordination: Coordination normal.  Psychiatric:        Mood and Affect: Mood normal.        Behavior: Behavior normal.        Thought Content: Thought content normal.        Judgment: Judgment normal.        ED Results / Procedures / Treatments   Labs (all labs ordered are listed, but only abnormal results are displayed) Labs Reviewed  COMPREHENSIVE METABOLIC PANEL - Abnormal; Notable for the following components:      Result Value    Potassium 3.3 (*)    Glucose, Bld 139 (*)    AST 56 (*)    ALT 59 (*)    All other components within normal limits  CBC WITH DIFFERENTIAL/PLATELET - Abnormal; Notable for the following components:   RBC 5.33 (*)    MCV 76.7 (*)    All other components within normal limits  CULTURE, BLOOD (ROUTINE X 2)  CULTURE, BLOOD (ROUTINE X 2)  LACTIC ACID, PLASMA  PROTIME-INR  LACTIC ACID, PLASMA    EKG None  Radiology No results found.  Procedures Procedures    Medications Ordered in ED Medications  oxyCODONE-acetaminophen (PERCOCET/ROXICET) 5-325 MG per tablet 1 tablet (has no administration in time range)  potassium chloride SA (KLOR-CON M) CR tablet 40 mEq (has no administration in time range)  gadobutrol (GADAVIST) 1 MMOL/ML injection 10 mL (10 mLs Intravenous Contrast Given 10/20/21 2340)    ED Course/ Medical Decision Making/ A&P                           Medical Decision Making Amount and/or Complexity of Data Reviewed Labs: ordered. Radiology: ordered.  Risk Prescription drug management.   This patient presents to the ED for concern of wound infection, this involves an extensive number of treatment options, and is a complaint that carries with it a high risk of complications and morbidity.  The differential diagnosis includes diabetic foot ulcer, osteomyelitis, cellulitis   Co morbidities that complicate the patient evaluation  DM, HLD, HTN   Additional history obtained:  Additional history obtained from N/A External records from outside source obtained and reviewed including EMR   Lab Tests:  I Ordered, and personally interpreted labs.  The pertinent results include: Mild hypokalemia, no leukocytosis, normal lactate   Imaging Studies ordered:  I ordered imaging studies including MRI of foot I independently visualized and interpreted imaging which showed (results pending at time of signout) I agree with the radiologist interpretation   Cardiac  Monitoring: / EKG:  The patient was maintained on a cardiac monitor.  I personally viewed and interpreted the cardiac monitored which showed an underlying rhythm of: Sinus rhythm   Consultations Obtained:  I requested consultation with the podiatrist, Dr. Logan Bores,  and discussed lab and imaging findings as well as pertinent plan - they recommend: MRI.  If MRI is negative for osteomyelitis, patient can be discharged on Bactrim DS with close podiatry  follow-up.   Problem List / ED Course / Critical interventions / Medication management  Patient is a 71 year old female presenting for worsening pain and swelling to area of right great toe over the past 2 weeks.  She did sustain a wound to the plantar aspect of this toe 2 weeks ago.  3 days ago, patient was seen in outside ED for fever, malaise, and nausea.  At time of that ED visit, she had a fever of 103.1 degrees.  She was diagnosed with a UTI and started on Keflex, 500 mg twice daily.  On review of her urine studies at that time, although urine was positive for bacteria and nitrites, there was no WBCs.  Alternative source of infection is suspected.  Patient's lab work during her prior visit, as well as today, is unremarkable.  She went to urgent care prior to arrival and did undergo x-ray of right foot.  This did not show any acute findings.  Patient is well-appearing on arrival in the ED.  She is afebrile at this time.  She does have some swelling and erythema to her right great toe and wound is present without drainage on the plantar aspect.  She denies any significant pain at this time and declines any analgesia.  Given concern for severe foot infection, an MRI was ordered.  I spoke with podiatrist on-call, Dr. Logan Bores, who agrees with MRI.  He does feel that if MRI is negative, she can be discharged on Bactrim with close podiatry follow-up.  Will hold on antibiotics for now, pending MRI results.  Care of patient was signed out to oncoming ED provider. I  ordered medication including potassium chloride for hypokalemia Reevaluation of the patient after these medicines showed that the patient stayed the same I have reviewed the patients home medicines and have made adjustments as needed   Social Determinants of Health:  Has access to outpatient care         Final Clinical Impression(s) / ED Diagnoses Final diagnoses:  Visit for wound check    Rx / DC Orders ED Discharge Orders          Ordered    sulfamethoxazole-trimethoprim (BACTRIM DS) 800-160 MG tablet  2 times daily        10/20/21 2344              Gloris Manchester, MD 10/20/21 2349

## 2021-10-20 NOTE — ED Triage Notes (Signed)
Patient sent to ED from PCP for evaluation of wound on right great toe that started two days ago, note from PCP state concern for unhealing wound. Patient is alert, oriented, afebrile, and in no apparent distress at this time.

## 2021-10-20 NOTE — ED Notes (Signed)
Pt assisted to bathroom with wheelchair assist

## 2021-10-20 NOTE — Discharge Instructions (Addendum)
A prescription for a new antibiotic was sent to your pharmacy.  You can stop taking Keflex and start taking this new prescribed medication.  There is a telephone number below to call for a podiatry follow-up.  Call soon as possible.  Return to the emergency department for any worsening of symptoms.

## 2021-10-20 NOTE — ED Notes (Signed)
Patient transported to MRI 

## 2021-10-21 MED ORDER — SULFAMETHOXAZOLE-TRIMETHOPRIM 800-160 MG PO TABS: 1.0000 | ORAL_TABLET | Freq: Two times a day (BID) | ORAL | 0 refills | Status: DC

## 2021-10-21 MED ORDER — SULFAMETHOXAZOLE-TRIMETHOPRIM 800-160 MG PO TABS
1.0000 | ORAL_TABLET | Freq: Once | ORAL | Status: AC
  Administered 2021-10-21: 1 via ORAL
  Filled 2021-10-21: qty 1

## 2021-10-21 NOTE — ED Notes (Signed)
Patient verbalizes understanding of discharge instructions. Opportunity for questioning and answers were provided. Armband removed by staff, pt discharged from ED. Wheeled out to lobby  

## 2021-10-25 LAB — CULTURE, BLOOD (ROUTINE X 2)
Culture: NO GROWTH
Special Requests: ADEQUATE

## 2021-10-26 ENCOUNTER — Ambulatory Visit: Payer: Medicare HMO | Admitting: Podiatry

## 2021-10-26 DIAGNOSIS — E0843 Diabetes mellitus due to underlying condition with diabetic autonomic (poly)neuropathy: Secondary | ICD-10-CM | POA: Diagnosis not present

## 2021-10-26 DIAGNOSIS — L97512 Non-pressure chronic ulcer of other part of right foot with fat layer exposed: Secondary | ICD-10-CM | POA: Diagnosis not present

## 2021-10-26 MED ORDER — SULFAMETHOXAZOLE-TRIMETHOPRIM 800-160 MG PO TABS: 1.0000 | ORAL_TABLET | Freq: Two times a day (BID) | ORAL | 0 refills | Status: AC

## 2021-10-26 NOTE — Progress Notes (Signed)
   Chief Complaint  Patient presents with   Follow-up    Right foot wound follow-up.patient was seen in the emergency room.    Subjective:  71 y.o. female with PMHx of diabetes mellitus presenting today as a new patient referral from the ED for evaluation of an ulcer to the plantar aspect of the right great toe.  Patient states that she received a pedicure about 2-3 weeks ago and that is when she developed the ulcer.  She presented to the emergency department for redness and swelling of the toe and was ultimately discharged on oral Bactrim DS.  While in the ED MRI was taken which was negative for fracture, abscess, or osteomyelitis.  Patient presenting for follow-up treatment and evaluation   Past Medical History:  Diagnosis Date   Diabetes mellitus without complication (HCC)    High cholesterol    Hypertension     Past Surgical History:  Procedure Laterality Date   ABDOMINAL HYSTERECTOMY     No Known Allergies     Objective/Physical Exam General: The patient is alert and oriented x3 in no acute distress.  Dermatology:  Wound #1 noted to the plantar aspect of the right great toe measuring 1.3x1.0 x 0.2 cm (LxWxD).   To the noted ulceration(s), there is no eschar.  Wound base is mostly fibrotic.  There is some granular tissue around the borders of the wound.  There does not appear to be any exposed tendon bone or joint.  No malodor.  Serous drainage noted.  Periwound is callused.  Please see above noted photo  Vascular: Palpable pedal pulses bilaterally.  Localized edema with erythema noted to the right great toe  Neurological: Light touch and protective threshold diminished bilaterally.   Musculoskeletal Exam: No pedal deformity.  No prior amputations  Assessment: 1.  Ulcer right great toe secondary to diabetes mellitus 2. diabetes mellitus w/ peripheral neuropathy   Plan of Care:  1. Patient was evaluated. 2. medically necessary excisional debridement including  subcutaneous tissue was performed using a tissue nipper and a chisel blade. Excisional debridement of all the necrotic nonviable tissue down to healthy bleeding viable tissue was performed with post-debridement measurements same as pre-. 3.  Hydrofera Blue with a light dressing was applied to the wound base.  Supplies provided to apply daily 4.  Patient has a surgical shoe at home.  Wear daily.  Recommend reducing activity and resting/elevating foot is much as possible  5.  Refill prescription for Bactrim DS #20  6.  Return to clinic in 3 weeks   Felecia Shelling, [name] [facility]  Dr. Felecia Shelling, [name]    [address] [place], [place] [address]                Office [phone]  Fax [phone]

## 2021-11-19 ENCOUNTER — Ambulatory Visit: Payer: Medicare HMO | Admitting: Podiatry

## 2021-11-19 DIAGNOSIS — E0843 Diabetes mellitus due to underlying condition with diabetic autonomic (poly)neuropathy: Secondary | ICD-10-CM

## 2021-11-19 DIAGNOSIS — L97512 Non-pressure chronic ulcer of other part of right foot with fat layer exposed: Secondary | ICD-10-CM | POA: Diagnosis not present

## 2021-11-19 MED ORDER — GENTAMICIN SULFATE 0.1 % EX CREA: 1.0000 | TOPICAL_CREAM | Freq: Two times a day (BID) | CUTANEOUS | 1 refills | Status: DC

## 2021-11-19 NOTE — Progress Notes (Signed)
   Chief Complaint  Patient presents with   Wound Check    Patient states that she is here for wound check on right foot, she states that things are healing fine.    Subjective:  71 y.o. female with PMHx of diabetes mellitus presenting today as a new patient referral from the ED for evaluation of an ulcer to the plantar aspect of the right great toe.  Patient states that she received a pedicure about 2-3 weeks ago and that is when she developed the ulcer.  She presented to the emergency department for redness and swelling of the toe and was ultimately discharged on oral Bactrim DS.  While in the ED MRI was taken which was negative for fracture, abscess, or osteomyelitis.  Patient presenting for follow-up treatment and evaluation   Past Medical History:  Diagnosis Date   Diabetes mellitus without complication ([place])    High cholesterol    Hypertension     Past Surgical History:  Procedure Laterality Date   ABDOMINAL HYSTERECTOMY     No Known Allergies      Objective/Physical Exam General: The patient is alert and oriented x3 in no acute distress.  Dermatology:  Significant improvement noted.  Now the right great toe measures only approximately 0.8x0.7x0.3 cm (LxWxD).   To the noted ulceration(s), there is no eschar.  Wound base is mostly fibrotic.  There is some granular tissue around the borders of the wound.  There does not appear to be any exposed tendon bone or joint.  No malodor.  Serous drainage noted.  Periwound is callused.  Please see above noted photo  Vascular: Erythema and edema appears significantly improved.  Neurological: Light touch and protective threshold diminished bilaterally.   Musculoskeletal Exam: No pedal deformity.  No prior amputations  Assessment: 1.  Ulcer right great toe secondary to diabetes mellitus 2. diabetes mellitus w/ peripheral neuropathy   Plan of Care:  1. Patient was evaluated. 2. medically necessary excisional debridement  including subcutaneous tissue was performed using a tissue nipper and a chisel blade. Excisional debridement of all the necrotic nonviable tissue down to healthy bleeding viable tissue was performed with post-debridement measurements same as pre-. 3.  Patient completed the Bactrim DS as prescribed 4.  Prescription for gentamicin cream.  Apply daily with a light dressing 5.  Continue postsurgical shoe 6.  Appointment with orthotics department for diabetic shoes with molded insoles 7.  Order placed for ABIs to establish a baseline 8.  Return to clinic 3 weeks  Edrick Kins, [name] [facility]  Dr. Edrick Kins, [name]    [address], [place] [address]                Office [phone]  Fax [phone]

## 2021-11-19 NOTE — Addendum Note (Signed)
Addended by: Lolita Rieger on: 11/19/2021 09:37 AM   Modules accepted: Orders

## 2021-11-23 ENCOUNTER — Other Ambulatory Visit: Payer: Self-pay | Admitting: Podiatry

## 2021-11-23 DIAGNOSIS — E0843 Diabetes mellitus due to underlying condition with diabetic autonomic (poly)neuropathy: Secondary | ICD-10-CM

## 2021-11-23 DIAGNOSIS — L97512 Non-pressure chronic ulcer of other part of right foot with fat layer exposed: Secondary | ICD-10-CM

## 2021-11-30 ENCOUNTER — Ambulatory Visit (HOSPITAL_COMMUNITY)
Admission: RE | Admit: 2021-11-30 | Discharge: 2021-11-30 | Disposition: A | Discharge status: Home / Self Care | Payer: Medicare HMO | Source: Ambulatory Visit | Attending: Cardiology | Admitting: Cardiology

## 2021-11-30 DIAGNOSIS — E0843 Diabetes mellitus due to underlying condition with diabetic autonomic (poly)neuropathy: Secondary | ICD-10-CM | POA: Insufficient documentation

## 2021-11-30 DIAGNOSIS — L97512 Non-pressure chronic ulcer of other part of right foot with fat layer exposed: Secondary | ICD-10-CM

## 2021-12-01 ENCOUNTER — Ambulatory Visit: Payer: Medicare HMO | Admitting: Podiatry

## 2021-12-01 ENCOUNTER — Ambulatory Visit (INDEPENDENT_AMBULATORY_CARE_PROVIDER_SITE_OTHER): Payer: Medicare HMO

## 2021-12-01 DIAGNOSIS — L97512 Non-pressure chronic ulcer of other part of right foot with fat layer exposed: Secondary | ICD-10-CM | POA: Diagnosis not present

## 2021-12-01 DIAGNOSIS — E0843 Diabetes mellitus due to underlying condition with diabetic autonomic (poly)neuropathy: Secondary | ICD-10-CM | POA: Diagnosis not present

## 2021-12-01 DIAGNOSIS — L03031 Cellulitis of right toe: Secondary | ICD-10-CM

## 2021-12-01 MED ORDER — CIPROFLOXACIN HCL 500 MG PO TABS: 500.0000 mg | ORAL_TABLET | Freq: Two times a day (BID) | ORAL | 0 refills | Status: AC

## 2021-12-01 MED ORDER — SULFAMETHOXAZOLE-TRIMETHOPRIM 800-160 MG PO TABS: 1.0000 | ORAL_TABLET | Freq: Two times a day (BID) | ORAL | 0 refills | Status: DC

## 2021-12-01 NOTE — Progress Notes (Signed)
   Chief Complaint  Patient presents with   Foot Pain    Patient is here for right foot pain and throbbing, the patient states that she has had some drainage.    Subjective:  71 y.o. female with PMHx of diabetes mellitus (A1c 7.9 on 06/16/2021) presenting today for follow-up evaluation of an ulcer to the right great toe.  Patient currently not taking any oral antibiotics.  She states that she has been applying the gentamicin cream as instructed.  She has pain and tenderness associated to the toe.  She is also weightbearing in the postsurgical shoe.  Denies any fever or chills malaise nausea or loss of appetite   Past Medical History:  Diagnosis Date   Diabetes mellitus without complication (HCC)    High cholesterol    Hypertension     Past Surgical History:  Procedure Laterality Date   ABDOMINAL HYSTERECTOMY     No Known Allergies        Objective/Physical Exam General: The patient is alert and oriented x3 in no acute distress.  Dermatology:  Increased localized erythema noted to the right great toe.  Heavy amount of callus and peeling skin around the ulcer area.  No malodor.  Purulent serous drainage noted.  Vascular: Erythema with edema noted right great toe VAS [place] LE ART SEG MULTI 11/30/2021 Preliminary results demonstrate adequate flow right lower extremity.  Final results pending  Neurological: Light touch and protective threshold diminished bilaterally.   Musculoskeletal Exam: No pedal deformity.  No prior amputations  Assessment: 1.  Ulcer right great toe secondary to diabetes mellitus 2. diabetes mellitus w/ peripheral neuropathy   Plan of Care:  1. Patient was evaluated. 2. medically necessary excisional debridement including subcutaneous tissue was performed using a tissue nipper and a chisel blade. Excisional debridement of all the necrotic nonviable tissue down to healthy bleeding viable tissue was performed with post-debridement measurements same as  pre-. 3.  Continue gentamicin cream daily with a light dressing 4.  Prescription for Bactrim DS as well as ciprofloxacin 500 mg #20 x 10 days 5.  Continue weightbearing in the postsurgical shoe 6.  Cultures also taken and sent to pathology for cultures and sensitivity  7.  Return to clinic in 2 weeks.  Recommend that the patient goes immediately to the emergency department if she begins noticing any systemic signs of infection including fever chills nausea vomiting or malaise.  Also if she notices red nests and streaking extending into her foot  Edrick Kins, [name] [facility]  Dr. Edrick Kins, [name]    [address], [place] [address]                Office [phone]  Fax [phone]

## 2021-12-01 NOTE — Addendum Note (Signed)
Addended by: Myles Rosenthal on: 12/01/2021 11:16 AM   Modules accepted: Orders

## 2021-12-02 ENCOUNTER — Telehealth: Payer: Self-pay | Admitting: Podiatry

## 2021-12-02 MED ORDER — TRAMADOL HCL 50 MG PO TABS: 50.0000 mg | ORAL_TABLET | Freq: Four times a day (QID) | ORAL | 0 refills | Status: AC | PRN

## 2021-12-02 NOTE — Telephone Encounter (Signed)
Thanks

## 2021-12-02 NOTE — Telephone Encounter (Signed)
Patient called requesting pain medication she said she is having shootling pains where Dr Amalia Hailey cut on her foot yesterday.  She said they are sharp like labor pains and hasnt got any rest at all.  Patient states the Tylenol is not touching the pain.

## 2021-12-02 NOTE — Telephone Encounter (Signed)
Patient notified

## 2021-12-05 LAB — WOUND CULTURE
MICRO NUMBER:: 14011060
SPECIMEN QUALITY:: ADEQUATE

## 2021-12-08 ENCOUNTER — Telehealth: Payer: Self-pay | Admitting: *Deleted

## 2021-12-08 NOTE — Telephone Encounter (Signed)
error 

## 2021-12-08 NOTE — Telephone Encounter (Signed)
Pt has been rescheduled for a sooner appt to see Dr. Loel Lofty on Thurs 12/09/21 at 11:15AM. A voice message was left to make pt aware of this change.

## 2021-12-08 NOTE — Telephone Encounter (Signed)
Patient is calling because her foot is hurting and appears to have a black abcess /or blister developing,draining , first noticed 1 day ago. She is also requesting a refill of the gentamicin cream sent to pharmacy on file

## 2021-12-09 ENCOUNTER — Ambulatory Visit: Payer: Medicare HMO | Admitting: Podiatry

## 2021-12-09 ENCOUNTER — Ambulatory Visit (INDEPENDENT_AMBULATORY_CARE_PROVIDER_SITE_OTHER): Payer: Medicare HMO

## 2021-12-09 DIAGNOSIS — L97512 Non-pressure chronic ulcer of other part of right foot with fat layer exposed: Secondary | ICD-10-CM | POA: Diagnosis not present

## 2021-12-09 DIAGNOSIS — L03031 Cellulitis of right toe: Secondary | ICD-10-CM | POA: Diagnosis not present

## 2021-12-09 DIAGNOSIS — I739 Peripheral vascular disease, unspecified: Secondary | ICD-10-CM | POA: Diagnosis not present

## 2021-12-09 DIAGNOSIS — E0843 Diabetes mellitus due to underlying condition with diabetic autonomic (poly)neuropathy: Secondary | ICD-10-CM | POA: Diagnosis not present

## 2021-12-09 MED ORDER — GENTAMICIN SULFATE 0.1 % EX OINT: 1.0000 | TOPICAL_OINTMENT | Freq: Two times a day (BID) | CUTANEOUS | 0 refills | Status: DC

## 2021-12-09 NOTE — Progress Notes (Signed)
Subjective:  Patient ID: Carla Stokes, female    DOB: August 01, 1950,  MRN: [id number]  Chief Complaint  Patient presents with   Wound Check    Room 15  Pt states she had a water blister on the area, feels like the shoe is rubbing on the wound     71 y.o. female presents for follow-up on right foot ulceration at the great toe.  She says she thinks that overall has been improving since last visit.  She previously saw Dr. Amalia Hailey on October 4.  She finished course of Bactrim per recommendations.  She is using gentamicin cream as instructed.  Weightbearing in a postoperative shoe.  Denies nausea vomiting fever chills.  Past Medical History:  Diagnosis Date   Diabetes mellitus without complication ([place])    High cholesterol    Hypertension     Allergies  Allergen Reactions   Dapagliflozin Other (See Comments)    2017: yeast infection    ROS: Negative except as per HPI above  Objective:  General: AAO x3, NAD  Dermatological: Attention directed to the right hallux there is noted to be a eschar present at the dorsal medial aspect of the hallux IPJ.  At the plantar medial aspect of the hallux IPJ there is a small ulceration present that probes to subcutaneous fat tissue.  Minimal edema or erythema of the right great toe at this time.  Small serous drainage.  No malodor.  Upon debridement the wound measures approximately 0.2 x 0.2 x 0.3 cm  Vascular:  Dorsalis Pedis artery and Posterior Tibial artery pedal pulses are non palpable bilateral.  Capillary fill time < 3 sec to all digits.   Neruologic: Grossly intact via light touch bilateral. Protective threshold intact to all sites bilateral.   Musculoskeletal: No gross boney pedal deformities bilateral. No pain, crepitus, or limitation noted with foot and ankle range of motion bilateral. Muscular strength 5/5 in all groups tested bilateral.  Gait: Unassisted, Nonantalgic.       Radiographs:  Date: 12/09/21 XR the right foot  Weightbearing AP/Lateral/Oblique   Findings: No distinct evidence of osseous erosion in the hallux distal phalanx or proximal phalanx.  No soft tissue emphysema is seen.   Assessment:   1. 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 Diabetes mellitus due to underlying condition with diabetic autonomic neuropathy, unspecified whether long term insulin use ([place])   3. PAD (peripheral artery disease) (HCC)   4. Cellulitis of great toe of right foot      Plan:  Patient was evaluated and treated and all questions answered.  #Chronic ulcer of right hallux with fat layer exposed, improving with antibiotic ointment #Right lower extremity peripheral arterial disease -Discussed with the patient that overall I believe the ulceration to the right great toe is improving at this time.  Recommend continue use of gentamicin ointment which I agree prescribed per the patient's request that she is running low. -I recommend referral to vascular surgery given abnormal findings on the recent ABI study that was performed and diminished pulses right lower extremity. -Possible need for MRI repeat of the right foot in the future if wound worsens or fails to improve given wound and negative x-ray. -No indications for oral antibiotics at this time. -Continue weightbearing in the postsurgical shoe -Patient has follow-up scheduled with Dr. Amalia Hailey for next week.  Return for next week for wound check.          Haydee Monica Leannah Guse,  [name] [place] [facility]

## 2021-12-13 ENCOUNTER — Other Ambulatory Visit: Payer: Medicare HMO

## 2021-12-13 ENCOUNTER — Ambulatory Visit: Payer: Medicare HMO | Admitting: Podiatry

## 2021-12-15 ENCOUNTER — Other Ambulatory Visit: Payer: Medicare HMO

## 2021-12-15 ENCOUNTER — Ambulatory Visit: Payer: Medicare HMO | Admitting: Podiatry

## 2021-12-15 DIAGNOSIS — E0843 Diabetes mellitus due to underlying condition with diabetic autonomic (poly)neuropathy: Secondary | ICD-10-CM

## 2021-12-15 DIAGNOSIS — L97512 Non-pressure chronic ulcer of other part of right foot with fat layer exposed: Secondary | ICD-10-CM

## 2021-12-15 NOTE — Progress Notes (Signed)
   Chief Complaint  Patient presents with   Wound Check    Patient is here for right foot wound check.    Subjective:  71 y.o. female with PMHx of diabetes mellitus (A1c 7.9 on 06/16/2021) presenting today for follow-up evaluation of an ulcer to the right great toe.  Overall the patient states that she is significantly better.  She has been applying Betadine to the toe and taking the oral antibiotics as prescribed.  She is also wearing the postsurgical shoe.  No new complaints at this time   Past Medical History:  Diagnosis Date   Diabetes mellitus without complication ([place])    High cholesterol    Hypertension     Past Surgical History:  Procedure Laterality Date   ABDOMINAL HYSTERECTOMY     Allergies  Allergen Reactions   Dapagliflozin Other (See Comments)    2017: yeast infection       Objective/Physical Exam General: The patient is alert and oriented x3 in no acute distress.  Dermatology:  Improved erythema noted to the right great toe.  There can continues to be callus and peeling skin around the ulcer area.  No malodor.  Significantly improved with reduction of the serous purulent drainage.  Overall improvement  Vascular: Erythema with edema noted right great toe VAS [place] LE ART SEG MULTI 11/30/2021 Summary:  Right: Resting right ankle-brachial index indicates noncompressible right lower extremity arteries. The right toe-brachial index is normal. Normal multiphasic waveforms  Left: Resting left ankle-brachial index indicates noncompressible left  lower extremity arteries. The left toe-brachial index is abnormal.   Neurological: Light touch and protective threshold diminished bilaterally.   Musculoskeletal Exam: No pedal deformity.  No prior amputations  Assessment: 1.  Ulcer right great toe secondary to diabetes mellitus 2. diabetes mellitus w/ peripheral neuropathy   Plan of Care:  1. Patient was evaluated. 2. medically necessary excisional debridement  including subcutaneous tissue was performed using a tissue nipper and a chisel blade. Excisional debridement of all the necrotic nonviable tissue down to healthy bleeding viable tissue was performed with post-debridement measurements same as pre-. 3.  Continue Betadine daily with a light dressing 4.  Continue Bactrim DS as well as ciprofloxacin 500 mg #20 x 10 days until completed 5.  Continue weightbearing in the postsurgical shoe 6.  Return to clinic 2 weeks  Edrick Kins, [name] [facility]  Dr. Edrick Kins, [name]    [address], [place] [address]                Office [phone]  Fax [phone]

## 2021-12-22 ENCOUNTER — Ambulatory Visit: Payer: Medicare HMO | Admitting: Podiatry

## 2021-12-22 DIAGNOSIS — E0843 Diabetes mellitus due to underlying condition with diabetic autonomic (poly)neuropathy: Secondary | ICD-10-CM | POA: Diagnosis not present

## 2021-12-22 DIAGNOSIS — L97512 Non-pressure chronic ulcer of other part of right foot with fat layer exposed: Secondary | ICD-10-CM

## 2021-12-28 ENCOUNTER — Telehealth: Payer: Self-pay | Admitting: *Deleted

## 2021-12-28 NOTE — Telephone Encounter (Signed)
Patient is calling because she is wanting to know if she should still schedule for the Vascular surgery referred by Dr Jonah Blue seen Dr Amalia Hailey twice and thought he said not to worry about scheduling. She wants clarification on whether or not to schedule, they keep calling, not sure what to do. Please advise.

## 2021-12-29 ENCOUNTER — Other Ambulatory Visit: Payer: Self-pay | Admitting: Podiatry

## 2021-12-29 NOTE — Telephone Encounter (Signed)
Okay not to schedule with vascular based on vascular studies.  Please notify patient.  Thanks,-Dr. Amalia Hailey

## 2021-12-29 NOTE — Progress Notes (Signed)
   Chief Complaint  Patient presents with   Wound Check    Patient is here for wounc check on right foot great toe.patient states that she is healing very well.    Subjective:  71 y.o. female with PMHx of diabetes mellitus (A1c 7.9 on 06/16/2021) presenting today for follow-up evaluation of an ulcer to the right great toe.  Overall improvement.  Patient has noticed significant improvement over the past week.  She presents for further treatment and evaluation  Past Medical History:  Diagnosis Date   Diabetes mellitus without complication ([place])    High cholesterol    Hypertension     Past Surgical History:  Procedure Laterality Date   ABDOMINAL HYSTERECTOMY     Allergies  Allergen Reactions   Dapagliflozin Other (See Comments)    2017: yeast infection    Objective/Physical Exam General: The patient is alert and oriented x3 in no acute distress.  Dermatology:  Significant improvement of the appearance of the wound.  Wound now measures approximately 0.5 x 0.5 x 0.1 cm.  Healthy granular wound base.  No purulence or drainage.  Overall significant improvement  Vascular: Erythema with edema noted right great toe VAS [place] LE ART SEG MULTI 11/30/2021 Preliminary results demonstrate adequate flow right lower extremity.  Final results pending  Neurological: Light touch and protective threshold diminished bilaterally.   Musculoskeletal Exam: No pedal deformity.  No prior amputations  Assessment: 1.  Ulcer right great toe secondary to diabetes mellitus 2. diabetes mellitus w/ peripheral neuropathy   Plan of Care:  1. Patient was evaluated. 2. medically necessary excisional debridement including subcutaneous tissue was performed using a tissue nipper and a chisel blade. Excisional debridement of all the necrotic nonviable tissue down to healthy bleeding viable tissue was performed with post-debridement measurements same as pre-. 3.  Continue gentamicin cream daily with a light  dressing 4.  Finish Bactrim DS as well as ciprofloxacin 500 mg #20 x 10 days until completed 5.  Continue weightbearing in the postsurgical shoe 6.  Return to clinic 3 weeks for follow-up x-ray  Edrick Kins, [name] [facility]  Dr. Edrick Kins, [name]    [address], [place] [address]                Office [phone]  Fax [phone]

## 2021-12-30 NOTE — Telephone Encounter (Addendum)
Spoke with patient giving information per physician.

## 2022-01-12 ENCOUNTER — Ambulatory Visit: Payer: Medicare HMO | Admitting: Podiatry

## 2022-01-12 DIAGNOSIS — L97512 Non-pressure chronic ulcer of other part of right foot with fat layer exposed: Secondary | ICD-10-CM | POA: Diagnosis not present

## 2022-01-12 DIAGNOSIS — E0843 Diabetes mellitus due to underlying condition with diabetic autonomic (poly)neuropathy: Secondary | ICD-10-CM | POA: Diagnosis not present

## 2022-01-12 NOTE — Progress Notes (Signed)
Chief Complaint  Patient presents with   wound care    Patient is here for right foot wound care, she states that she is still having some drainage.    Subjective:  71 y.o. female with PMHx of diabetes mellitus (A1c 7.9 on 06/16/2021) presenting today for follow-up evaluation of an ulcer to the right great toe.  Overall improvement.  Patient has noticed significant improvement over the past week.  She presents for further treatment and evaluation  Past Medical History:  Diagnosis Date   Diabetes mellitus without complication (HCC)    High cholesterol    Hypertension     Past Surgical History:  Procedure Laterality Date   ABDOMINAL HYSTERECTOMY     Allergies  Allergen Reactions   Dapagliflozin Other (See Comments)    2017: yeast infection    Objective/Physical Exam General: The patient is alert and oriented x3 in no acute distress.  Dermatology:  Significant improvement of the appearance of the wound.  Wound now measures approximately 0.5 x 0.5 x 0.1 cm.  Healthy granular wound base.  No purulence or drainage.  Overall significant improvement  Vascular: Erythema with edema noted right great toe VAS [place] LE ART SEG MULTI 11/30/2021   ABI Findings:  +---------+------------------+-----+-----------+--------+  Right   Rt Pressure (mmHg)IndexWaveform   Comment   +---------+------------------+-----+-----------+--------+  Brachial 137                                         +---------+------------------+-----+-----------+--------+  CFA                            triphasic            +---------+------------------+-----+-----------+--------+  Popliteal                      triphasic            +---------+------------------+-----+-----------+--------+  PTA     209               1.53 triphasic            +---------+------------------+-----+-----------+--------+  PERO    208               1.52 biphasic              +---------+------------------+-----+-----------+--------+  DP      182               1.33 multiphasic          +---------+------------------+-----+-----------+--------+  Great Toe112               0.82 Normal               +---------+------------------+-----+-----------+--------+   +---------+------------------+-----+-----------+-------+  Left    Lt Pressure (mmHg)IndexWaveform   Comment  +---------+------------------+-----+-----------+-------+  Brachial 131                                        +---------+------------------+-----+-----------+-------+  CFA                            triphasic           +---------+------------------+-----+-----------+-------+  Popliteal  multiphasic         +---------+------------------+-----+-----------+-------+  PTA     254               1.85 multiphasic         +---------+------------------+-----+-----------+-------+  PERO    254               1.85 multiphasic         +---------+------------------+-----+-----------+-------+  DP      254               1.85 triphasic           +---------+------------------+-----+-----------+-------+  Great Toe83                0.61 Abnormal            +---------+------------------+-----+-----------+-------+   +-------+-----------+-----------+------------+------------+  ABI/TBIToday's ABIToday's TBIPrevious ABIPrevious TBI  +-------+-----------+-----------+------------+------------+  Right [place]         0.82                                 +-------+-----------+-----------+------------+------------+  Left  [place]         0.61                                 +-------+-----------+-----------+------------+------------+   Summary:  Right: Resting right ankle-brachial index indicates noncompressible right  lower extremity arteries. The right toe-brachial index is normal. Normal  multiphasic waveforms     Left: Resting left  ankle-brachial index indicates noncompressible left  lower extremity arteries. The left toe-brachial index is abnormal.   Neurological: Light touch and protective threshold diminished bilaterally.   Musculoskeletal Exam: No pedal deformity.  No prior amputations  Assessment: 1.  Ulcer right great toe secondary to diabetes mellitus w/ concern for underlying abscess/osteomyelitis 2. diabetes mellitus w/ peripheral neuropathy   Plan of Care:  1. Patient was evaluated. 2. medically necessary excisional debridement including subcutaneous tissue was performed using a tissue nipper and a chisel blade. Excisional debridement of all the necrotic nonviable tissue down to healthy bleeding viable tissue was performed with post-debridement measurements same as pre-. 3.  Continue gentamicin cream daily with a light dressing 4. Continue weightbearing in the postsurgical shoe 5. MRI ordered toes w/o contrast to better evaluate the toe and determine if there is any residual underlying abscess or even possible osteomyelitis 6.  Return to clinic 3 weeks  Edrick Kins, [name] [facility]  Dr. Edrick Kins, [name]    [address], [place] [address]                Office [phone]  Fax [phone]

## 2022-01-18 ENCOUNTER — Encounter: Payer: Self-pay | Admitting: Vascular Surgery

## 2022-01-18 ENCOUNTER — Ambulatory Visit: Payer: Medicare HMO | Admitting: Vascular Surgery

## 2022-01-18 VITALS — BP 137/67 | HR 68 | Temp 97.6°F | Resp 16 | Ht 67.0 in | Wt 225.0 lb

## 2022-01-18 DIAGNOSIS — I739 Peripheral vascular disease, unspecified: Secondary | ICD-10-CM | POA: Diagnosis not present

## 2022-01-18 DIAGNOSIS — E11621 Type 2 diabetes mellitus with foot ulcer: Secondary | ICD-10-CM

## 2022-01-18 DIAGNOSIS — L97519 Non-pressure chronic ulcer of other part of right foot with unspecified severity: Secondary | ICD-10-CM | POA: Diagnosis not present

## 2022-01-18 NOTE — Progress Notes (Signed)
Patient name: Carla Stokes MRN: [id number] DOB: June 10, 1950 Sex: female  REASON FOR CONSULT: Chronic right great toe ulcer  HPI: Carla Stokes is a 71 y.o. female, with history of hypertension, hyperlipidemia, diabetes that presents for evaluation of chronic right great toe ulcer.  She states this has been present for about 3 months.  This happened when she went to a nail salon and they injured the soft tissue on her toe.  She is being followed by podiatry.  This has been nonhealing.  She had ABIs on 11/30/2021 that were noncompressible with normal triphasic waveforms at the ankle.  Her toe pressure was 112 on the right and 83 on the left.  Denies smoking.  No previous interventions from a vascular standpoint.  Past Medical History:  Diagnosis Date   Diabetes mellitus without complication (HCC)    High cholesterol    Hypertension     Past Surgical History:  Procedure Laterality Date   ABDOMINAL HYSTERECTOMY      History reviewed. No pertinent family history.  SOCIAL HISTORY: Social History   Socioeconomic History   Marital status: Divorced    Spouse name: Not on file   Number of children: Not on file   Years of education: Not on file   Highest education level: Not on file  Occupational History   Not on file  Tobacco Use   Smoking status: Never   Smokeless tobacco: Never  Substance and Sexual Activity   Alcohol use: Not Currently   Drug use: Never   Sexual activity: Not on file  Other Topics Concern   Not on file  Social History Narrative   Not on file   Social Determinants of Health   Financial Resource Strain: Not on file  Food Insecurity: Not on file  Transportation Needs: Not on file  Physical Activity: Not on file  Stress: Not on file  Social Connections: Not on file  Intimate Partner Violence: Not on file    Allergies  Allergen Reactions   Dapagliflozin Other (See Comments)    2017: yeast infection    Current Outpatient Medications   Medication Sig Dispense Refill   Alcohol Swabs (B-D SINGLE USE SWABS REGULAR) PADS      gentamicin cream (GARAMYCIN) 0.1 % Apply 1 Application topically 2 (two) times daily. 30 g 1   gentamicin ointment (GARAMYCIN) 0.1 % Apply 1 Application topically in the morning and at bedtime. 15 g 0   sulfamethoxazole-trimethoprim (BACTRIM DS) 800-160 MG tablet Take 1 tablet by mouth 2 (two) times daily. (Patient not taking: Reported on 01/18/2022) 20 tablet 0   No current facility-administered medications for this visit.    REVIEW OF SYSTEMS:  [X]  denotes positive finding, [ ]  denotes negative finding Cardiac  Comments:  Chest pain or chest pressure:    Shortness of breath upon exertion:    Short of breath when lying flat:    Irregular heart rhythm:        Vascular    Pain in calf, thigh, or hip brought on by ambulation:    Pain in feet at night that wakes you up from your sleep:     Blood clot in your veins:    Leg swelling:         Pulmonary    Oxygen at home:    Productive cough:     Wheezing:         Neurologic    Sudden weakness in arms or legs:  Sudden numbness in arms or legs:     Sudden onset of difficulty speaking or slurred speech:    Temporary loss of vision in one eye:     Problems with dizziness:         Gastrointestinal    Blood in stool:     Vomited blood:         Genitourinary    Burning when urinating:     Blood in urine:        Psychiatric    Major depression:         Hematologic    Bleeding problems:    Problems with blood clotting too easily:        Skin    Rashes or ulcers:        Constitutional    Fever or chills:      PHYSICAL EXAM: Vitals:   01/18/22 1008  BP: 137/67  Pulse: 68  Resp: 16  Temp: 97.6 F (36.4 C)  TempSrc: Temporal  SpO2: 97%  Weight: 225 lb (102.1 kg)  Height: 5\' 7"  (1.702 m)    GENERAL: The patient is a well-nourished female, in no acute distress. The vital signs are documented above. CARDIAC: There is a  regular rate and rhythm.  VASCULAR:  Bilateral femoral pulses palpable Bilateral popliteal pulses palpable No palpable pedal pulses Right great toe ulcer as pictured PULMONARY: No respiratory distress. ABDOMEN: Soft and non-tender. MUSCULOSKELETAL: There are no major deformities or cyanosis. NEUROLOGIC: No focal weakness or paresthesias are detected. PSYCHIATRIC: The patient has a normal affect.    DATA:   ABI's were noncompressible with triphasic waveforms at the ankle  Assessment/Plan:  71 year old female chronic diabetic ulcer to the right great toe.  Her ABIs were noncompressible and I cannot feel any palpable pedal pulses on exam.  I have recommended aortogram with lower extremity arteriogram with a focus on the right leg.  Discussed this will be to evaluate for inflow to ensure she is optimized for wound healing given the chronic nature of her wound and now non-healing for 3 months.  Risks benefits discussed and discussed transfemoral access.  Discussed this would be done under moderate sedation at [facility].  She wants to be scheduled for 02/03/2022.   14/08/2021, MD [facility] Office: [phone]

## 2022-01-25 ENCOUNTER — Other Ambulatory Visit: Payer: Self-pay

## 2022-01-25 DIAGNOSIS — I739 Peripheral vascular disease, unspecified: Secondary | ICD-10-CM

## 2022-01-25 DIAGNOSIS — E11621 Type 2 diabetes mellitus with foot ulcer: Secondary | ICD-10-CM

## 2022-01-28 ENCOUNTER — Telehealth: Payer: Self-pay | Admitting: *Deleted

## 2022-01-28 NOTE — Telephone Encounter (Signed)
Patient is calling to ask if she should keep her upcoming appointment on 12/06, is doing surgery w/ Vascular on 12/07

## 2022-01-28 NOTE — Telephone Encounter (Signed)
No need for appt until after surgery with vascular on 12/7 and MRI is completed. Please notify patient. -Dr. Logan Bores

## 2022-01-31 NOTE — Telephone Encounter (Signed)
Patient has cancelled appointment 12/06, will call back to schedule after her surgery(Dr Chestine Spore, V&V) on 12/07.

## 2022-02-02 ENCOUNTER — Ambulatory Visit: Payer: Medicare HMO | Admitting: Podiatry

## 2022-02-03 ENCOUNTER — Ambulatory Visit (HOSPITAL_COMMUNITY)
Admission: RE | Admit: 2022-02-03 | Discharge: 2022-02-03 | Disposition: A | Discharge status: Home / Self Care | Payer: Medicare HMO | Attending: Vascular Surgery | Admitting: Vascular Surgery

## 2022-02-03 ENCOUNTER — Encounter (HOSPITAL_COMMUNITY)
Admission: RE | Disposition: A | Discharge status: Home / Self Care | Payer: Self-pay | Source: Home / Self Care | Attending: Vascular Surgery

## 2022-02-03 DIAGNOSIS — I10 Essential (primary) hypertension: Secondary | ICD-10-CM | POA: Diagnosis not present

## 2022-02-03 DIAGNOSIS — E11621 Type 2 diabetes mellitus with foot ulcer: Secondary | ICD-10-CM

## 2022-02-03 DIAGNOSIS — I70235 Atherosclerosis of native arteries of right leg with ulceration of other part of foot: Secondary | ICD-10-CM | POA: Insufficient documentation

## 2022-02-03 DIAGNOSIS — E785 Hyperlipidemia, unspecified: Secondary | ICD-10-CM | POA: Insufficient documentation

## 2022-02-03 DIAGNOSIS — L97519 Non-pressure chronic ulcer of other part of right foot with unspecified severity: Secondary | ICD-10-CM | POA: Insufficient documentation

## 2022-02-03 DIAGNOSIS — I739 Peripheral vascular disease, unspecified: Secondary | ICD-10-CM

## 2022-02-03 DIAGNOSIS — E1151 Type 2 diabetes mellitus with diabetic peripheral angiopathy without gangrene: Secondary | ICD-10-CM | POA: Insufficient documentation

## 2022-02-03 HISTORY — PX: ABDOMINAL AORTOGRAM W/LOWER EXTREMITY: CATH118223

## 2022-02-03 LAB — GLUCOSE, CAPILLARY: Glucose-Capillary: 125 mg/dL — ABNORMAL HIGH (ref 70–99)

## 2022-02-03 LAB — POCT I-STAT, CHEM 8
BUN: 14 mg/dL (ref 8–23)
Calcium, Ion: 1.22 mmol/L (ref 1.15–1.40)
Chloride: 102 mmol/L (ref 98–111)
Creatinine, Ser: 0.8 mg/dL (ref 0.44–1.00)
Glucose, Bld: 185 mg/dL — ABNORMAL HIGH (ref 70–99)
HCT: 35 % — ABNORMAL LOW (ref 36.0–46.0)
Hemoglobin: 11.9 g/dL — ABNORMAL LOW (ref 12.0–15.0)
Potassium: 3.5 mmol/L (ref 3.5–5.1)
Sodium: 143 mmol/L (ref 135–145)
TCO2: 27 mmol/L (ref 22–32)

## 2022-02-03 SURGERY — ABDOMINAL AORTOGRAM W/LOWER EXTREMITY
Anesthesia: LOCAL

## 2022-02-03 MED ORDER — LIDOCAINE HCL (PF) 1 % IJ SOLN
INTRAMUSCULAR | Status: AC
  Filled 2022-02-03: qty 30

## 2022-02-03 MED ORDER — ACETAMINOPHEN 325 MG PO TABS: 650.0000 mg | ORAL_TABLET | ORAL | Status: DC | PRN

## 2022-02-03 MED ORDER — FENTANYL CITRATE (PF) 100 MCG/2ML IJ SOLN
INTRAMUSCULAR | Status: AC
  Filled 2022-02-03: qty 2

## 2022-02-03 MED ORDER — HEPARIN (PORCINE) IN NACL 1000-0.9 UT/500ML-% IV SOLN
INTRAVENOUS | Status: AC
  Filled 2022-02-03: qty 500

## 2022-02-03 MED ORDER — LIDOCAINE HCL (PF) 1 % IJ SOLN
INTRAMUSCULAR | Status: DC | PRN
  Administered 2022-02-03: 5 mL via SUBCUTANEOUS

## 2022-02-03 MED ORDER — HYDRALAZINE HCL 20 MG/ML IJ SOLN: 5.0000 mg | INTRAMUSCULAR | Status: DC | PRN

## 2022-02-03 MED ORDER — SODIUM CHLORIDE 0.9 % IV SOLN: INTRAVENOUS | Status: DC

## 2022-02-03 MED ORDER — SODIUM CHLORIDE 0.9 % IV SOLN: 250.0000 mL | INTRAVENOUS | Status: DC | PRN

## 2022-02-03 MED ORDER — SODIUM CHLORIDE 0.9% FLUSH: 3.0000 mL | INTRAVENOUS | Status: DC | PRN

## 2022-02-03 MED ORDER — MIDAZOLAM HCL 2 MG/2ML IJ SOLN
INTRAMUSCULAR | Status: AC
  Filled 2022-02-03: qty 2

## 2022-02-03 MED ORDER — HEPARIN (PORCINE) IN NACL 1000-0.9 UT/500ML-% IV SOLN
INTRAVENOUS | Status: DC | PRN
  Administered 2022-02-03 (×2): 500 mL

## 2022-02-03 MED ORDER — SODIUM CHLORIDE 0.9% FLUSH: 3.0000 mL | Freq: Two times a day (BID) | INTRAVENOUS | Status: DC

## 2022-02-03 MED ORDER — LABETALOL HCL 5 MG/ML IV SOLN: 10.0000 mg | INTRAVENOUS | Status: DC | PRN

## 2022-02-03 MED ORDER — MIDAZOLAM HCL 2 MG/2ML IJ SOLN
INTRAMUSCULAR | Status: DC | PRN
  Administered 2022-02-03: 1 mg via INTRAVENOUS

## 2022-02-03 MED ORDER — IODIXANOL 320 MG/ML IV SOLN
INTRAVENOUS | Status: DC | PRN
  Administered 2022-02-03: 75 mL via INTRA_ARTERIAL

## 2022-02-03 MED ORDER — FENTANYL CITRATE (PF) 100 MCG/2ML IJ SOLN
INTRAMUSCULAR | Status: DC | PRN
  Administered 2022-02-03: 25 ug via INTRAVENOUS

## 2022-02-03 MED ORDER — ONDANSETRON HCL 4 MG/2ML IJ SOLN: 4.0000 mg | Freq: Four times a day (QID) | INTRAMUSCULAR | Status: DC | PRN

## 2022-02-03 SURGICAL SUPPLY — 10 items
CATH OMNI FLUSH 5F 65CM (CATHETERS) IMPLANT
DEVICE CLOSURE MYNXGRIP 5F (Vascular Products) IMPLANT
KIT MICROPUNCTURE NIT STIFF (SHEATH) IMPLANT
KIT PV (KITS) ×1 IMPLANT
SHEATH PINNACLE 5F 10CM (SHEATH) IMPLANT
SHEATH PROBE COVER 6X72 (BAG) IMPLANT
SYR MEDRAD MARK V 150ML (SYRINGE) IMPLANT
TRANSDUCER W/STOPCOCK (MISCELLANEOUS) ×1 IMPLANT
TRAY PV CATH (CUSTOM PROCEDURE TRAY) ×1 IMPLANT
WIRE BENTSON .035X145CM (WIRE) IMPLANT

## 2022-02-03 NOTE — H&P (Signed)
History and Physical Interval Note:  02/03/2022 9:44 AM  Carla Stokes  has presented today for surgery, with the diagnosis of diabetic foot ulcer PAD.  The various methods of treatment have been discussed with the patient and family. After consideration of risks, benefits and other options for treatment, the patient has consented to  Procedure(s): ABDOMINAL AORTOGRAM W/LOWER EXTREMITY (N/A) as a surgical intervention.  The patient's history has been reviewed, patient examined, no change in status, stable for surgery.  I have reviewed the patient's chart and labs.  Questions were answered to the patient's satisfaction.     Cephus Shelling        Patient name: Carla Stokes         MRN: [id number]        DOB: Jul 10, 1950          Sex: female   REASON FOR CONSULT: Chronic right great toe ulcer   HPI: Carla Stokes is a 71 y.o. female, with history of hypertension, hyperlipidemia, diabetes that presents for evaluation of chronic right great toe ulcer.  She states this has been present for about 3 months.  This happened when she went to a nail salon and they injured the soft tissue on her toe.  She is being followed by podiatry.  This has been nonhealing.  She had ABIs on 11/30/2021 that were noncompressible with normal triphasic waveforms at the ankle.  Her toe pressure was 112 on the right and 83 on the left.  Denies smoking.  No previous interventions from a vascular standpoint.       Past Medical History:  Diagnosis Date   Diabetes mellitus without complication (HCC)     High cholesterol     Hypertension             Past Surgical History:  Procedure Laterality Date   ABDOMINAL HYSTERECTOMY          History reviewed. No pertinent family history.   SOCIAL HISTORY: Social History         Socioeconomic History   Marital status: Divorced      Spouse name: Not on file   Number of children: Not on file   Years of education: Not on file   Highest education  level: Not on file  Occupational History   Not on file  Tobacco Use   Smoking status: Never   Smokeless tobacco: Never  Substance and Sexual Activity   Alcohol use: Not Currently   Drug use: Never   Sexual activity: Not on file  Other Topics Concern   Not on file  Social History Narrative   Not on file    Social Determinants of Health    Financial Resource Strain: Not on file  Food Insecurity: Not on file  Transportation Needs: Not on file  Physical Activity: Not on file  Stress: Not on file  Social Connections: Not on file  Intimate Partner Violence: Not on file           Allergies  Allergen Reactions   Dapagliflozin Other (See Comments)      2017: yeast infection            Current Outpatient Medications  Medication Sig Dispense Refill   Alcohol Swabs (B-D SINGLE USE SWABS REGULAR) PADS         gentamicin cream (GARAMYCIN) 0.1 % Apply 1 Application topically 2 (two) times daily. 30 g 1   gentamicin ointment (GARAMYCIN) 0.1 % Apply 1 Application topically in  the morning and at bedtime. 15 g 0   sulfamethoxazole-trimethoprim (BACTRIM DS) 800-160 MG tablet Take 1 tablet by mouth 2 (two) times daily. (Patient not taking: Reported on 01/18/2022) 20 tablet 0    No current facility-administered medications for this visit.      REVIEW OF SYSTEMS:  [X]  denotes positive finding, [ ]  denotes negative finding Cardiac   Comments:  Chest pain or chest pressure:      Shortness of breath upon exertion:      Short of breath when lying flat:      Irregular heart rhythm:             Vascular      Pain in calf, thigh, or hip brought on by ambulation:      Pain in feet at night that wakes you up from your sleep:       Blood clot in your veins:      Leg swelling:              Pulmonary      Oxygen at home:      Productive cough:       Wheezing:              Neurologic      Sudden weakness in arms or legs:       Sudden numbness in arms or legs:       Sudden onset of  difficulty speaking or slurred speech:      Temporary loss of vision in one eye:       Problems with dizziness:              Gastrointestinal      Blood in stool:       Vomited blood:              Genitourinary      Burning when urinating:       Blood in urine:             Psychiatric      Major depression:              Hematologic      Bleeding problems:      Problems with blood clotting too easily:             Skin      Rashes or ulcers:             Constitutional      Fever or chills:          PHYSICAL EXAM:    Vitals:    01/18/22 1008  BP: 137/67  Pulse: 68  Resp: 16  Temp: 97.6 F (36.4 C)  TempSrc: Temporal  SpO2: 97%  Weight: 225 lb (102.1 kg)  Height: 5\' 7"  (1.702 m)      GENERAL: The patient is a well-nourished female, in no acute distress. The vital signs are documented above. CARDIAC: There is a regular rate and rhythm.  VASCULAR:  Bilateral femoral pulses palpable Bilateral popliteal pulses palpable No palpable pedal pulses Right great toe ulcer as pictured PULMONARY: No respiratory distress. ABDOMEN: Soft and non-tender. MUSCULOSKELETAL: There are no major deformities or cyanosis. NEUROLOGIC: No focal weakness or paresthesias are detected. PSYCHIATRIC: The patient has a normal affect.      DATA:    ABI's were noncompressible with triphasic waveforms at the ankle   Assessment/Plan:   71 year old female chronic diabetic ulcer to the right great toe.  Her ABIs were  noncompressible and I cannot feel any palpable pedal pulses on exam.  I have recommended aortogram with lower extremity arteriogram with a focus on the right leg.  Discussed this will be to evaluate for inflow to ensure she is optimized for wound healing given the chronic nature of her wound and now non-healing for 3 months.  Risks benefits discussed and discussed transfemoral access.  Discussed this would be done under moderate sedation at [facility].  She wants to be scheduled  for 02/03/2022.     Cephus Shelling, MD [facility] Office: [phone]

## 2022-02-03 NOTE — Op Note (Signed)
    Patient name: Carla Stokes MRN: [id number] DOB: [birth year] Sex: female  02/03/2022 Pre-operative Diagnosis: Diabetic ulcer right great toe Post-operative diagnosis:  Same Surgeon:  Cephus Shelling, MD Procedure Performed: 1.  Ultrasound-guided access left common femoral artery 2.  Aortogram with catheter selection of aorta 3.  Right lower extremity arteriogram with selection of second order branches 4.  Mynx closure of the left common femoral artery 5.  27 minutes of monitored moderate conscious sedation time  Indications: Patient is a 71 year old female with history of diabetes that was seen with a chronic right great toe ulcer.  She presents today for aortogram with lower extremity arteriogram with a focus on the right leg.  She is followed by podiatry.  Risks benefits discussed.  Findings:   Aortogram showed no flow-limiting stenosis in the aortoiliac segment.  Both renal arteries were widely patent.  Right lower extremity arteriogram showed a patent common femoral, profunda, SFA, above and below-knee popliteal artery with three-vessel runoff.  The peroneal is diminutive with dominant runoff being in the anterior tibial and posterior tibial arteries.  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  No intervention was performed.     Procedure:  The patient was identified in the holding area and taken to room 8.  The patient was then placed supine on the table and prepped and draped in the usual sterile fashion.  A time out was called.  Ultrasound was used to evaluate the left common femoral artery.  It was patent .  A digital ultrasound image was acquired.  A micropuncture needle was used to access the left common femoral artery under ultrasound guidance.  An 018 wire was advanced without resistance and a micropuncture sheath was placed.  The 018 wire was removed and a benson wire was placed.  The micropuncture sheath was exchanged for a 5 french sheath.  An omniflush  catheter was advanced over the wire to the level of L-1.  An abdominal angiogram was obtained.  Next, using the omniflush catheter and a benson wire, the aortic bifurcation was crossed and the catheter was placed into theright external iliac artery and right runoff was obtained.  Pertinent findings are noted above.  There was no stenosis that required intervention.  A left femoral access arteriogram was obtained on the left.  A mynx closure device was deployed.  She was taken to holding in stable condition.  Plan: Patient is optimized from a vascular surgery standpoint.  She has inline flow down the right lower extremity with three-vessel runoff.  She does have small vessel disease in the right foot.   Cephus Shelling, MD [facility] Office: [phone]

## 2022-02-04 ENCOUNTER — Encounter (HOSPITAL_COMMUNITY): Payer: Self-pay | Admitting: Vascular Surgery

## 2022-02-07 ENCOUNTER — Ambulatory Visit: Payer: Medicare HMO | Admitting: Podiatry

## 2022-02-09 ENCOUNTER — Ambulatory Visit: Payer: Medicare HMO | Admitting: Podiatry

## 2022-02-09 ENCOUNTER — Telehealth: Payer: Self-pay | Admitting: *Deleted

## 2022-02-09 ENCOUNTER — Encounter: Payer: Self-pay | Admitting: Podiatry

## 2022-02-09 VITALS — BP 154/84 | HR 59

## 2022-02-09 DIAGNOSIS — L97512 Non-pressure chronic ulcer of other part of right foot with fat layer exposed: Secondary | ICD-10-CM | POA: Diagnosis not present

## 2022-02-09 DIAGNOSIS — E0843 Diabetes mellitus due to underlying condition with diabetic autonomic (poly)neuropathy: Secondary | ICD-10-CM | POA: Diagnosis not present

## 2022-02-09 MED ORDER — CLINDAMYCIN HCL 300 MG PO CAPS: 300.0000 mg | ORAL_CAPSULE | Freq: Three times a day (TID) | ORAL | 1 refills | Status: DC

## 2022-02-09 NOTE — Progress Notes (Signed)
Chief Complaint  Patient presents with   Follow-up    Patient is here for right great toe ulcer follow-up.    Subjective:  71 y.o. female with PMHx of diabetes mellitus (A1c 7.9 on 06/16/2021) presenting today for follow-up evaluation of an ulcer to the right great toe.  Overall improvement.  Patient has noticed significant improvement over the past week.  She presents for further treatment and evaluation  Past Medical History:  Diagnosis Date   Diabetes mellitus without complication (HCC)    High cholesterol    Hypertension     Past Surgical History:  Procedure Laterality Date   ABDOMINAL AORTOGRAM W/LOWER EXTREMITY N/A 02/03/2022   Procedure: ABDOMINAL AORTOGRAM W/LOWER EXTREMITY;  Surgeon: Cephus Shelling, MD;  Location: [facility];  Service: Cardiovascular;  Laterality: N/A;   ABDOMINAL HYSTERECTOMY     Allergies  Allergen Reactions   Codeine     Other Reaction(s): Dizziness (intolerance)   Dapagliflozin Other (See Comments)    2017: yeast infection   Latex Itching    Objective/Physical Exam General: The patient is alert and oriented x3 in no acute distress.  Dermatology:  Significant improvement of the appearance of the wound.  Wound now measures approximately 0.5 x 0.5 x 0.1 cm.  Healthy granular wound base.  No purulence or drainage.  Overall significant improvement  Vascular: Erythema with edema noted right great toe VAS [place] LE ART SEG MULTI 11/30/2021 ABI Findings:  +---------+------------------+-----+-----------+--------+  Right   Rt Pressure (mmHg)IndexWaveform   Comment   +---------+------------------+-----+-----------+--------+  Brachial 137                                         +---------+------------------+-----+-----------+--------+  CFA                            triphasic            +---------+------------------+-----+-----------+--------+  Popliteal                      triphasic             +---------+------------------+-----+-----------+--------+  PTA     209               1.53 triphasic            +---------+------------------+-----+-----------+--------+  PERO    208               1.52 biphasic             +---------+------------------+-----+-----------+--------+  DP      182               1.33 multiphasic          +---------+------------------+-----+-----------+--------+  Great Toe112               0.82 Normal               +---------+------------------+-----+-----------+--------+   +---------+------------------+-----+-----------+-------+  Left    Lt Pressure (mmHg)IndexWaveform   Comment  +---------+------------------+-----+-----------+-------+  Brachial 131                                        +---------+------------------+-----+-----------+-------+  CFA  triphasic           +---------+------------------+-----+-----------+-------+  Popliteal                      multiphasic         +---------+------------------+-----+-----------+-------+  PTA     254               1.85 multiphasic         +---------+------------------+-----+-----------+-------+  PERO    254               1.85 multiphasic         +---------+------------------+-----+-----------+-------+  DP      254               1.85 triphasic           +---------+------------------+-----+-----------+-------+  Great Toe83                0.61 Abnormal            +---------+------------------+-----+-----------+-------+   +-------+-----------+-----------+------------+------------+  ABI/TBIToday's ABIToday's TBIPrevious ABIPrevious TBI  +-------+-----------+-----------+------------+------------+  Right [place]         0.82                                 +-------+-----------+-----------+------------+------------+  Left  [place]         0.61                                  +-------+-----------+-----------+------------+------------+   Summary:  Right: Resting right ankle-brachial index indicates noncompressible right  lower extremity arteries. The right toe-brachial index is normal. Normal  multiphasic waveforms Left: Resting left ankle-brachial index indicates noncompressible left  lower extremity arteries. The left toe-brachial index is abnormal.   Abdominal aortogram w/ Lower extremity 02/03/2022.  Dr. Sherald Hess, vein and vascular Plan: Patient is optimized from a vascular surgery standpoint.  She has inline flow down the right lower extremity with three-vessel runoff.  She does have small vessel disease in the right foot.   Neurological: Light touch and protective threshold diminished bilaterally.   Musculoskeletal Exam: No pedal deformity.  No prior amputations  Assessment: 1.  Ulcer right great toe secondary to diabetes mellitus w/ concern for underlying abscess/osteomyelitis 2. diabetes mellitus w/ peripheral neuropathy   Plan of Care:  1. Patient was evaluated. 2. medically necessary excisional debridement including subcutaneous tissue was performed using a tissue nipper and a chisel blade. Excisional debridement of all the necrotic nonviable tissue down to healthy bleeding viable tissue was performed with post-debridement measurements same as pre-. 3.  Continue weightbearing in the postsurgical shoe 4.  MRI pending 5.  Prescription for clindamycin 300 mg 3 times daily #30 6.  Betadine ointment provided to apply daily with a Band-Aid  7.  Return to clinic 3 weeks for follow-up x-ray  Felecia Shelling, [name] [facility]  Dr. Felecia Shelling, [name]    [address] [place], [place] [address]                Office [phone]  Fax [phone]

## 2022-02-09 NOTE — Telephone Encounter (Signed)
Patient is calling for update on MRI status, was given [facility] number to contact for scheduling.

## 2022-02-09 NOTE — Addendum Note (Signed)
Addended by: Felecia Shelling on: 02/09/2022 05:28 PM   Modules accepted: Orders

## 2022-02-09 NOTE — Telephone Encounter (Signed)
Patient is calling for the status of a medication that was supposed to be sent to pharmacy,. She wanted to let the physician know that she has scheduled her MRI .

## 2022-02-10 NOTE — Telephone Encounter (Signed)
Patient updated thru voice message that medication has been sent.

## 2022-02-10 NOTE — Telephone Encounter (Signed)
The prescription for clindamycin was sent yesterday. - Dr. Logan Bores

## 2022-02-18 ENCOUNTER — Encounter: Payer: Self-pay | Admitting: Podiatry

## 2022-02-23 ENCOUNTER — Ambulatory Visit
Admission: RE | Admit: 2022-02-23 | Discharge: 2022-02-23 | Disposition: A | Discharge status: Home / Self Care | Payer: Medicare HMO | Source: Ambulatory Visit | Attending: Podiatry | Admitting: Podiatry

## 2022-02-23 DIAGNOSIS — E0843 Diabetes mellitus due to underlying condition with diabetic autonomic (poly)neuropathy: Secondary | ICD-10-CM

## 2022-02-23 DIAGNOSIS — L97512 Non-pressure chronic ulcer of other part of right foot with fat layer exposed: Secondary | ICD-10-CM

## 2022-03-02 ENCOUNTER — Ambulatory Visit: Payer: Medicare HMO | Admitting: Podiatry

## 2022-03-02 DIAGNOSIS — L97512 Non-pressure chronic ulcer of other part of right foot with fat layer exposed: Secondary | ICD-10-CM | POA: Diagnosis not present

## 2022-03-02 DIAGNOSIS — M869 Osteomyelitis, unspecified: Secondary | ICD-10-CM

## 2022-03-02 DIAGNOSIS — E0843 Diabetes mellitus due to underlying condition with diabetic autonomic (poly)neuropathy: Secondary | ICD-10-CM

## 2022-03-02 NOTE — Progress Notes (Signed)
Chief Complaint  Patient presents with   Foot Problem    Follow up on ulcer, right great hallux, MRI results     Subjective:  72 y.o. female with PMHx of diabetes mellitus (A1c 7.2 on 12/21/2021) presenting today for follow-up evaluation of an ulcer to the right great toe.  Ulcer is stable.  Last visit MRI ordered.  Presenting to review the MRI and discuss further treatment options  Past Medical History:  Diagnosis Date   Diabetes mellitus without complication ([place])    High cholesterol    Hypertension     Past Surgical History:  Procedure Laterality Date   ABDOMINAL AORTOGRAM W/LOWER EXTREMITY N/A 02/03/2022   Procedure: ABDOMINAL AORTOGRAM W/LOWER EXTREMITY;  Surgeon: Marty Heck, MD;  Location: [facility];  Service: Cardiovascular;  Laterality: N/A;   ABDOMINAL HYSTERECTOMY     Allergies  Allergen Reactions   Codeine     Other Reaction(s): Dizziness (intolerance)   Dapagliflozin Other (See Comments)    2017: yeast infection   Latex Itching    02/09/2022   03/02/2022  Objective/Physical Exam General: The patient is alert and oriented x3 in no acute distress.  Dermatology:  Ulcer appears stable.  2 separate small wounds measuring approximately 0.5 x 0.5 x 0.1 cm.  Healthy granular wound base.  No purulence or drainage.  Wounds do not probe to bone.  Overall significant improvement  Vascular: Erythema with edema noted right great toe VAS [place] LE ART SEG MULTI 11/30/2021 ABI Findings:  +---------+------------------+-----+-----------+--------+  Right   Rt Pressure (mmHg)IndexWaveform   Comment   +---------+------------------+-----+-----------+--------+  Brachial 137                                         +---------+------------------+-----+-----------+--------+  CFA                            triphasic            +---------+------------------+-----+-----------+--------+  Popliteal                      triphasic             +---------+------------------+-----+-----------+--------+  PTA     209               1.53 triphasic            +---------+------------------+-----+-----------+--------+  PERO    208               1.52 biphasic             +---------+------------------+-----+-----------+--------+  DP      182               1.33 multiphasic          +---------+------------------+-----+-----------+--------+  Great Toe112               0.82 Normal               +---------+------------------+-----+-----------+--------+   +---------+------------------+-----+-----------+-------+  Left    Lt Pressure (mmHg)IndexWaveform   Comment  +---------+------------------+-----+-----------+-------+  Brachial 131                                        +---------+------------------+-----+-----------+-------+  CFA  triphasic           +---------+------------------+-----+-----------+-------+  Popliteal                      multiphasic         +---------+------------------+-----+-----------+-------+  PTA     254               1.85 multiphasic         +---------+------------------+-----+-----------+-------+  PERO    254               1.85 multiphasic         +---------+------------------+-----+-----------+-------+  DP      254               1.85 triphasic           +---------+------------------+-----+-----------+-------+  Great Toe83                0.61 Abnormal            +---------+------------------+-----+-----------+-------+   +-------+-----------+-----------+------------+------------+  ABI/TBIToday's ABIToday's TBIPrevious ABIPrevious TBI  +-------+-----------+-----------+------------+------------+  Right [place]         0.82                                 +-------+-----------+-----------+------------+------------+  Left  [place]         0.61                                  +-------+-----------+-----------+------------+------------+   Summary:  Right: Resting right ankle-brachial index indicates noncompressible right  lower extremity arteries. The right toe-brachial index is normal. Normal  multiphasic waveforms Left: Resting left ankle-brachial index indicates noncompressible left  lower extremity arteries. The left toe-brachial index is abnormal.   Abdominal aortogram w/ Lower extremity 02/03/2022.  Dr. Monica Martinez, vein and vascular Plan: Patient is optimized from a vascular surgery standpoint.  She has inline flow down the right lower extremity with three-vessel runoff.  She does have small vessel disease in the right foot.   Neurological: Light touch and protective threshold diminished bilaterally.   Musculoskeletal Exam: No pedal deformity.  No prior amputations  MR TOES RIGHT WO CONTRAST 02/23/2022 IMPRESSION: Marrow edema in the distal 1.7 cm the proximal phalanx of the great toe is new since the prior examination and consistent with osteomyelitis. Milder degree of edema in the base of the proximal phalanx of the great toe is likely due to stress change.   Skin wounds on the great toe without underlying abscess.  Assessment: 1.  Ulcer right great toe secondary to diabetes mellitus w/ concern for underlying abscess/osteomyelitis 2. diabetes mellitus w/ peripheral neuropathy   Plan of Care:  1. Patient was evaluated. 2. medically necessary excisional debridement including subcutaneous tissue was performed using a tissue nipper and a chisel blade. Excisional debridement of all the necrotic nonviable tissue down to healthy bleeding viable tissue was performed with post-debridement measurements same as pre-. 3.  Continue weightbearing in the postsurgical shoe 4.  MRI findings concerning for osteomyelitis of the proximal phalanx of the great toe.  Today we are going to plan for bone biopsy of the distal portion of the right great toe proximal  phalanx.  Risk benefits advantages and disadvantages were discussed today as well as the severity of the osteomyelitis and possibility of amputation of the toe.  Patient understands  and would like to proceed with the bone biopsy to see if we can save/salvage the toe versus amputation.   5.  Referral to infectious disease for management of the osteomyelitis.  There is specialty and input is always greatly appreciated 6.  Return to clinic 1 week postop  Edrick Kins, [name] [facility]  Dr. Edrick Kins, [name]    [address], [place] [address]                Office [phone]  Fax [phone]

## 2022-03-02 NOTE — H&P (View-Only) (Signed)
Chief Complaint  Patient presents with   Foot Problem    Follow up on ulcer, right great hallux, MRI results     Subjective:  72 y.o. female with PMHx of diabetes mellitus (A1c 7.2 on 12/21/2021) presenting today for follow-up evaluation of an ulcer to the right great toe.  Ulcer is stable.  Last visit MRI ordered.  Presenting to review the MRI and discuss further treatment options  Past Medical History:  Diagnosis Date   Diabetes mellitus without complication ([place])    High cholesterol    Hypertension     Past Surgical History:  Procedure Laterality Date   ABDOMINAL AORTOGRAM W/LOWER EXTREMITY N/A 02/03/2022   Procedure: ABDOMINAL AORTOGRAM W/LOWER EXTREMITY;  Surgeon: Marty Heck, MD;  Location: [facility];  Service: Cardiovascular;  Laterality: N/A;   ABDOMINAL HYSTERECTOMY     Allergies  Allergen Reactions   Codeine     Other Reaction(s): Dizziness (intolerance)   Dapagliflozin Other (See Comments)    2017: yeast infection   Latex Itching    02/09/2022   03/02/2022  Objective/Physical Exam General: The patient is alert and oriented x3 in no acute distress.  Dermatology:  Ulcer appears stable.  2 separate small wounds measuring approximately 0.5 x 0.5 x 0.1 cm.  Healthy granular wound base.  No purulence or drainage.  Wounds do not probe to bone.  Overall significant improvement  Vascular: Erythema with edema noted right great toe VAS [place] LE ART SEG MULTI 11/30/2021 ABI Findings:  +---------+------------------+-----+-----------+--------+  Right   Rt Pressure (mmHg)IndexWaveform   Comment   +---------+------------------+-----+-----------+--------+  Brachial 137                                         +---------+------------------+-----+-----------+--------+  CFA                            triphasic            +---------+------------------+-----+-----------+--------+  Popliteal                      triphasic             +---------+------------------+-----+-----------+--------+  PTA     209               1.53 triphasic            +---------+------------------+-----+-----------+--------+  PERO    208               1.52 biphasic             +---------+------------------+-----+-----------+--------+  DP      182               1.33 multiphasic          +---------+------------------+-----+-----------+--------+  Great Toe112               0.82 Normal               +---------+------------------+-----+-----------+--------+   +---------+------------------+-----+-----------+-------+  Left    Lt Pressure (mmHg)IndexWaveform   Comment  +---------+------------------+-----+-----------+-------+  Brachial 131                                        +---------+------------------+-----+-----------+-------+  CFA  triphasic           +---------+------------------+-----+-----------+-------+  Popliteal                      multiphasic         +---------+------------------+-----+-----------+-------+  PTA     254               1.85 multiphasic         +---------+------------------+-----+-----------+-------+  PERO    254               1.85 multiphasic         +---------+------------------+-----+-----------+-------+  DP      254               1.85 triphasic           +---------+------------------+-----+-----------+-------+  Great Toe83                0.61 Abnormal            +---------+------------------+-----+-----------+-------+   +-------+-----------+-----------+------------+------------+  ABI/TBIToday's ABIToday's TBIPrevious ABIPrevious TBI  +-------+-----------+-----------+------------+------------+  Right [place]         0.82                                 +-------+-----------+-----------+------------+------------+  Left  [place]         0.61                                  +-------+-----------+-----------+------------+------------+   Summary:  Right: Resting right ankle-brachial index indicates noncompressible right  lower extremity arteries. The right toe-brachial index is normal. Normal  multiphasic waveforms Left: Resting left ankle-brachial index indicates noncompressible left  lower extremity arteries. The left toe-brachial index is abnormal.   Abdominal aortogram w/ Lower extremity 02/03/2022.  Dr. Monica Martinez, vein and vascular Plan: Patient is optimized from a vascular surgery standpoint.  She has inline flow down the right lower extremity with three-vessel runoff.  She does have small vessel disease in the right foot.   Neurological: Light touch and protective threshold diminished bilaterally.   Musculoskeletal Exam: No pedal deformity.  No prior amputations  MR TOES RIGHT WO CONTRAST 02/23/2022 IMPRESSION: Marrow edema in the distal 1.7 cm the proximal phalanx of the great toe is new since the prior examination and consistent with osteomyelitis. Milder degree of edema in the base of the proximal phalanx of the great toe is likely due to stress change.   Skin wounds on the great toe without underlying abscess.  Assessment: 1.  Ulcer right great toe secondary to diabetes mellitus w/ concern for underlying abscess/osteomyelitis 2. diabetes mellitus w/ peripheral neuropathy   Plan of Care:  1. Patient was evaluated. 2. medically necessary excisional debridement including subcutaneous tissue was performed using a tissue nipper and a chisel blade. Excisional debridement of all the necrotic nonviable tissue down to healthy bleeding viable tissue was performed with post-debridement measurements same as pre-. 3.  Continue weightbearing in the postsurgical shoe 4.  MRI findings concerning for osteomyelitis of the proximal phalanx of the great toe.  Today we are going to plan for bone biopsy of the distal portion of the right great toe proximal  phalanx.  Risk benefits advantages and disadvantages were discussed today as well as the severity of the osteomyelitis and possibility of amputation of the toe.  Patient understands  and would like to proceed with the bone biopsy to see if we can save/salvage the toe versus amputation.   5.  Referral to infectious disease for management of the osteomyelitis.  There is specialty and input is always greatly appreciated 6.  Return to clinic 1 week postop  Edrick Kins, [name] [facility]  Dr. Edrick Kins, [name]    [address], [place] [address]                Office [phone]  Fax [phone]

## 2022-03-03 ENCOUNTER — Telehealth: Payer: Self-pay | Admitting: Podiatry

## 2022-03-03 NOTE — Progress Notes (Signed)
Sent message, via epic in basket, requesting orders in epic from [facility], [facility] exercise. And also left a voicemail with Gay Filler.

## 2022-03-03 NOTE — Telephone Encounter (Signed)
DOS: 03/10/2021  [employer]  Bone Biopsy Hallux Toe Rt (20220) Debridement of Ulcer Hallux Toe Rt (63016)  Prior authorization is not required for CPT code 20220 per [employer], [employer]film[employer]video.  Call Reference #: [id number]  Prior authorization is not required for CPT code 11043 per Nunzio Cory.  Call Reference #: [id number]

## 2022-03-04 NOTE — Patient Instructions (Signed)
DUE TO COVID-19 ONLY TWO VISITORS  (aged 70 and older)  ARE ALLOWED TO COME WITH YOU AND STAY IN THE WAITING ROOM ONLY DURING PRE OP AND PROCEDURE.   **NO VISITORS ARE ALLOWED IN THE SHORT STAY AREA OR RECOVERY ROOM!!**  IF YOU WILL BE ADMITTED INTO THE HOSPITAL YOU ARE ALLOWED ONLY FOUR SUPPORT PEOPLE DURING VISITATION HOURS ONLY (7 AM -8PM)   The support person(s) must pass our screening, gel in and out, and wear a mask at all times, including in the patient's room. Patients must also wear a mask when staff or their support person are in the room. Visitors GUEST BADGE MUST BE WORN VISIBLY  One adult visitor may remain with you overnight and MUST be in the room by 8 P.M.     Your procedure is scheduled on: 03/10/22   Report to [facility] Main Entrance    Report to admitting at : 10:45 AM   Call this number if you have problems the morning of surgery [phone]   Do not eat food :After Midnight.   After Midnight you may have the following liquids until : 10:00 AM DAY OF SURGERY  Water Black Coffee (sugar ok, NO MILK/CREAM OR CREAMERS)  Tea (sugar ok, NO MILK/CREAM OR CREAMERS) regular and decaf                             Plain Jell-O (NO RED)                                           Fruit ices (not with fruit pulp, NO RED)                                     Popsicles (NO RED)                                                                  Juice: apple, WHITE grape, WHITE cranberry Sports drinks like Gatorade (NO RED)    Oral Hygiene is also important to reduce your risk of infection.                                    Remember - BRUSH YOUR TEETH THE MORNING OF SURGERY WITH YOUR REGULAR TOOTHPASTE  DENTURES WILL BE REMOVED PRIOR TO SURGERY PLEASE DO NOT APPLY "Poly grip" OR ADHESIVES!!!   Do NOT smoke after Midnight   Take these medicines the morning of surgery with A SIP OF WATER: amlodipine..Tylenol as needed.  How to Manage Your Diabetes Before and After  Surgery  Why is it important to control my blood sugar before and after surgery? Improving blood sugar levels before and after surgery helps healing and can limit problems. A way of improving blood sugar control is eating a healthy diet by:  Eating less sugar and carbohydrates  Increasing activity/exercise  Talking with your doctor about reaching your blood sugar goals High blood sugars (greater than 180  mg/dL) can raise your risk of infections and slow your recovery, so you will need to focus on controlling your diabetes during the weeks before surgery. Make sure that the doctor who takes care of your diabetes knows about your planned surgery including the date and location.  How do I manage my blood sugar before surgery? Check your blood sugar at least 4 times a day, starting 2 days before surgery, to make sure that the level is not too high or low. Check your blood sugar the morning of your surgery when you wake up and every 2 hours until you get to the Short Stay unit. If your blood sugar is less than 70 mg/dL, you will need to treat for low blood sugar: Do not take insulin. Treat a low blood sugar (less than 70 mg/dL) with  cup of clear juice (cranberry or apple), 4 glucose tablets, OR glucose gel. Recheck blood sugar in 15 minutes after treatment (to make sure it is greater than 70 mg/dL). If your blood sugar is not greater than 70 mg/dL on recheck, call [phone] for further instructions. Report your blood sugar to the short stay nurse when you get to Short Stay.  If you are admitted to the hospital after surgery: Your blood sugar will be checked by the staff and you will probably be given insulin after surgery (instead of oral diabetes medicines) to make sure you have good blood sugar levels. The goal for blood sugar control after surgery is 80-180 mg/dL.   WHAT DO I DO ABOUT MY DIABETES MEDICATION?  Do not take oral diabetes medicines (pills) the morning of surgery.  THE  NIGHT BEFORE SURGERY, take ONLY half of toujeo insulin. Take metformin as usual.     THE MORNING OF SURGERY, DO NOT TAKE ANY ORAL DIABETIC MEDICATIONS DAY OF YOUR SURGERY  DO NOT TAKE THE FOLLOWING 7 DAYS PRIOR TO SURGERY: Ozempic, Wegovy, Rybelsus (Semaglutide), Byetta (exenatide), Bydureon (exenatide ER), Victoza, Saxenda (liraglutide), or Trulicity (dulaglutide) Mounjaro (Tirzepatide) Adlyxin (Lixisenatide), Polyethylene Glycol Loxenatide.  Bring CPAP mask and tubing day of surgery.                              You may not have any metal on your body including hair pins, jewelry, and body piercing             Do not wear make-up, lotions, powders, perfumes/cologne, or deodorant  Do not wear nail polish including gel and S&S, artificial/acrylic nails, or any other type of covering on natural nails including finger and toenails. If you have artificial nails, gel coating, etc. that needs to be removed by a nail salon please have this removed prior to surgery or surgery may need to be canceled/ delayed if the surgeon/ anesthesia feels like they are unable to be safely monitored.   Do not shave  48 hours prior to surgery.   Do not bring valuables to the hospital. [facility] IS NOT             RESPONSIBLE   FOR VALUABLES.   Contacts, glasses, or bridgework may not be worn into surgery.   Bring small overnight bag day of surgery.   DO NOT BRING YOUR HOME MEDICATIONS TO THE HOSPITAL. PHARMACY WILL DISPENSE MEDICATIONS LISTED ON YOUR MEDICATION LIST TO YOU DURING YOUR ADMISSION IN THE HOSPITAL!    Patients discharged on the day of surgery will not be allowed to drive home.  Someone NEEDS to stay with you for the first 24 hours after anesthesia.   Special Instructions: Bring a copy of your healthcare power of attorney and living will documents         the day of surgery if you haven't scanned them before.              Please read over the following fact sheets you were given: IF YOU HAVE  QUESTIONS ABOUT YOUR PRE-OP INSTRUCTIONS PLEASE CALL [phone]    [facility] - Preparing for Surgery Before surgery, you can play an important role.  Because skin is not sterile, your skin needs to be as free of germs as possible.  You can reduce the number of germs on your skin by washing with CHG (chlorahexidine gluconate) soap before surgery.  CHG is an antiseptic cleaner which kills germs and bonds with the skin to continue killing germs even after washing. Please DO NOT use if you have an allergy to CHG or antibacterial soaps.  If your skin becomes reddened/irritated stop using the CHG and inform your nurse when you arrive at Short Stay. Do not shave (including legs and underarms) for at least 48 hours prior to the first CHG shower.  You may shave your face/neck. Please follow these instructions carefully:  1.  Shower with CHG Soap the night before surgery and the  morning of Surgery.  2.  If you choose to wash your hair, wash your hair first as usual with your  normal  shampoo.  3.  After you shampoo, rinse your hair and body thoroughly to remove the  shampoo.                           4.  Use CHG as you would any other liquid soap.  You can apply chg directly  to the skin and wash                       Gently with a scrungie or clean washcloth.  5.  Apply the CHG Soap to your body ONLY FROM THE NECK DOWN.   Do not use on face/ open                           Wound or open sores. Avoid contact with eyes, ears mouth and genitals (private parts).                       Wash face,  Genitals (private parts) with your normal soap.             6.  Wash thoroughly, paying special attention to the area where your surgery  will be performed.  7.  Thoroughly rinse your body with warm water from the neck down.  8.  DO NOT shower/wash with your normal soap after using and rinsing off  the CHG Soap.                9.  Pat yourself dry with a clean towel.            10.  Wear clean pajamas.             11.  Place clean sheets on your bed the night of your first shower and do not  sleep with pets. Day of Surgery : Do not apply any lotions/deodorants the morning of surgery.  Please  wear clean clothes to the hospital/surgery center.  FAILURE TO FOLLOW THESE INSTRUCTIONS MAY RESULT IN THE CANCELLATION OF YOUR SURGERY PATIENT SIGNATURE_________________________________  NURSE SIGNATURE__________________________________  ________________________________________________________________________

## 2022-03-07 ENCOUNTER — Other Ambulatory Visit: Payer: Self-pay

## 2022-03-07 ENCOUNTER — Encounter (HOSPITAL_COMMUNITY)
Admission: RE | Admit: 2022-03-07 | Discharge: 2022-03-07 | Disposition: A | Discharge status: Home / Self Care | Payer: Medicare HMO | Source: Ambulatory Visit | Attending: Podiatry | Admitting: Podiatry

## 2022-03-07 ENCOUNTER — Encounter (HOSPITAL_COMMUNITY): Payer: Self-pay

## 2022-03-07 VITALS — BP 164/71 | HR 54 | Temp 98.1°F | Ht 67.0 in | Wt 232.0 lb

## 2022-03-07 DIAGNOSIS — L97519 Non-pressure chronic ulcer of other part of right foot with unspecified severity: Secondary | ICD-10-CM | POA: Diagnosis not present

## 2022-03-07 DIAGNOSIS — E11621 Type 2 diabetes mellitus with foot ulcer: Secondary | ICD-10-CM | POA: Diagnosis not present

## 2022-03-07 DIAGNOSIS — Z01812 Encounter for preprocedural laboratory examination: Secondary | ICD-10-CM | POA: Insufficient documentation

## 2022-03-07 HISTORY — DX: Unspecified osteoarthritis, unspecified site: M19.90

## 2022-03-07 LAB — BASIC METABOLIC PANEL
Anion gap: 8 (ref 5–15)
BUN: 15 mg/dL (ref 8–23)
CO2: 30 mmol/L (ref 22–32)
Calcium: 9.5 mg/dL (ref 8.9–10.3)
Chloride: 105 mmol/L (ref 98–111)
Creatinine, Ser: 0.88 mg/dL (ref 0.44–1.00)
GFR, Estimated: >60 mL/min (ref >60)
Glucose, Bld: 142 mg/dL — ABNORMAL HIGH (ref 70–99)
Potassium: 3.6 mmol/L (ref 3.5–5.1)
Sodium: 143 mmol/L (ref 135–145)

## 2022-03-07 LAB — GLUCOSE, CAPILLARY: Glucose-Capillary: 153 mg/dL — ABNORMAL HIGH (ref 70–99)

## 2022-03-07 LAB — CBC
HCT: 37.8 % (ref 36.0–46.0)
Hemoglobin: 12.3 g/dL (ref 12.0–15.0)
MCH: 25.9 pg — ABNORMAL LOW (ref 26.0–34.0)
MCHC: 32.5 g/dL (ref 30.0–36.0)
MCV: 79.7 fL — ABNORMAL LOW (ref 80.0–100.0)
Platelets: 181 10*3/uL (ref 150–400)
RBC: 4.74 MIL/uL (ref 3.87–5.11)
RDW: 14.1 % (ref 11.5–15.5)
WBC: 6.2 10*3/uL (ref 4.0–10.5)
nRBC: 0 % (ref 0.0–0.2)

## 2022-03-07 NOTE — Progress Notes (Signed)
Pt. Needs H & P for her surgery.

## 2022-03-07 NOTE — Progress Notes (Addendum)
For Short Stay: [facility] appointment date:  Bowel Prep reminder:   For Anesthesia: PCP - Dr. Teressa Lower. Cardiologist -   Chest x-ray -  EKG - 02/03/22 Stress Test -  ECHO -  Cardiac Cath -  Pacemaker/ICD device last checked: Pacemaker orders received: Device Rep notified:  Spinal Cord Stimulator:  Sleep Study - Yes CPAP - NO  Fasting Blood Sugar - 130's - 150's Checks Blood Sugar __1___ times a day Date and result of last Hgb A1c- 7.2: 12/21/21  Last dose of GLP1 agonist- Trulicity.: Last dose: 09/05/00 GLP1 instructions: To hold next dose.  Last dose of SGLT-2 inhibitors-  SGLT-2 instructions:   Blood Thinner Instructions: Aspirin Instructions: Last Dose:  Activity level: Can go up a flight of stairs and activities of daily living without stopping and without chest pain and/or shortness of breath   Able to exercise without chest pain and/or shortness of breath    Anesthesia review: Hx: DIA,HTN  Patient denies shortness of breath, fever, cough and chest pain at PAT appointment   Patient verbalized understanding of instructions that were given to them at the PAT appointment. Patient was also instructed that they will need to review over the PAT instructions again at home before surgery.

## 2022-03-10 ENCOUNTER — Encounter: Payer: Self-pay | Admitting: Podiatry

## 2022-03-10 ENCOUNTER — Encounter (HOSPITAL_COMMUNITY)
Admission: RE | Disposition: A | Discharge status: Home / Self Care | Payer: Self-pay | Source: Home / Self Care | Attending: Podiatry

## 2022-03-10 ENCOUNTER — Ambulatory Visit (HOSPITAL_BASED_OUTPATIENT_CLINIC_OR_DEPARTMENT_OTHER): Payer: Medicare HMO | Admitting: Certified Registered Nurse Anesthetist

## 2022-03-10 ENCOUNTER — Ambulatory Visit (HOSPITAL_COMMUNITY): Payer: Medicare HMO | Admitting: Certified Registered Nurse Anesthetist

## 2022-03-10 ENCOUNTER — Other Ambulatory Visit: Payer: Self-pay

## 2022-03-10 ENCOUNTER — Ambulatory Visit (HOSPITAL_COMMUNITY)
Admission: RE | Admit: 2022-03-10 | Discharge: 2022-03-10 | Disposition: A | Discharge status: Home / Self Care | Payer: Medicare HMO | Attending: Podiatry | Admitting: Podiatry

## 2022-03-10 ENCOUNTER — Encounter (HOSPITAL_COMMUNITY): Payer: Self-pay | Admitting: Podiatry

## 2022-03-10 DIAGNOSIS — L97519 Non-pressure chronic ulcer of other part of right foot with unspecified severity: Secondary | ICD-10-CM | POA: Insufficient documentation

## 2022-03-10 DIAGNOSIS — E785 Hyperlipidemia, unspecified: Secondary | ICD-10-CM | POA: Diagnosis not present

## 2022-03-10 DIAGNOSIS — I10 Essential (primary) hypertension: Secondary | ICD-10-CM

## 2022-03-10 DIAGNOSIS — Z794 Long term (current) use of insulin: Secondary | ICD-10-CM

## 2022-03-10 DIAGNOSIS — M1990 Unspecified osteoarthritis, unspecified site: Secondary | ICD-10-CM | POA: Diagnosis not present

## 2022-03-10 DIAGNOSIS — Z79899 Other long term (current) drug therapy: Secondary | ICD-10-CM | POA: Insufficient documentation

## 2022-03-10 DIAGNOSIS — E11621 Type 2 diabetes mellitus with foot ulcer: Secondary | ICD-10-CM | POA: Diagnosis not present

## 2022-03-10 DIAGNOSIS — M869 Osteomyelitis, unspecified: Secondary | ICD-10-CM

## 2022-03-10 DIAGNOSIS — E1169 Type 2 diabetes mellitus with other specified complication: Secondary | ICD-10-CM | POA: Diagnosis not present

## 2022-03-10 DIAGNOSIS — L97512 Non-pressure chronic ulcer of other part of right foot with fat layer exposed: Secondary | ICD-10-CM

## 2022-03-10 DIAGNOSIS — E1151 Type 2 diabetes mellitus with diabetic peripheral angiopathy without gangrene: Secondary | ICD-10-CM | POA: Insufficient documentation

## 2022-03-10 HISTORY — PX: WOUND DEBRIDEMENT: SHX247

## 2022-03-10 HISTORY — PX: BONE BIOPSY: SHX375

## 2022-03-10 LAB — GLUCOSE, CAPILLARY
Glucose-Capillary: 191 mg/dL — ABNORMAL HIGH (ref 70–99)
Glucose-Capillary: 133 mg/dL — ABNORMAL HIGH (ref 70–99)

## 2022-03-10 SURGERY — BIOPSY, BONE
Anesthesia: Monitor Anesthesia Care | Site: Fifth Toe | Laterality: Right

## 2022-03-10 MED ORDER — CHLORHEXIDINE GLUCONATE 0.12 % MT SOLN
15.0000 mL | Freq: Once | OROMUCOSAL | Status: AC
  Administered 2022-03-10: 15 mL via OROMUCOSAL

## 2022-03-10 MED ORDER — OXYCODONE HCL 5 MG/5ML PO SOLN: 5.0000 mg | Freq: Once | ORAL | Status: DC | PRN

## 2022-03-10 MED ORDER — LIDOCAINE 1 % OPTIME INJ - NO CHARGE
INTRAMUSCULAR | Status: DC | PRN
  Administered 2022-03-10: 5 mL

## 2022-03-10 MED ORDER — LIDOCAINE HCL 1 % IJ SOLN
INTRAMUSCULAR | Status: AC
  Filled 2022-03-10: qty 20

## 2022-03-10 MED ORDER — FENTANYL CITRATE (PF) 100 MCG/2ML IJ SOLN
INTRAMUSCULAR | Status: DC | PRN
  Administered 2022-03-10 (×2): 50 ug via INTRAVENOUS

## 2022-03-10 MED ORDER — ONDANSETRON HCL 4 MG/2ML IJ SOLN
INTRAMUSCULAR | Status: DC | PRN
  Administered 2022-03-10: 4 mg via INTRAVENOUS

## 2022-03-10 MED ORDER — PROMETHAZINE HCL 25 MG/ML IJ SOLN: 6.2500 mg | INTRAMUSCULAR | Status: DC | PRN

## 2022-03-10 MED ORDER — BUPIVACAINE HCL (PF) 0.5 % IJ SOLN
INTRAMUSCULAR | Status: DC | PRN
  Administered 2022-03-10: 5 mL

## 2022-03-10 MED ORDER — OXYCODONE HCL 5 MG PO TABS: 5.0000 mg | ORAL_TABLET | Freq: Once | ORAL | Status: DC | PRN

## 2022-03-10 MED ORDER — ACETAMINOPHEN 10 MG/ML IV SOLN: 1000.0000 mg | Freq: Once | INTRAVENOUS | Status: DC | PRN

## 2022-03-10 MED ORDER — ONDANSETRON HCL 4 MG/2ML IJ SOLN
INTRAMUSCULAR | Status: AC
  Filled 2022-03-10: qty 2

## 2022-03-10 MED ORDER — PROPOFOL 1000 MG/100ML IV EMUL
INTRAVENOUS | Status: AC
  Filled 2022-03-10: qty 100

## 2022-03-10 MED ORDER — LACTATED RINGERS IV SOLN: INTRAVENOUS | Status: DC

## 2022-03-10 MED ORDER — FENTANYL CITRATE PF 50 MCG/ML IJ SOSY: 25.0000 ug | PREFILLED_SYRINGE | INTRAMUSCULAR | Status: DC | PRN

## 2022-03-10 MED ORDER — PROPOFOL 500 MG/50ML IV EMUL
INTRAVENOUS | Status: DC | PRN
  Administered 2022-03-10: 75 ug/kg/min via INTRAVENOUS

## 2022-03-10 MED ORDER — BUPIVACAINE HCL (PF) 0.5 % IJ SOLN
INTRAMUSCULAR | Status: AC
  Filled 2022-03-10: qty 30

## 2022-03-10 MED ORDER — PROPOFOL 10 MG/ML IV BOLUS
INTRAVENOUS | Status: DC | PRN
  Administered 2022-03-10: 20 mg via INTRAVENOUS

## 2022-03-10 MED ORDER — ORAL CARE MOUTH RINSE: 15.0000 mL | Freq: Once | OROMUCOSAL | Status: AC

## 2022-03-10 MED ORDER — FENTANYL CITRATE (PF) 100 MCG/2ML IJ SOLN
INTRAMUSCULAR | Status: AC
  Filled 2022-03-10: qty 2

## 2022-03-10 MED ORDER — OXYCODONE-ACETAMINOPHEN 5-325 MG PO TABS: 1.0000 | ORAL_TABLET | ORAL | 0 refills | Status: AC | PRN

## 2022-03-10 SURGICAL SUPPLY — 50 items
BAG COUNTER SPONGE SURGICOUNT (BAG) ×1 IMPLANT
BLADE SURG 15 STRL LF DISP TIS (BLADE) ×1 IMPLANT
BLADE SURG 15 STRL SS (BLADE) ×1
BNDG ELASTIC 3X5.8 VLCR STR LF (GAUZE/BANDAGES/DRESSINGS) ×1 IMPLANT
BNDG ELASTIC 4X5.8 VLCR STR LF (GAUZE/BANDAGES/DRESSINGS) ×1 IMPLANT
BNDG ESMARK 4X9 LF (GAUZE/BANDAGES/DRESSINGS) IMPLANT
BNDG GAUZE DERMACEA FLUFF 4 (GAUZE/BANDAGES/DRESSINGS) ×1 IMPLANT
CHLORAPREP W/TINT 26 (MISCELLANEOUS) IMPLANT
CNTNR URN SCR LID CUP LEK RST (MISCELLANEOUS) IMPLANT
CONT SPEC 4OZ STRL OR WHT (MISCELLANEOUS)
COVER BACK TABLE 60X90IN (DRAPES) ×1 IMPLANT
CUFF TOURN SGL QUICK 18X4 (TOURNIQUET CUFF) IMPLANT
CUFF TOURN SGL QUICK 24 (TOURNIQUET CUFF)
CUFF TRNQT CYL 24X4X16.5-23 (TOURNIQUET CUFF) IMPLANT
DRAPE 3/4 80X56 (DRAPES) ×1 IMPLANT
DRAPE EXTREMITY T 121X128X90 (DISPOSABLE) ×1 IMPLANT
DRAPE SHEET LG 3/4 BI-LAMINATE (DRAPES) ×1 IMPLANT
DRAPE U-SHAPE 47X51 STRL (DRAPES) ×1 IMPLANT
ELECT REM PT RETURN 15FT ADLT (MISCELLANEOUS) ×1 IMPLANT
GAUZE SPONGE 4X4 12PLY STRL (GAUZE/BANDAGES/DRESSINGS) ×1 IMPLANT
GAUZE XEROFORM 1X8 LF (GAUZE/BANDAGES/DRESSINGS) IMPLANT
GLOVE BIO SURGEON STRL SZ7.5 (GLOVE) ×1 IMPLANT
GLOVE BIOGEL PI IND STRL 8 (GLOVE) ×1 IMPLANT
GOWN STRL REUS W/ TWL XL LVL3 (GOWN DISPOSABLE) ×1 IMPLANT
GOWN STRL REUS W/TWL XL LVL3 (GOWN DISPOSABLE) ×1
KIT BASIN OR (CUSTOM PROCEDURE TRAY) ×1 IMPLANT
KIT TURNOVER KIT A (KITS) IMPLANT
MANIFOLD NEPTUNE II (INSTRUMENTS) ×1 IMPLANT
NDL HYPO 25X1 1.5 SAFETY (NEEDLE) ×1 IMPLANT
NEEDLE HYPO 25X1 1.5 SAFETY (NEEDLE) ×1 IMPLANT
NS IRRIG 1000ML POUR BTL (IV SOLUTION) IMPLANT
PADDING CAST ABS COTTON 4X4 ST (CAST SUPPLIES) ×1 IMPLANT
SET IRRIG Y TYPE TUR BLADDER L (SET/KITS/TRAYS/PACK) IMPLANT
SPONGE T-LAP 4X18 ~~LOC~~+RFID (SPONGE) ×1 IMPLANT
STAPLER VISISTAT 35W (STAPLE) IMPLANT
STOCKINETTE 6  STRL (DRAPES) ×1
STOCKINETTE 6 STRL (DRAPES) ×1 IMPLANT
SUCTION FRAZIER HANDLE 10FR (MISCELLANEOUS)
SUCTION TUBE FRAZIER 10FR DISP (MISCELLANEOUS) IMPLANT
SUT ETHILON 3 0 PS 1 (SUTURE) IMPLANT
SUT ETHILON 4 0 PS 2 18 (SUTURE) IMPLANT
SUT MNCRL AB 3-0 PS2 18 (SUTURE) IMPLANT
SUT MNCRL AB 4-0 PS2 18 (SUTURE) IMPLANT
SUT VIC AB 2-0 SH 27 (SUTURE)
SUT VIC AB 2-0 SH 27XBRD (SUTURE) IMPLANT
SYR BULB EAR ULCER 3OZ GRN STR (SYRINGE) ×1 IMPLANT
SYR CONTROL 10ML LL (SYRINGE) ×1 IMPLANT
TUBE IRRIGATION SET MISONIX (TUBING) IMPLANT
UNDERPAD 30X36 HEAVY ABSORB (UNDERPADS AND DIAPERS) ×1 IMPLANT
YANKAUER SUCT BULB TIP NO VENT (SUCTIONS) ×1 IMPLANT

## 2022-03-10 NOTE — Anesthesia Preprocedure Evaluation (Addendum)
Anesthesia Evaluation  Patient identified by MRN, date of birth, ID band Patient awake    Reviewed: Allergy & Precautions, NPO status , Patient's Chart, lab work & pertinent test results  History of Anesthesia Complications Negative for: history of anesthetic complications  Airway Mallampati: II  TM Distance: >3 FB Neck ROM: Full    Dental  (+) Partial Upper, Partial Lower, Dental Advisory Given   Pulmonary neg pulmonary ROS   Pulmonary exam normal breath sounds clear to auscultation       Cardiovascular hypertension (HTN, valsartan-HCTZ), Pt. on medications (-) angina + Peripheral Vascular Disease  (-) Past MI, (-) Cardiac Stents and (-) CABG (-) dysrhythmias  Rhythm:Regular Rate:Normal  HLD   Neuro/Psych negative neurological ROS     GI/Hepatic negative GI ROS, Neg liver ROS,,,  Endo/Other  diabetes, Type 2, Insulin Dependent    Renal/GU negative Renal ROS     Musculoskeletal  (+) Arthritis ,    Abdominal  (+) + obese  Peds  Hematology negative hematology ROS (+)   Anesthesia Other Findings Last Trulicity: 07/31/76  Reproductive/Obstetrics                             Anesthesia Physical Anesthesia Plan  ASA: 3  Anesthesia Plan: MAC   Post-op Pain Management:    Induction: Intravenous  PONV Risk Score and Plan: 2 and Propofol infusion, Ondansetron and Treatment may vary due to age or medical condition  Airway Management Planned: Natural Airway and Simple Face Mask  Additional Equipment:   Intra-op Plan:   Post-operative Plan:   Informed Consent: I have reviewed the patients History and Physical, chart, labs and discussed the procedure including the risks, benefits and alternatives for the proposed anesthesia with the patient or authorized representative who has indicated his/her understanding and acceptance.     Dental advisory given  Plan Discussed with: CRNA and  Anesthesiologist  Anesthesia Plan Comments: (Discussed with patient risks of MAC including, but not limited to, minor pain or discomfort, hearing people in the room, and possible need for backup general anesthesia. Risks for general anesthesia also discussed including, but not limited to, sore throat, hoarse voice, chipped/damaged teeth, injury to vocal cords, nausea and vomiting, allergic reactions, lung infection, heart attack, stroke, and death. All questions answered. )        Anesthesia Quick Evaluation

## 2022-03-10 NOTE — Anesthesia Procedure Notes (Signed)
Procedure Name: MAC Date/Time: 03/10/2022 1:35 PM  Performed by: Claudia Desanctis, CRNAPre-anesthesia Checklist: Patient identified, Emergency Drugs available, Suction available and Patient being monitored Patient Re-evaluated:Patient Re-evaluated prior to induction Oxygen Delivery Method: Simple face mask

## 2022-03-10 NOTE — Discharge Instructions (Signed)
WB as tolerated postop shoe. Dressings clean dry and intact until f/u in office.

## 2022-03-10 NOTE — Interval H&P Note (Signed)
History and Physical Interval Note:  03/10/2022 1:27 PM  Carla Stokes  has presented today for surgery, with the diagnosis of Osteomyelitis of great toe of right foot.  The various methods of treatment have been discussed with the patient and family. After consideration of risks, benefits and other options for treatment, the patient has consented to  Procedure(s): BONE BIOPSY (Right) DEBRIDEMENT WOUND/ULCER (Right) as a surgical intervention.  The patient's history has been reviewed, patient examined, no change in status, stable for surgery.  I have reviewed the patient's chart and labs.  Questions were answered to the patient's satisfaction.     Edrick Kins

## 2022-03-10 NOTE — Transfer of Care (Signed)
Immediate Anesthesia Transfer of Care Note  Patient: Carla Stokes  Procedure(s) Performed: BONE BIOPSY (Right) DEBRIDEMENT WOUND/ULCER (Right)  Patient Location: PACU  Anesthesia Type:MAC  Level of Consciousness: patient cooperative  Airway & Oxygen Therapy: Patient Spontanous Breathing and Patient connected to face mask  Post-op Assessment: Report given to RN and Post -op Vital signs reviewed and stable  Post vital signs: Reviewed and stable  Last Vitals:  Vitals Value Taken Time  BP 110/61 03/10/22 1354  Temp    Pulse 47 03/10/22 1356  Resp 11 03/10/22 1356  SpO2 100 % 03/10/22 1356  Vitals shown include unvalidated device data.  Last Pain:  Vitals:   03/10/22 1118  TempSrc: Oral  PainSc:          Complications: No notable events documented.

## 2022-03-10 NOTE — Op Note (Signed)
OPERATIVE REPORT Patient name: Carla Stokes MRN: [id number] DOB: [birth year]  DOS: 03/10/22  Preop Dx: Osteomyelitis right great toe.  Ulcer right great toe Postop Dx: same  Procedure:  1.  Biopsy right great toe proximal phalanx 2.  Debridement of ulcer right great toe  Surgeon: Edrick Kins [name]  Anesthesia: 50-50 mixture of 2% lidocaine plain with 0.5% Marcaine plain totaling 6 mL infiltrated in the patient's right great toe digital block fashion  Hemostasis: None  EBL: Minimal mL Materials: None Injectables: None Pathology: Bone biopsy proximal phalanx right great toe sent for both culture and pathology  Condition: The patient tolerated the procedure and anesthesia well. No complications noted or reported   Justification for procedure: The patient is a 72 y.o. female PMHx diabetes mellitus who presents today for surgical correction of osteomyelitis right great toe based on MRI. The patient was told benefits as well as possible side effects of the surgery. The patient consented for surgical correction. The patient consent form was reviewed. All patient questions were answered. No guarantees were expressed or implied. The patient and the surgeon both signed the patient consent form with the witness present and placed in the patient's chart.   Procedure in Detail: The patient was brought to the operating room, placed in the operating table in the supine position at which time an aseptic scrub and drape were performed about the patient's respective lower extremity after anesthesia was induced as described above. Attention was then directed to the surgical area where procedure number one commenced.  Procedure #1: Bone biopsy proximal phalanx right great toe Is 0.5 cm linear longitudinal skin incision was planned and made overlying the distal portion of the proximal phalanx of the right great toe.  Within the incision site a Jamshidi bone biopsy needle was inserted and 2  specimens of bone from the distal phalanx was harvested.  One specimen was sent for culture and the other for pathology.  After appropriate harvesting of the bone cultures 4-0 Prolene was utilized for primary closure of the small incision site.  Attention was then directed to the plantar aspect of the right great toe where procedure 2 commenced  Procedure #2: Excisional debridement of ulcer right great toe Medically necessary excisional debridement including subcutaneous tissue was performed to the right great toe ulcer using a #15 scalpel.  Excisional debridement of the necrotic nonviable tissue down to healthier bleeding viable tissue was performed with postdebridement measurement same as pre-.  The wound measures approximately 0.5 x 1.5 x 0.3 cm.  The wound does not probe to bone.  Dry sterile compressive dressings were then applied to all previously mentioned incision sites about the patient's lower extremity. The patient was then transferred from the operating room to the recovery room having tolerated the procedure and anesthesia well. All vital signs are stable. After a brief stay in the recovery room the patient was readmitted to inpatient room with postoperative orders placed.    IMPRESSION: Bone biopsy proximal phalanx right great toe  Edrick Kins, [name] [facility]  Dr. Edrick Kins, [name]    [address], [place] [address]                Office [phone]  Fax [phone]

## 2022-03-10 NOTE — Brief Op Note (Signed)
03/10/2022  1:51 PM  PATIENT:  Carla Stokes  72 y.o. female  PRE-OPERATIVE DIAGNOSIS:  Osteomyelitis of great toe of right foot  POST-OPERATIVE DIAGNOSIS:  * No post-op diagnosis entered *  PROCEDURE:  Procedure(s): BONE BIOPSY (Right) DEBRIDEMENT WOUND/ULCER (Right)  SURGEON:  Surgeon(s) and Role:    Edrick Kins, [name] - Primary  PHYSICIAN ASSISTANT:   ASSISTANTS: none   ANESTHESIA:   local and MAC  EBL:  0 mL   BLOOD ADMINISTERED:none  DRAINS: none   LOCAL MEDICATIONS USED:  MARCAINE   , LIDOCAINE , and Amount: 10 ml  SPECIMEN:  Source of Specimen:  Bone proximal phalanx right hallux sent for BOTH culture and pathology  DISPOSITION OF SPECIMEN:  PATHOLOGY  COUNTS:  YES  TOURNIQUET:  * Missing tourniquet times found for documented tourniquets in log: [id number] *  DICTATION: .Dragon Dictation  PLAN OF CARE: Discharge to home after PACU  PATIENT DISPOSITION:  PACU - hemodynamically stable.   Delay start of Pharmacological VTE agent (>24hrs) due to surgical blood loss or risk of bleeding: not applicable  Edrick Kins, [name] [facility]  Dr. Edrick Kins, [name]    [address], [place] [address]                Office [phone]  Fax [phone]

## 2022-03-10 NOTE — Anesthesia Postprocedure Evaluation (Signed)
Anesthesia Post Note  Patient: Carla Stokes  Procedure(s) Performed: BONE BIOPSY (Right: Fifth Toe) DEBRIDEMENT WOUND/ULCER (Right: Fifth Toe)     Patient location during evaluation: PACU Anesthesia Type: MAC Level of consciousness: awake Pain management: pain level controlled Vital Signs Assessment: post-procedure vital signs reviewed and stable Respiratory status: spontaneous breathing, nonlabored ventilation and respiratory function stable Cardiovascular status: stable and blood pressure returned to baseline Postop Assessment: no apparent nausea or vomiting Anesthetic complications: no   No notable events documented.  Last Vitals:  Vitals:   03/10/22 1445 03/10/22 1453  BP: 130/63   Pulse: (!) 53   Resp: 18   Temp:  36.9 C  SpO2: 97%     Last Pain:  Vitals:   03/10/22 1430  TempSrc:   PainSc: 0-No pain                 Nilda Simmer

## 2022-03-11 ENCOUNTER — Telehealth: Payer: Self-pay

## 2022-03-11 ENCOUNTER — Encounter (HOSPITAL_COMMUNITY): Payer: Self-pay | Admitting: Podiatry

## 2022-03-11 NOTE — Telephone Encounter (Signed)
Yes okay to drive. - Dr. Amalia Hailey

## 2022-03-11 NOTE — Telephone Encounter (Signed)
Patient is aware 

## 2022-03-14 LAB — SURGICAL PATHOLOGY

## 2022-03-15 LAB — AEROBIC/ANAEROBIC CULTURE W GRAM STAIN (SURGICAL/DEEP WOUND): Culture: NO GROWTH

## 2022-03-16 ENCOUNTER — Ambulatory Visit (INDEPENDENT_AMBULATORY_CARE_PROVIDER_SITE_OTHER): Payer: Medicare HMO | Admitting: Internal Medicine

## 2022-03-16 ENCOUNTER — Telehealth: Payer: Self-pay

## 2022-03-16 ENCOUNTER — Other Ambulatory Visit: Payer: Self-pay

## 2022-03-16 VITALS — BP 174/90 | HR 54 | Temp 98.1°F | Ht 67.0 in | Wt 235.0 lb

## 2022-03-16 DIAGNOSIS — L97519 Non-pressure chronic ulcer of other part of right foot with unspecified severity: Secondary | ICD-10-CM

## 2022-03-16 DIAGNOSIS — E11621 Type 2 diabetes mellitus with foot ulcer: Secondary | ICD-10-CM

## 2022-03-16 DIAGNOSIS — M86271 Subacute osteomyelitis, right ankle and foot: Secondary | ICD-10-CM | POA: Diagnosis not present

## 2022-03-16 NOTE — Progress Notes (Signed)
[facility]  Reason for Consult: Foot ulcer and Osteomyelitis  Referring Provider: Dr Logan Bores   HPI:    Carla Stokes is a 72 y.o. 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   Patient has a history of diabetes and a ulcer on her right great toe that appears to have developed after a visit to the nail salon several months ago.  She underwent recent arteriogram with vascular surgery on 02/03/22 with findings of no flow limiting stenosis.  No intervention was completed.  MRI was subsequently obtained 02/23/22 showing:  "Marrow edema in the distal 1.7 cm the proximal phalanx of the great toe is new since the prior examination and consistent with osteomyelitis. Milder degree of edema in the base of the proximal phalanx of the great toe is likely due to stress change.   Skin wounds on the great toe without underlying abscess."   She had follow up with podiatry and Dr Logan Bores on 03/02/22 to discuss this result and he performed excisional debridement and recommended bone biopsy.  She underwent debridement and bone biopsy on 03/10/22 and presents today for antibiotic management.  She reports her ulcer has been present for about 6 months or so.  She has been on several courses of oral antibiotics during this period with continued non-healing.    Her surgical pathology from 03/10/22 showed benign sclerotic reactive bone and was negative for osteomyelitis.  Her bone cultures additionally were negative.  Prior wound tissue cultures have grown MSSA in October 2023.   Patient's Medications  New Prescriptions   No medications on file  Previous Medications   ACETAMINOPHEN (TYLENOL) 500 MG TABLET    Take 500-1,000 mg by mouth every 6 (six) hours as needed for moderate pain.   ALCOHOL SWABS (B-D SINGLE USE SWABS REGULAR) PADS       AMLODIPINE (NORVASC) 5 MG TABLET    Take 5 mg by mouth daily.    ASPIRIN EC 81 MG TABLET    Take 81 mg by mouth daily. Swallow whole.   CLINDAMYCIN (CLEOCIN) 300 MG CAPSULE    Take 1 capsule (300 mg total) by mouth 3 (three) times daily.   GENTAMICIN CREAM (GARAMYCIN) 0.1 %    Apply 1 Application topically 2 (two) times daily.   GENTAMICIN OINTMENT (GARAMYCIN) 0.1 %    Apply 1 Application topically in the morning and at bedtime.   INSULIN GLARGINE, 1 UNIT DIAL, (TOUJEO SOLOSTAR) 300 UNIT/ML SOLOSTAR PEN    Inject 80 Units into the skin at bedtime.   METFORMIN (GLUCOPHAGE) 1000 MG TABLET    Take 1,000 mg by mouth 2 (two) times daily with a meal.   MULTIPLE VITAMIN (MULTIVITAMIN WITH MINERALS) TABS TABLET    Take 1 tablet by mouth daily.   OXYCODONE-ACETAMINOPHEN (PERCOCET) 5-325 MG TABLET    Take 1 tablet by mouth every 4 (four) hours as needed for severe pain.   POVIDONE-IODINE (BETADINE) 10 % OINTMENT    Apply 1 Application topically daily.   ROSUVASTATIN (CRESTOR) 20 MG TABLET    Take 20 mg by mouth daily.   SULFAMETHOXAZOLE-TRIMETHOPRIM (BACTRIM DS) 800-160 MG TABLET    Take 1 tablet by mouth 2 (two) times daily.   TRULICITY 3 MG/0.5ML SOPN    Inject 3 mg into the skin every Tuesday.   VALSARTAN-HYDROCHLOROTHIAZIDE (DIOVAN-HCT) 320-25 MG TABLET    Take 1 tablet by mouth daily.  ZINC GLUCONATE 50 MG TABLET    Take 50 mg by mouth daily.  Modified Medications   No medications on file  Discontinued Medications   No medications on file      Past Medical History:  Diagnosis Date   Arthritis    Diabetes mellitus without complication (HCC)    High cholesterol    Hypertension     Social History   Tobacco Use   Smoking status: Never   Smokeless tobacco: Never  Vaping Use   Vaping Use: Never used  Substance Use Topics   Alcohol use: Not Currently   Drug use: Never    No family history on file.  Allergies  Allergen Reactions   Codeine     Other Reaction(s): Dizziness (intolerance)   Dapagliflozin Other (See Comments)    2017: yeast infection    Latex Itching    Review of Systems  Constitutional: Negative.   Respiratory: Negative.    Cardiovascular: Negative.   Gastrointestinal: Negative.   Skin:        + Wound  All other systems reviewed and are negative.     OBJECTIVE:    Vitals:   03/16/22 1013 03/16/22 1049  BP: (!) 149/81 (!) 174/90  Pulse: (!) 54   Temp: 98.1 F (36.7 C)   TempSrc: Temporal   SpO2: 100%   Weight: 235 lb (106.6 kg)   Height: 5\' 7"  (1.702 m)      Body mass index is 36.81 kg/m.  Physical Exam Constitutional:      Appearance: Normal appearance.  HENT:     Head: Normocephalic and atraumatic.  Cardiovascular:     Pulses: Normal pulses.  Musculoskeletal:     Cervical back: Normal range of motion and neck supple.     Comments: Small right plantar ulcer at base of 1st toe.  Appears fairly clean with good granulation.  No drainage.   Skin:    General: Skin is warm and dry.  Neurological:     General: No focal deficit present.     Mental Status: She is alert and oriented to person, place, and time.  Psychiatric:        Mood and Affect: Mood normal.        Behavior: Behavior normal.      Labs and Microbiology:     Latest Ref Rng & Units 03/07/2022   10:40 AM 02/03/2022   10:17 AM 10/20/2021    4:20 PM  CBC  WBC 4.0 - 10.5 K/uL 6.2   4.7   Hemoglobin 12.0 - 15.0 g/dL 10/22/2021  16.0  73.7   Hematocrit 36.0 - 46.0 % 37.8  35.0  40.9   Platelets 150 - 400 K/uL 181   181       Latest Ref Rng & Units 03/07/2022   10:40 AM 02/03/2022   10:17 AM 10/20/2021    4:20 PM  CMP  Glucose 70 - 99 mg/dL 10/22/2021  269  485   BUN 8 - 23 mg/dL 15  14  16    Creatinine 0.44 - 1.00 mg/dL 462   7.03   Sodium 135 - 145 mmol/L 143  143  139   Potassium 3.5 - 5.1 mmol/L 3.6  3.5  3.3   Chloride 98 - 111 mmol/L 105  102  100   CO2 22 - 32 mmol/L 30   29   Calcium 8.9 - 10.3 mg/dL 9.5   9.9   Total Protein 6.5 - 8.1 g/dL  7.8   Total Bilirubin 0.3 - 1.2 mg/dL   0.4   Alkaline Phos 38 - 126 U/L   60    AST 15 - 41 U/L   56   ALT 0 - 44 U/L   59      Recent Results (from the past 240 hour(s))  Aerobic/Anaerobic Culture w Gram Stain (surgical/deep wound)     Status: None   Collection Time: 03/10/22  1:57 PM   Specimen: Wound  Result Value Ref Range Status   Specimen Description   Final    WOUND BONE RIGHT GREAT TOE Performed at [facility] [address] [place], [place] [address]    Special Requests   Final    NONE Performed at [facility], [place] [address], [place], [place] [address]    Gram Stain   Final    WBC PRESENT, PREDOMINANTLY MONONUCLEAR NO ORGANISMS SEEN    Culture   Final    No growth aerobically or anaerobically. Performed at [facility], [place] [address], [place], [place] [address]    Report Status 03/15/2022 FINAL  Final     ASSESSMENT & PLAN:    Subacute osteomyelitis of right foot ([place]) Patient with history of diabetes and chronic ulcer of her right great toe complicated by osteomyelitis based on MRI.  She had an adequate vascular evaluation to ensure no blood flow issues are present to hinder her ability for the wound to heal.  She also had recent debridement and bone biopsy/pathology that did not show findings of osteomyelitis.  However, discussed with patient that osteomyelitis still may be present despite the negative biopsy and culture of a localized area as the infected portion may have been missed.  Also, discussed that MRI sometimes can be overly sensitive and findings of osteomyelitis may be more reactive than anything, but ultimately it is very challenging to say one way or the other that she does not have a deep bone infection.  We also discussed the difficulty of treating this type of infection.  She desires limb salvage and wants to be as aggressive as possible in order to do so.  Her prior cultures in October 2023 grew MSSA but notably were a superficial culture as opposed to deeper operative specimen.   Will plan for empiric course of osteomyelitis treatment given this uncertainty and her desire for limb salvage.  Anticipate IV therapy for 2 weeks lead in followed by oral therapy for an additional 4 weeks.  See OPAT note below and follow up in 2 weeks.   Anticipate transition to Doxycycline and Augmentin following her IV lead in.    Diagnosis: Osteomyelitis  Culture Result: negative  Allergies  Allergen Reactions   Codeine     Other Reaction(s): Dizziness (intolerance)   Dapagliflozin Other (See Comments)    2017: yeast infection   Latex Itching    OPAT Orders Discharge antibiotics to be given via PICC line Discharge antibiotics: Per pharmacy protocol  Daptomycin 650 mg IV daily Ceftriaxone 2gm IV daily  Duration: 2 weeks from date of PICC placement   [facility] Care Per Protocol:  Home health RN for IV administration and teaching; PICC line care and labs.    Labs weekly while on IV antibiotics: _xxx_ CBC with differential __ BMP _xxx_ CMP _xxx_ CRP _xxx_ ESR __ Vancomycin trough _xxx_ CK  __ Please pull PIC at completion of IV antibiotics __ Please leave PIC in place until doctor has seen patient or  been notified  Fax weekly labs to [phone]  Diabetic foot ulcer ([place]) Recent A1c was 7.2 and she is followed by her primary care.   Orders Placed This Encounter  Procedures   IR PICC PLACEMENT LEFT >5 YRS INC IMG GUIDE    Standing Status:   Future    Standing Expiration Date:   03/17/2023    Order Specific Question:   Reason for Exam (SYMPTOM  OR DIAGNOSIS REQUIRED)    Answer:   IV antibiotics    Order Specific Question:   Preferred Imaging Location?    Answer:   [facility]   CBC   COMPLETE METABOLIC PANEL WITH GFR   Sedimentation rate   C-reactive protein   CK (Creatine Kinase)      [facility] 03/16/2022, 11:44 AM  I have personally spent 60 minutes involved in  face-to-face and non-face-to-face activities for this patient on the day of the visit. Professional time spent includes the following activities: Preparing to see the patient (review of tests), Obtaining and/or reviewing separately obtained history (admission/discharge record), Performing a medically appropriate examination and/or evaluation , Ordering medications/tests/procedures, referring and communicating with other health care professionals, Documenting clinical information in the EMR, Independently interpreting results (not separately reported), Communicating results to the patient/family/caregiver, Counseling and educating the patient/family/caregiver and Care coordination (not separately reported).

## 2022-03-16 NOTE — Telephone Encounter (Addendum)
New OPAT orders per Dr. Juleen China , orders shared with Carolynn Sayers, RN at [facility] and [facility] staff.   IR appointment: 03/24/22 at 9 AM - appointment time and location provided to both patient and [facility].   First dose: will be done in home  Beryle Flock, RN

## 2022-03-16 NOTE — Assessment & Plan Note (Signed)
Recent A1c was 7.2 and she is followed by her primary care.

## 2022-03-16 NOTE — Assessment & Plan Note (Signed)
Patient with history of diabetes and chronic ulcer of her right great toe complicated by osteomyelitis based on MRI.  She had an adequate vascular evaluation to ensure no blood flow issues are present to hinder her ability for the wound to heal.  She also had recent debridement and bone biopsy/pathology that did not show findings of osteomyelitis.  However, discussed with patient that osteomyelitis still may be present despite the negative biopsy and culture of a localized area as the infected portion may have been missed.  Also, discussed that MRI sometimes can be overly sensitive and findings of osteomyelitis may be more reactive than anything, but ultimately it is very challenging to say one way or the other that she does not have a deep bone infection.  We also discussed the difficulty of treating this type of infection.  She desires limb salvage and wants to be as aggressive as possible in order to do so.  Her prior cultures in October 2023 grew MSSA but notably were a superficial culture as opposed to deeper operative specimen.  Will plan for empiric course of osteomyelitis treatment given this uncertainty and her desire for limb salvage.  Anticipate IV therapy for 2 weeks lead in followed by oral therapy for an additional 4 weeks.  See OPAT note below and follow up in 2 weeks.   Anticipate transition to Doxycycline and Augmentin following her IV lead in.    Diagnosis: Osteomyelitis  Culture Result: negative  Allergies  Allergen Reactions   Codeine     Other Reaction(s): Dizziness (intolerance)   Dapagliflozin Other (See Comments)    2017: yeast infection   Latex Itching    OPAT Orders Discharge antibiotics to be given via PICC line Discharge antibiotics: Per pharmacy protocol  Daptomycin 650 mg IV daily Ceftriaxone 2gm IV daily  Duration: 2 weeks from date of PICC placement   [facility] Care Per Protocol:  Home health RN for IV administration and teaching; PICC line care and labs.     Labs weekly while on IV antibiotics: _xxx_ CBC with differential __ BMP _xxx_ CMP _xxx_ CRP _xxx_ ESR __ Vancomycin trough _xxx_ CK  __ Please pull PIC at completion of IV antibiotics __ Please leave PIC in place until doctor has seen patient or been notified  Fax weekly labs to [phone]

## 2022-03-16 NOTE — Patient Instructions (Addendum)
PICC appointment 1/25 at 8:45 AM - go to [facility] entrance A and let them know you're there for interventional radiology  [facility] home infusion will be supplying your medication and arranging a home health agency for you. Their number is [phone]

## 2022-03-17 LAB — CBC
HCT: 37.9 % (ref 35.0–45.0)
Hemoglobin: 12.5 g/dL (ref 11.7–15.5)
MCH: 26.2 pg — ABNORMAL LOW (ref 27.0–33.0)
MCHC: 33 g/dL (ref 32.0–36.0)
MCV: 79.3 fL — ABNORMAL LOW (ref 80.0–100.0)
MPV: 11.3 fL (ref 7.5–12.5)
Platelets: 185 10*3/uL (ref 140–400)
RBC: 4.78 10*6/uL (ref 3.80–5.10)
RDW: 14.5 % (ref 11.0–15.0)
WBC: 5.7 10*3/uL (ref 3.8–10.8)

## 2022-03-17 LAB — COMPLETE METABOLIC PANEL WITH GFR
AG Ratio: 1.6 (calc) (ref 1.0–2.5)
ALT: 32 U/L — ABNORMAL HIGH (ref 6–29)
AST: 30 U/L (ref 10–35)
Albumin: 4.2 g/dL (ref 3.6–5.1)
Alkaline phosphatase (APISO): 60 U/L (ref 37–153)
BUN: 11 mg/dL (ref 7–25)
CO2: 29 mmol/L (ref 20–32)
Calcium: 9.7 mg/dL (ref 8.6–10.4)
Chloride: 106 mmol/L (ref 98–110)
Creat: 0.76 mg/dL (ref 0.50–1.05)
Globulin: 2.6 g/dL (calc) (ref 1.9–3.7)
Glucose, Bld: 107 mg/dL — ABNORMAL HIGH (ref 65–99)
Potassium: 3.8 mmol/L (ref 3.5–5.3)
Sodium: 144 mmol/L (ref 135–146)
Total Bilirubin: 0.4 mg/dL (ref 0.2–1.2)
Total Protein: 6.8 g/dL (ref 6.1–8.1)
eGFR: 85 mL/min/{1.73_m2} (ref >=60)

## 2022-03-17 LAB — C-REACTIVE PROTEIN: CRP: <0.2 mg/L (ref <8.0)

## 2022-03-17 LAB — CK: Total CK: 119 U/L (ref 29–143)

## 2022-03-17 LAB — SEDIMENTATION RATE: Sed Rate: 9 mm/h (ref 0–30)

## 2022-03-17 NOTE — Telephone Encounter (Signed)
Thank you Jinny Blossom!

## 2022-03-18 ENCOUNTER — Encounter: Payer: Self-pay | Admitting: Podiatry

## 2022-03-18 ENCOUNTER — Ambulatory Visit (INDEPENDENT_AMBULATORY_CARE_PROVIDER_SITE_OTHER): Payer: Medicare HMO | Admitting: Podiatry

## 2022-03-18 DIAGNOSIS — Z9889 Other specified postprocedural states: Secondary | ICD-10-CM

## 2022-03-18 DIAGNOSIS — E0843 Diabetes mellitus due to underlying condition with diabetic autonomic (poly)neuropathy: Secondary | ICD-10-CM

## 2022-03-18 DIAGNOSIS — L97512 Non-pressure chronic ulcer of other part of right foot with fat layer exposed: Secondary | ICD-10-CM

## 2022-03-18 NOTE — Progress Notes (Signed)
   Chief Complaint  Patient presents with   Routine Post Op    POV #1 DOS 03/10/2022 BONE BIOPSY RT GREAT TOE, DEBRIDEMENT OF ULCER RT GREAT TOE, NO N/V/F/C/SOB, patient is seeing [facility],  some swelling     Subjective:  Patient presents today status post bone biopsy of the right great toe proximal phalanx with underlying ulcer debridement.  DOS: 03/10/2022.  Patient was seen by infectious disease, Dr. Jule Ser 03/16/2022.  Presenting for follow-up treatment evaluation  Past Medical History:  Diagnosis Date   Arthritis    Diabetes mellitus without complication ([place])    High cholesterol    Hypertension     Past Surgical History:  Procedure Laterality Date   ABDOMINAL AORTOGRAM W/LOWER EXTREMITY N/A 02/03/2022   Procedure: ABDOMINAL AORTOGRAM W/LOWER EXTREMITY;  Surgeon: Marty Heck, MD;  Location: [facility];  Service: Cardiovascular;  Laterality: N/A;   ABDOMINAL HYSTERECTOMY     BONE BIOPSY Right 03/10/2022   Procedure: BONE BIOPSY;  Surgeon: Edrick Kins, [name];  Location: [facility];  Service: Podiatry;  Laterality: Right;   WOUND DEBRIDEMENT Right 03/10/2022   Procedure: DEBRIDEMENT WOUND/ULCER;  Surgeon: Edrick Kins, [name];  Location: [facility];  Service: Podiatry;  Laterality: Right;    Allergies  Allergen Reactions   Codeine     Other Reaction(s): Dizziness (intolerance)   Dapagliflozin Other (See Comments)    2017: yeast infection   Latex Itching    Objective/Physical Exam Neurovascular status intact.  The small incision to the dorsum of the right great toe has healed.  Moderate edema noted.  There is no heat or erythema to the toe.  The plantar great toe wound is stable  Assessment: 1. s/p bone biopsy right great toe with debridement of ulcer. DOS: 03/10/2022 -Continue WBAT postsurgical shoe -Bone biopsy: WBC present, predominantly mononuclear.  No organisms seen.  Culture: No growth aerobically or anaerobically.  Final report 03/15/2022 -Continue ID  management with Dr. Juleen China.  Always greatly appreciated -Appointment with diabetic shoe department for fitting for new diabetic shoes and custom molded insoles  -RTC 3 weeks   Edrick Kins, [name] [facility]  Dr. Edrick Kins, [name]    [address], [place] [address]                Office [phone]  Fax [phone]

## 2022-03-24 ENCOUNTER — Ambulatory Visit (HOSPITAL_COMMUNITY)
Admission: RE | Admit: 2022-03-24 | Discharge: 2022-03-24 | Disposition: A | Discharge status: Home / Self Care | Payer: Medicare HMO | Source: Ambulatory Visit | Attending: Internal Medicine | Admitting: Internal Medicine

## 2022-03-24 DIAGNOSIS — E11621 Type 2 diabetes mellitus with foot ulcer: Secondary | ICD-10-CM | POA: Diagnosis present

## 2022-03-24 DIAGNOSIS — L97519 Non-pressure chronic ulcer of other part of right foot with unspecified severity: Secondary | ICD-10-CM | POA: Diagnosis not present

## 2022-03-24 DIAGNOSIS — M86271 Subacute osteomyelitis, right ankle and foot: Secondary | ICD-10-CM | POA: Diagnosis not present

## 2022-03-24 MED ORDER — LIDOCAINE HCL 1 % IJ SOLN
INTRAMUSCULAR | Status: AC
  Administered 2022-03-24: 10 mL
  Filled 2022-03-24: qty 20

## 2022-03-24 MED ORDER — HEPARIN SOD (PORK) LOCK FLUSH 100 UNIT/ML IV SOLN
INTRAVENOUS | Status: AC
  Filled 2022-03-24: qty 5

## 2022-03-24 NOTE — Procedures (Signed)
Pre procedural Diagnosis: Poor venous access Post Procedural Diagnosis: Same  Successful placement of right brachial approach 40 cm single lumen PICC line with tip at the superior caval-atrial junction.    EBL: None No immediate post procedural complication.  The PICC line is ready for immediate use.  Ronny Bacon, MD Pager #: [phone]

## 2022-03-25 ENCOUNTER — Encounter: Payer: Medicare HMO | Admitting: Podiatry

## 2022-03-28 ENCOUNTER — Other Ambulatory Visit: Payer: Self-pay | Admitting: Internal Medicine

## 2022-03-28 ENCOUNTER — Ambulatory Visit (INDEPENDENT_AMBULATORY_CARE_PROVIDER_SITE_OTHER): Payer: Medicare HMO

## 2022-03-28 DIAGNOSIS — L97512 Non-pressure chronic ulcer of other part of right foot with fat layer exposed: Secondary | ICD-10-CM

## 2022-03-28 DIAGNOSIS — E11621 Type 2 diabetes mellitus with foot ulcer: Secondary | ICD-10-CM

## 2022-03-28 DIAGNOSIS — M86271 Subacute osteomyelitis, right ankle and foot: Secondary | ICD-10-CM

## 2022-03-28 DIAGNOSIS — E0843 Diabetes mellitus due to underlying condition with diabetic autonomic (poly)neuropathy: Secondary | ICD-10-CM

## 2022-03-28 NOTE — Progress Notes (Signed)
Patient presents to the office today for diabetic shoe and insole measuring.  Patient was measured with brannock device to determine size and width for 1 pair of extra depth shoes and foam casted for 3 pair of insoles.   ABN signed.   Documentation of medical necessity will be sent to patient's treating diabetic doctor to verify and sign.   Patient's diabetic provider: Dolores Lory., MD   NPI: [id number]   Shoes and insoles will be ordered at that time and patient will be notified for an appointment for fitting when they arrive.   Brannock measurement: 12.5 W  Patient shoe selection-   1st   Shoe choice:   [place] size ordered: 12.5 W

## 2022-03-30 ENCOUNTER — Encounter: Payer: Self-pay | Admitting: Internal Medicine

## 2022-03-30 ENCOUNTER — Ambulatory Visit: Payer: Medicare HMO | Admitting: Internal Medicine

## 2022-03-30 ENCOUNTER — Other Ambulatory Visit: Payer: Self-pay

## 2022-03-30 ENCOUNTER — Telehealth: Payer: Self-pay

## 2022-03-30 VITALS — BP 175/68 | HR 55 | Temp 98.1°F | Wt 230.0 lb

## 2022-03-30 DIAGNOSIS — M86271 Subacute osteomyelitis, right ankle and foot: Secondary | ICD-10-CM | POA: Diagnosis not present

## 2022-03-30 DIAGNOSIS — Z452 Encounter for adjustment and management of vascular access device: Secondary | ICD-10-CM

## 2022-03-30 DIAGNOSIS — L97519 Non-pressure chronic ulcer of other part of right foot with unspecified severity: Secondary | ICD-10-CM

## 2022-03-30 DIAGNOSIS — E11621 Type 2 diabetes mellitus with foot ulcer: Secondary | ICD-10-CM | POA: Diagnosis not present

## 2022-03-30 MED ORDER — AMOXICILLIN-POT CLAVULANATE 875-125 MG PO TABS: 1.0000 | ORAL_TABLET | Freq: Two times a day (BID) | ORAL | 0 refills | Status: DC

## 2022-03-30 MED ORDER — DOXYCYCLINE HYCLATE 100 MG PO TABS: 100.0000 mg | ORAL_TABLET | Freq: Two times a day (BID) | ORAL | 0 refills | Status: DC

## 2022-03-30 NOTE — Assessment & Plan Note (Signed)
Continuing with wound care.

## 2022-03-30 NOTE — Telephone Encounter (Signed)
Per Dr. Linus Salmons, okay to pull PICC after last dose. Orders sent to Carolynn Sayers, RN with [facility].   Beryle Flock, RN

## 2022-03-30 NOTE — Progress Notes (Signed)
   Subjective:    Patient ID: Carla Stokes, female    DOB: November 30, 1950, 72 y.o.   MRN: [id number]  HPI Carla Stokes is here for follow up of osteomyelitis. She has a history of diabetes and an ulcer of her right great toe and an MRI c/w marrow edema of the distal great toe and followed by Dr. Amalia Hailey.  She was started on daptomycin and ceftriaxone and plan for 2 weeks lead in followed by prolonged oral antiibotics.  She started 1/25 and plan for IV treatment through 2/8.  No issues with antibiotics including no rash or diarrhea.     Review of Systems  Constitutional:  Negative for fatigue.  Gastrointestinal:  Negative for diarrhea and nausea.  Skin:  Negative for rash.       Objective:   Physical Exam Eyes:     General: No scleral icterus. Pulmonary:     Effort: Pulmonary effort is normal.  Musculoskeletal:     Comments: Ulcer of great toe with no drainage  Skin:    Findings: No rash.  Neurological:     Mental Status: She is alert.   SH: no tobacco        Assessment & Plan:

## 2022-03-30 NOTE — Assessment & Plan Note (Signed)
Working well, no issues.   

## 2022-03-30 NOTE — Assessment & Plan Note (Signed)
Feels about the same and tolerating the antibiotics well.  No changes and plan for treatment through Feb 8 and transition to oral therapy for 1 month after that.  Doxycycline and Augmentin sent to the pharmacy to start 2/9

## 2022-04-06 LAB — LAB REPORT - SCANNED: EGFR: 94

## 2022-04-08 ENCOUNTER — Ambulatory Visit (INDEPENDENT_AMBULATORY_CARE_PROVIDER_SITE_OTHER): Payer: Medicare HMO | Admitting: Podiatry

## 2022-04-08 DIAGNOSIS — L97512 Non-pressure chronic ulcer of other part of right foot with fat layer exposed: Secondary | ICD-10-CM

## 2022-04-11 NOTE — Progress Notes (Signed)
   Chief Complaint  Patient presents with   Routine Post Op    POV #3 DOS 03/10/2022 BONE BIOPSY RT GREAT TOE, DEBRIDEMENT OF ULCER RT GREAT TOE   Nail Problem    Nail trim     Subjective:  Patient presents today status post bone biopsy of the right great toe proximal phalanx with underlying ulcer debridement.  DOS: 03/10/2022.  Patient states that she has completed her IV antibiotics that were prescribed by infectious disease via PICC line.  She is doing well.  She has a scheduled appointment to have the PICC line removed by the nurse.  Past Medical History:  Diagnosis Date   Arthritis    Diabetes mellitus without complication ([place])    High cholesterol    Hypertension     Past Surgical History:  Procedure Laterality Date   ABDOMINAL AORTOGRAM W/LOWER EXTREMITY N/A 02/03/2022   Procedure: ABDOMINAL AORTOGRAM W/LOWER EXTREMITY;  Surgeon: Marty Heck, MD;  Location: [facility];  Service: Cardiovascular;  Laterality: N/A;   ABDOMINAL HYSTERECTOMY     BONE BIOPSY Right 03/10/2022   Procedure: BONE BIOPSY;  Surgeon: Edrick Kins, [name];  Location: [facility];  Service: Podiatry;  Laterality: Right;   WOUND DEBRIDEMENT Right 03/10/2022   Procedure: DEBRIDEMENT WOUND/ULCER;  Surgeon: Edrick Kins, [name];  Location: [facility];  Service: Podiatry;  Laterality: Right;    Allergies  Allergen Reactions   Codeine     Other Reaction(s): Dizziness (intolerance)   Dapagliflozin Other (See Comments)    2017: yeast infection   Latex Itching    Objective/Physical Exam Neurovascular status intact.  The small incision to the dorsum of the right great toe has healed.  There continues to be improvement of the toe.  There is some low-grade edema however there is no acute erythema or drainage coming from the ulcers.  Overall it appears very stable with good potential for healing superficially  Assessment: 1. s/p bone biopsy right great toe with debridement of ulcer. DOS: 03/10/2022 -Continue  WBAT postsurgical shoe - Light debridement of the area was performed today using a tissue nipper without incident or bleeding -Patient has completed her IV antibiotic regimen via PICC line.  Return to clinic in 4 weeks for follow-up and x-ray or sooner if she notices increased erythema or edema to the area   Edrick Kins, [name] [facility]  Dr. Edrick Kins, [name]    [address], [place] [address]                Office [phone]  Fax [phone]

## 2022-04-13 DIAGNOSIS — K648 Other hemorrhoids: Secondary | ICD-10-CM | POA: Insufficient documentation

## 2022-04-26 ENCOUNTER — Other Ambulatory Visit: Payer: Self-pay | Admitting: Internal Medicine

## 2022-04-27 ENCOUNTER — Ambulatory Visit (INDEPENDENT_AMBULATORY_CARE_PROVIDER_SITE_OTHER): Payer: Medicare HMO | Admitting: Podiatry

## 2022-04-27 ENCOUNTER — Encounter: Payer: Self-pay | Admitting: Internal Medicine

## 2022-04-27 ENCOUNTER — Ambulatory Visit: Payer: Medicare HMO | Admitting: Internal Medicine

## 2022-04-27 ENCOUNTER — Other Ambulatory Visit: Payer: Self-pay

## 2022-04-27 VITALS — BP 126/77 | HR 62 | Temp 97.7°F | Wt 230.0 lb

## 2022-04-27 DIAGNOSIS — M79675 Pain in left toe(s): Secondary | ICD-10-CM | POA: Diagnosis not present

## 2022-04-27 DIAGNOSIS — B351 Tinea unguium: Secondary | ICD-10-CM

## 2022-04-27 DIAGNOSIS — E0843 Diabetes mellitus due to underlying condition with diabetic autonomic (poly)neuropathy: Secondary | ICD-10-CM | POA: Diagnosis not present

## 2022-04-27 DIAGNOSIS — L97512 Non-pressure chronic ulcer of other part of right foot with fat layer exposed: Secondary | ICD-10-CM

## 2022-04-27 DIAGNOSIS — M79674 Pain in right toe(s): Secondary | ICD-10-CM | POA: Diagnosis not present

## 2022-04-27 DIAGNOSIS — M86271 Subacute osteomyelitis, right ankle and foot: Secondary | ICD-10-CM | POA: Diagnosis not present

## 2022-04-27 NOTE — Assessment & Plan Note (Addendum)
She is doing well on treatment and I am hopeful she will be cured.  I will repeat the inflammatory markers today and have her complete the 4 weeks of oral antibiotics and then stop.  I had a long discussion on expectations and chances of cure, which are unknown and the true test of cure is observing off of antibiotics.   I will have her return in about 3 weeks which will be about 2 weeks off of antibiotics and recheck her toe and labs.   I have personally spent 30 minutes involved in face-to-face and non-face-to-face activities for this patient on the day of the visit. Professional time spent includes the following activities: Preparing to see the patient (review of tests), Obtaining and/or reviewing separately obtained history (admission/discharge record), Performing a medically appropriate examination and/or evaluation , Ordering medications/tests/procedures, referring and communicating with other health care professionals, Documenting clinical information in the EMR, Independently interpreting results (not separately reported), Communicating results to the patient/family/caregiver, Counseling and educating the patient/family/caregiver and Care coordination (not separately reported).

## 2022-04-27 NOTE — Progress Notes (Signed)
   No chief complaint on file.   Subjective:  Patient presents today status post bone biopsy of the right great toe proximal phalanx with underlying ulcer debridement.  DOS: 03/10/2022.  Patient states that she has completed her IV antibiotics that were prescribed by infectious disease via PICC line.  She is doing well.  She has a scheduled appointment to have the PICC line removed by the nurse.  Past Medical History:  Diagnosis Date   Arthritis    Diabetes mellitus without complication ([place])    High cholesterol    Hypertension     Past Surgical History:  Procedure Laterality Date   ABDOMINAL AORTOGRAM W/LOWER EXTREMITY N/A 02/03/2022   Procedure: ABDOMINAL AORTOGRAM W/LOWER EXTREMITY;  Surgeon: Marty Heck, MD;  Location: [facility];  Service: Cardiovascular;  Laterality: N/A;   ABDOMINAL HYSTERECTOMY     BONE BIOPSY Right 03/10/2022   Procedure: BONE BIOPSY;  Surgeon: Edrick Kins, [name];  Location: [facility];  Service: Podiatry;  Laterality: Right;   WOUND DEBRIDEMENT Right 03/10/2022   Procedure: DEBRIDEMENT WOUND/ULCER;  Surgeon: Edrick Kins, [name];  Location: [facility];  Service: Podiatry;  Laterality: Right;    Allergies  Allergen Reactions   Codeine     Other Reaction(s): Dizziness (intolerance)   Dapagliflozin Other (See Comments)    2017: yeast infection   Latex Itching    Objective/Physical Exam Neurovascular status intact.  There continues to be a small ulcer to the plantar aspect of the great toe which is very superficial and stable.  Assessment: 1. s/p bone biopsy right great toe with debridement of ulcer. DOS: 03/10/2022 -Continue WBAT postsurgical shoe - Patient has completed her IV PICC line.  Currently on oral antibiotics -Diabetic shoes were dispensed and fitted today.  Patient satisfied with the fit and fill of the insoles and diabetic shoes -Continue management at the [facility] wound care center -Mechanical debridement of nails 1-5 bilateral  was performed using a nail nipper without incident or bleeding -Patient has follow-up appointment with infectious disease in 3 weeks.  Follow-up with me after in 4 weeks.  Edrick Kins, [name] [facility]  Dr. Edrick Kins, [name]    [address],  [address]                Office [phone]  Fax [phone]

## 2022-04-27 NOTE — Progress Notes (Signed)
   Subjective:    Patient ID: Carla Stokes, female    DOB: 12-01-1950, 72 y.o.   MRN: [id number]  HPI Carla Stokes is here for follow up of osteomyelitis.  She has a history of diabetes and an ulcer of her right great toe and an MRI c/w marrow edema of the distal great toe and followed by Dr. Amalia Stokes.  She was started on daptomycin and ceftriaxone and completed 2 weeks and now on doxycycline and Augmentin for a planned 4 weeks to complete 6 weeks.  She is tolerating the antibiotics well.  Mutliple questions inlcuding how to know when it is cured, chances of recovery and avoiding amputation.    Review of Systems  Constitutional:  Negative for chills and fever.  Gastrointestinal:  Negative for diarrhea and nausea.  Skin:  Negative for rash.       Objective:   Physical Exam Eyes:     General: No scleral icterus. Pulmonary:     Effort: Pulmonary effort is normal.  Musculoskeletal:     Comments: Ulcer of great toe with no drainage  Skin:    Findings: No rash.  Neurological:     Mental Status: She is alert.   SH: no tobacco        Assessment & Plan:

## 2022-04-28 LAB — COMPLETE METABOLIC PANEL WITH GFR
AG Ratio: 1.8 (calc) (ref 1.0–2.5)
ALT: 50 U/L — ABNORMAL HIGH (ref 6–29)
AST: 43 U/L — ABNORMAL HIGH (ref 10–35)
Albumin: 4.4 g/dL (ref 3.6–5.1)
Alkaline phosphatase (APISO): 57 U/L (ref 37–153)
BUN: 17 mg/dL (ref 7–25)
CO2: 32 mmol/L (ref 20–32)
Calcium: 9.8 mg/dL (ref 8.6–10.4)
Chloride: 105 mmol/L (ref 98–110)
Creat: 0.98 mg/dL (ref 0.50–1.05)
Globulin: 2.5 g/dL (calc) (ref 1.9–3.7)
Glucose, Bld: 143 mg/dL — ABNORMAL HIGH (ref 65–99)
Potassium: 4.3 mmol/L (ref 3.5–5.3)
Sodium: 145 mmol/L (ref 135–146)
Total Bilirubin: 0.5 mg/dL (ref 0.2–1.2)
Total Protein: 6.9 g/dL (ref 6.1–8.1)
eGFR: 62 mL/min/{1.73_m2} (ref >=60)

## 2022-04-28 LAB — CBC WITH DIFFERENTIAL/PLATELET
Absolute Monocytes: 353 cells/uL (ref 200–950)
Basophils Absolute: 23 cells/uL (ref 0–200)
Basophils Relative: 0.4 %
Eosinophils Absolute: 120 cells/uL (ref 15–500)
Eosinophils Relative: 2.1 %
HCT: 39.2 % (ref 35.0–45.0)
Hemoglobin: 12.8 g/dL (ref 11.7–15.5)
Lymphs Abs: 1436 cells/uL (ref 850–3900)
MCH: 25.5 pg — ABNORMAL LOW (ref 27.0–33.0)
MCHC: 32.7 g/dL (ref 32.0–36.0)
MCV: 78.1 fL — ABNORMAL LOW (ref 80.0–100.0)
MPV: 11.9 fL (ref 7.5–12.5)
Monocytes Relative: 6.2 %
Neutro Abs: 3768 cells/uL (ref 1500–7800)
Neutrophils Relative %: 66.1 %
Platelets: 175 10*3/uL (ref 140–400)
RBC: 5.02 10*6/uL (ref 3.80–5.10)
RDW: 15.3 % — ABNORMAL HIGH (ref 11.0–15.0)
Total Lymphocyte: 25.2 %
WBC: 5.7 10*3/uL (ref 3.8–10.8)

## 2022-04-28 LAB — SEDIMENTATION RATE: Sed Rate: 2 mm/h (ref 0–30)

## 2022-04-28 LAB — C-REACTIVE PROTEIN: CRP: <0.2 mg/L (ref <8.0)

## 2022-04-28 NOTE — Progress Notes (Signed)
Patient presents to the office today with issues concerning the diabetic shoes picked up on 04/27/2022.   The shoes feel tight.  We will send the [employer] brand, style Serene  Tan, size 12 back.   Reorder: 12 1/2 wide  Patient kept insoles for other shoes. Advised to bring back a pair to check fit of reorder.  Patient will be notified for a fitting appointment once the shoes arrive in office.

## 2022-05-17 ENCOUNTER — Ambulatory Visit: Payer: Medicare HMO | Admitting: Internal Medicine

## 2022-05-18 ENCOUNTER — Ambulatory Visit: Payer: Medicare HMO | Admitting: Podiatry

## 2022-05-18 ENCOUNTER — Encounter: Payer: Self-pay | Admitting: Internal Medicine

## 2022-05-18 ENCOUNTER — Other Ambulatory Visit: Payer: Self-pay

## 2022-05-18 ENCOUNTER — Ambulatory Visit: Payer: Medicare HMO | Admitting: Internal Medicine

## 2022-05-18 VITALS — BP 135/75 | HR 60 | Resp 16 | Ht 67.0 in | Wt 229.0 lb

## 2022-05-18 DIAGNOSIS — L97512 Non-pressure chronic ulcer of other part of right foot with fat layer exposed: Secondary | ICD-10-CM

## 2022-05-18 DIAGNOSIS — M86271 Subacute osteomyelitis, right ankle and foot: Secondary | ICD-10-CM | POA: Diagnosis not present

## 2022-05-18 NOTE — Progress Notes (Signed)
   Subjective:    Patient ID: Carla Stokes, female    DOB: 01/10/1951, 72 y.o.   MRN: [id number]  HPI Carla Stokes is here for follow up of osteomyelitis of the right foot.  She developed an ulcer of the right great toe and an MRI concerning for osteomyelitis and treated with daptomycin and ceftriaxone for 2 weeks followed by doxycycline and Augmentin for 4 weeks, which she has now completed.  Last CRP and ESR wnl last visit.    Review of Systems  Constitutional:  Negative for chills and fever.  Gastrointestinal:  Negative for diarrhea.  Skin:  Negative for rash.       Objective:   Physical Exam Eyes:     General: No scleral icterus. Pulmonary:     Effort: Pulmonary effort is normal.  Skin:    Findings: No rash.  Neurological:     Mental Status: She is alert.   SH: no tobacco        Assessment & Plan:

## 2022-05-18 NOTE — Assessment & Plan Note (Signed)
She is doing well now, healed up and no new concerns.  I discussed return precautions.  No further antibiotics indicated.  She will return as needed.   I have personally spent 30 minutes involved in face-to-face and non-face-to-face activities for this patient on the day of the visit. Professional time spent includes the following activities: Preparing to see the patient (review of tests), Obtaining and/or reviewing separately obtained history (admission/discharge record), Performing a medically appropriate examination and/or evaluation , Ordering medications/tests/procedures, referring and communicating with other health care professionals, Documenting clinical information in the EMR, Independently interpreting results (not separately reported), Communicating results to the patient/family/caregiver, Counseling and educating the patient/family/caregiver and Care coordination (not separately reported).

## 2022-05-18 NOTE — Progress Notes (Signed)
   Chief Complaint  Patient presents with   Diabetic Ulcer    diabetic ulcer right foot hallux     Subjective:  Patient presents today status post bone biopsy of the right great toe proximal phalanx with underlying ulcer debridement.  DOS: 03/10/2022.  Patient states that she has completed her IV antibiotics that were prescribed by infectious disease via PICC line.  Patient has also completed the oral antibiotics prescribed.  She was seen by Dr. Linus Salmons this a.m.  He has signed off from an infectious disease standpoint.  She is doing well and applies a Band-Aid to the foot daily  Past Medical History:  Diagnosis Date   Arthritis    Diabetes mellitus without complication ([place])    High cholesterol    Hypertension     Past Surgical History:  Procedure Laterality Date   ABDOMINAL AORTOGRAM W/LOWER EXTREMITY N/A 02/03/2022   Procedure: ABDOMINAL AORTOGRAM W/LOWER EXTREMITY;  Surgeon: Marty Heck, MD;  Location: [facility];  Service: Cardiovascular;  Laterality: N/A;   ABDOMINAL HYSTERECTOMY     BONE BIOPSY Right 03/10/2022   Procedure: BONE BIOPSY;  Surgeon: Edrick Kins, [name];  Location: [facility];  Service: Podiatry;  Laterality: Right;   WOUND DEBRIDEMENT Right 03/10/2022   Procedure: DEBRIDEMENT WOUND/ULCER;  Surgeon: Edrick Kins, [name];  Location: [facility];  Service: Podiatry;  Laterality: Right;    Allergies  Allergen Reactions   Codeine     Other Reaction(s): Dizziness (intolerance)   Dapagliflozin Other (See Comments)    2017: yeast infection   Latex Itching    RT great toe 05/18/2022  Objective/Physical Exam Neurovascular status intact.  There continues to be a small focal area to the plantar aspect of the great toe which may consist of a draining type sinus although there is really no drainage.  No malodor.  The wound does not probe to bone  Assessment: 1. s/p bone biopsy right great toe with debridement of ulcer. DOS: 03/10/2022 -Continue WBAT postsurgical  shoe - Patient has completed both IV PICC line and oral ABX as per ID.  ID has signed off -Will continue to observe -Return to clinic 1 month  Edrick Kins, [name] [facility]  Dr. Edrick Kins, [name]    [address], [place] [address]                Office [phone]  Fax [phone]

## 2022-06-07 ENCOUNTER — Ambulatory Visit (INDEPENDENT_AMBULATORY_CARE_PROVIDER_SITE_OTHER): Payer: Medicare HMO | Admitting: Podiatry

## 2022-06-07 DIAGNOSIS — E0843 Diabetes mellitus due to underlying condition with diabetic autonomic (poly)neuropathy: Secondary | ICD-10-CM

## 2022-06-07 DIAGNOSIS — L97512 Non-pressure chronic ulcer of other part of right foot with fat layer exposed: Secondary | ICD-10-CM

## 2022-06-07 NOTE — Progress Notes (Signed)
Patient presents today to pick up diabetic shoes and insoles.  Patient was dispensed 1 pair of diabetic shoes and 3 pairs of foam casted diabetic insoles. She tried on the shoes with the insoles and the fit was satisfactory.   Will follow up next year for new order.    

## 2022-06-15 ENCOUNTER — Ambulatory Visit (INDEPENDENT_AMBULATORY_CARE_PROVIDER_SITE_OTHER): Payer: Medicare HMO

## 2022-06-15 ENCOUNTER — Ambulatory Visit: Payer: Medicare HMO | Admitting: Podiatry

## 2022-06-15 DIAGNOSIS — L97512 Non-pressure chronic ulcer of other part of right foot with fat layer exposed: Secondary | ICD-10-CM

## 2022-06-15 DIAGNOSIS — E0843 Diabetes mellitus due to underlying condition with diabetic autonomic (poly)neuropathy: Secondary | ICD-10-CM

## 2022-06-15 NOTE — Progress Notes (Signed)
   Chief Complaint  Patient presents with   Diabetes    4 week follow up  Pt stated that she is doing well but does have some discomfort     Subjective:  Patient presents today status post bone biopsy of the right great toe proximal phalanx with underlying ulcer debridement.  DOS: 03/10/2022.  Patient has completed her IV antibiotics that were prescribed by infectious disease via PICC line.  Patient has also completed oral antibiotics prescribed.  Dr. Luciana Axe, infectious disease has signed off from their standpoint for the moment.  She is doing well and applies a Band-Aid to the foot daily.  The patient does admit today to ambulating barefoot around the house or with only socks.  Past Medical History:  Diagnosis Date   Arthritis    Diabetes mellitus without complication (HCC)    High cholesterol    Hypertension     Past Surgical History:  Procedure Laterality Date   ABDOMINAL AORTOGRAM W/LOWER EXTREMITY N/A 02/03/2022   Procedure: ABDOMINAL AORTOGRAM W/LOWER EXTREMITY;  Surgeon: Cephus Shelling, MD;  Location: [facility];  Service: Cardiovascular;  Laterality: N/A;   ABDOMINAL HYSTERECTOMY     BONE BIOPSY Right 03/10/2022   Procedure: BONE BIOPSY;  Surgeon: Felecia Shelling, [name];  Location: [facility];  Service: Podiatry;  Laterality: Right;   WOUND DEBRIDEMENT Right 03/10/2022   Procedure: DEBRIDEMENT WOUND/ULCER;  Surgeon: Felecia Shelling, [name];  Location: [facility];  Service: Podiatry;  Laterality: Right;    Allergies  Allergen Reactions   Codeine     Other Reaction(s): Dizziness (intolerance)   Dapagliflozin Other (See Comments)    2017: yeast infection   Latex Itching    RT great toe 05/18/2022   RT great toe 06/15/2022  Objective/Physical Exam Neurovascular status intact.  Ulcer noted plantar aspect of the right great toe measuring approximately 0.7 x 0.7 x 0.3 cm.  Granular wound base.  The wound does appear to probe to bone.  No malodor.  No purulence.  Adequate  bleeding noted upon debridement  Assessment: 1. s/p bone biopsy right great toe with debridement of ulcer. DOS: 03/10/2022 -Continue WBAT postsurgical shoe - Patient has completed both IV PICC line and oral ABX as per ID.  ID has signed off - Medically necessary excisional debridement including subcutaneous tissue was performed using a tissue nipper.  Excisional debridement of the necrotic nonviable tissue down to healthier bleeding viable tissue was performed with postdebridement measurement same as pre- -Continue triple antibiotic ointment and a Band-Aid daily -Advised against going barefoot or socks only.  Patient has diabetic shoes with custom molded Plastizote insoles.  Recommend wearing daily -Return to clinic 4 weeks  Felecia Shelling, [name] [facility]  Dr. Felecia Shelling, [name]    [address] [place], [place] [address]                Office [phone]  Fax [phone]

## 2022-06-27 ENCOUNTER — Other Ambulatory Visit: Payer: Self-pay | Admitting: Podiatry

## 2022-06-27 DIAGNOSIS — L97512 Non-pressure chronic ulcer of other part of right foot with fat layer exposed: Secondary | ICD-10-CM

## 2022-06-27 DIAGNOSIS — E0843 Diabetes mellitus due to underlying condition with diabetic autonomic (poly)neuropathy: Secondary | ICD-10-CM

## 2022-07-18 ENCOUNTER — Encounter: Payer: Self-pay | Admitting: Internal Medicine

## 2022-07-20 ENCOUNTER — Ambulatory Visit: Payer: Medicare HMO | Admitting: Podiatry

## 2022-07-20 DIAGNOSIS — E0843 Diabetes mellitus due to underlying condition with diabetic autonomic (poly)neuropathy: Secondary | ICD-10-CM | POA: Diagnosis not present

## 2022-07-20 DIAGNOSIS — L97512 Non-pressure chronic ulcer of other part of right foot with fat layer exposed: Secondary | ICD-10-CM | POA: Diagnosis not present

## 2022-07-20 NOTE — Progress Notes (Signed)
   Chief Complaint  Patient presents with   Wound Check    Patient came in today for right foot wound check, hallux, sand diabetic nail trim    Subjective:  Patient presents today status post bone biopsy of the right great toe proximal phalanx with underlying ulcer debridement.  DOS: 03/10/2022.  Patient has completed her IV antibiotics that were prescribed by infectious disease via PICC line.  Patient has also completed oral antibiotics prescribed.  Dr. Luciana Axe, infectious disease has signed off from their standpoint for the moment.  She is doing well and applies a Band-Aid to the foot daily.  Patient states that she has been better about going barefoot.  She wears her diabetic shoes and insoles when she is not in the house.  Around the house she wears house slippers.  Past Medical History:  Diagnosis Date   Arthritis    Diabetes mellitus without complication (HCC)    High cholesterol    Hypertension     Past Surgical History:  Procedure Laterality Date   ABDOMINAL AORTOGRAM W/LOWER EXTREMITY N/A 02/03/2022   Procedure: ABDOMINAL AORTOGRAM W/LOWER EXTREMITY;  Surgeon: Cephus Shelling, MD;  Location: [facility];  Service: Cardiovascular;  Laterality: N/A;   ABDOMINAL HYSTERECTOMY     BONE BIOPSY Right 03/10/2022   Procedure: BONE BIOPSY;  Surgeon: Felecia Shelling, [name];  Location: [facility];  Service: Podiatry;  Laterality: Right;   WOUND DEBRIDEMENT Right 03/10/2022   Procedure: DEBRIDEMENT WOUND/ULCER;  Surgeon: Felecia Shelling, [name];  Location: [facility];  Service: Podiatry;  Laterality: Right;    Allergies  Allergen Reactions   Codeine     Other Reaction(s): Dizziness (intolerance)   Dapagliflozin Other (See Comments)    2017: yeast infection   Latex Itching    RT great toe 05/18/2022   RT great toe 06/15/2022  Objective/Physical Exam Neurovascular status intact.  Overall there is some improvement of the ulcer.  Ulcer noted plantar aspect of the right great toe measuring  approximately 0.5 x 0.5 x 0.2 cm.  Granular wound base.  The wound does appear to probe to bone.  No malodor.  No purulence.  Adequate bleeding noted upon debridement  Assessment: 1. s/p bone biopsy right great toe with debridement of ulcer. DOS: 03/10/2022 -Continue diabetic shoes with custom molded Plastizote insoles - Patient has completed both IV PICC line and oral ABX as per ID.  ID has signed off - Medically necessary excisional debridement including subcutaneous tissue was performed using a tissue nipper.  Excisional debridement of the necrotic nonviable tissue down to healthier bleeding viable tissue was performed with postdebridement measurement same as pre- -Continue triple antibiotic ointment and a Band-Aid daily - Continue stressed the importance and advised against going barefoot or wearing socks only.  Ideally I would like for her to be wearing her diabetic shoes and insoles.  Patient has diabetic shoes with custom molded Plastizote insoles.  -Return to clinic 4 weeks for follow-up x-ray  Felecia Shelling, [name] [facility]  Dr. Felecia Shelling, [name]    [address] [place], [place] [address]                Office [phone]  Fax [phone]

## 2022-08-31 ENCOUNTER — Ambulatory Visit: Payer: Medicare HMO | Admitting: Podiatry

## 2022-08-31 DIAGNOSIS — E119 Type 2 diabetes mellitus without complications: Secondary | ICD-10-CM

## 2022-08-31 DIAGNOSIS — E0843 Diabetes mellitus due to underlying condition with diabetic autonomic (poly)neuropathy: Secondary | ICD-10-CM

## 2022-08-31 DIAGNOSIS — L97512 Non-pressure chronic ulcer of other part of right foot with fat layer exposed: Secondary | ICD-10-CM

## 2022-08-31 NOTE — Progress Notes (Signed)
   Chief Complaint  Patient presents with   Foot Ulcer    Right great toe f/u, left heel which noticed a few days ago    Subjective:  Patient presents today status post bone biopsy of the right great toe proximal phalanx with underlying ulcer debridement.  DOS: 03/10/2022.  Patient has completed her IV antibiotics that were prescribed by infectious disease via PICC line.  Patient has also completed oral antibiotics prescribed.  Dr. Luciana Axe, infectious disease has signed off from their standpoint for the moment.  She is doing well and applies a Band-Aid to the foot daily.  Patient states that she has been better about going barefoot.  She wears her diabetic shoes and insoles when she is not in the house.  Around the house she wears house slippers.  Past Medical History:  Diagnosis Date   Arthritis    Diabetes mellitus without complication (HCC)    High cholesterol    Hypertension     Past Surgical History:  Procedure Laterality Date   ABDOMINAL AORTOGRAM W/LOWER EXTREMITY N/A 02/03/2022   Procedure: ABDOMINAL AORTOGRAM W/LOWER EXTREMITY;  Surgeon: Cephus Shelling, MD;  [facility]: [facility];  Service: Cardiovascular;  Laterality: N/A;   ABDOMINAL HYSTERECTOMY     BONE BIOPSY Right 03/10/2022   Procedure: BONE BIOPSY;  Surgeon: Felecia Shelling, [name];  [facility]: [facility];  Service: Podiatry;  Laterality: Right;   WOUND DEBRIDEMENT Right 03/10/2022   Procedure: DEBRIDEMENT WOUND/ULCER;  Surgeon: Felecia Shelling, [name];  Location: [facility];  Service: Podiatry;  Laterality: Right;    Allergies  Allergen Reactions   Codeine     Other Reaction(s): Dizziness (intolerance)   Dapagliflozin Other (See Comments)    2017: yeast infection   Latex Itching    RT great toe 05/18/2022   RT great toe 06/15/2022  Objective/Physical Exam Neurovascular status intact.  Continues to be steady improvement.  Today the wound measures approximate 0.2 x 0.2 x 0.2 cm.  It does not probe to bone.  Granular  wound base.  Periwound is intact and somewhat callused  Assessment: 1. s/p bone biopsy right great toe with debridement of ulcer. DOS: 03/10/2022  -Comprehensive diabetic foot exam performed today -Continue diabetic shoes with custom molded Plastizote insoles - Patient has completed both IV PICC line and oral ABX as per ID.  ID has signed off - Medically necessary excisional debridement including subcutaneous tissue was performed using a tissue nipper.  Excisional debridement of the necrotic nonviable tissue down to healthier bleeding viable tissue was performed with postdebridement measurement same as pre- -Continue triple antibiotic ointment and a Band-Aid daily - Continue stressed the importance and advised against going barefoot or wearing socks only.  Ideally I would like for her to be wearing her diabetic shoes and insoles.  Patient has diabetic shoes with custom molded Plastizote insoles.  -Return to clinic 4 weeks for follow-up x-ray  Felecia Shelling, [name] [facility]  Dr. Felecia Shelling, [name]    [address] [place], [place] [address]                Office [phone]  Fax [phone]

## 2022-10-05 ENCOUNTER — Ambulatory Visit: Payer: Medicare HMO | Admitting: Podiatry

## 2022-10-05 DIAGNOSIS — L97512 Non-pressure chronic ulcer of other part of right foot with fat layer exposed: Secondary | ICD-10-CM

## 2022-10-05 DIAGNOSIS — E0843 Diabetes mellitus due to underlying condition with diabetic autonomic (poly)neuropathy: Secondary | ICD-10-CM | POA: Diagnosis not present

## 2022-10-05 NOTE — Progress Notes (Signed)
   No chief complaint on file.   Subjective:  Patient PMHx diabetes mellitus presents today for follow-up evaluation of an ulcer to the right great toe.    Brief history: Status post bone biopsy of the right great toe proximal phalanx with underlying ulcer debridement.  DOS: 03/10/2022.  Patient completed IV antibiotics that were prescribed by infectious disease via PICC line.  Patient has also completed oral antibiotics prescribed.  Dr. Luciana Axe, infectious disease has signed off from their standpoint for the moment.    Past Medical History:  Diagnosis Date   Arthritis    Diabetes mellitus without complication (HCC)    High cholesterol    Hypertension     Past Surgical History:  Procedure Laterality Date   ABDOMINAL AORTOGRAM W/LOWER EXTREMITY N/A 02/03/2022   Procedure: ABDOMINAL AORTOGRAM W/LOWER EXTREMITY;  Surgeon: Cephus Shelling, MD;  [facility]: [facility];  Service: Cardiovascular;  Laterality: N/A;   ABDOMINAL HYSTERECTOMY     BONE BIOPSY Right 03/10/2022   Procedure: BONE BIOPSY;  Surgeon: Felecia Shelling, [name];  Location: [facility];  Service: Podiatry;  Laterality: Right;   WOUND DEBRIDEMENT Right 03/10/2022   Procedure: DEBRIDEMENT WOUND/ULCER;  Surgeon: Felecia Shelling, [name];  Location: [facility];  Service: Podiatry;  Laterality: Right;    Allergies  Allergen Reactions   Codeine     Other Reaction(s): Dizziness (intolerance)   Dapagliflozin Other (See Comments)    2017: yeast infection   Latex Itching    RT great toe 05/18/2022   RT great toe 06/15/2022  Objective/Physical Exam Neurovascular status intact.  Continues to be steady improvement.  Today the wound measures approximate 0.2 x 0.2 x 0.2 cm.  It does not probe to bone.  Granular wound base.  Periwound is intact and somewhat callused  Assessment: 1. s/p bone biopsy right great toe with debridement of ulcer. DOS: 03/10/2022  -Patient evaluated.  Ulcer needs to be very stable.  No erythema or acute changes to  the great toe -Continue diabetic shoes with custom molded Plastizote insoles - Patient has completed both IV PICC line and oral ABX as per ID.  ID has signed off - Medically necessary excisional debridement including subcutaneous tissue was performed using a tissue nipper.  Excisional debridement of the necrotic nonviable tissue down to healthier bleeding viable tissue was performed with postdebridement measurement same as pre- -Continue triple antibiotic ointment and a Band-Aid daily - Continue stressed the importance and advised against going barefoot or wearing socks only.  Ideally I would like for her to be wearing her diabetic shoes and insoles.  Patient has diabetic shoes with custom molded Plastizote insoles.  -Return to clinic 4 weeks for follow-up x-ray  Felecia Shelling, [name] [facility]  Dr. Felecia Shelling, [name]    [address] [place], [place] [address]                Office [phone]  Fax [phone]

## 2022-11-09 ENCOUNTER — Ambulatory Visit: Payer: Medicare HMO | Admitting: Podiatry

## 2022-11-09 ENCOUNTER — Ambulatory Visit (INDEPENDENT_AMBULATORY_CARE_PROVIDER_SITE_OTHER): Payer: Medicare HMO

## 2022-11-09 DIAGNOSIS — L97512 Non-pressure chronic ulcer of other part of right foot with fat layer exposed: Secondary | ICD-10-CM | POA: Diagnosis not present

## 2022-11-09 DIAGNOSIS — E0843 Diabetes mellitus due to underlying condition with diabetic autonomic (poly)neuropathy: Secondary | ICD-10-CM | POA: Diagnosis not present

## 2022-11-09 NOTE — Progress Notes (Signed)
Chief Complaint  Patient presents with   Foot Ulcer     presents today for follow-up of an ulcer to the right great toe. Pt stated that he is getting better but she gets pain when she puts pressure on the foot.    Subjective:  Patient PMHx diabetes mellitus presents today for follow-up evaluation of an ulcer to the right great toe.  No new complaints  Brief history: Status post bone biopsy of the right great toe proximal phalanx with underlying ulcer debridement.  DOS: 03/10/2022.  Patient completed IV antibiotics that were prescribed by infectious disease via PICC line.  Patient has also completed oral antibiotics prescribed.  Dr. Luciana Axe, infectious disease has signed off from their standpoint for the moment.    Past Medical History:  Diagnosis Date   Arthritis    Diabetes mellitus without complication (HCC)    High cholesterol    Hypertension     Past Surgical History:  Procedure Laterality Date   ABDOMINAL AORTOGRAM W/LOWER EXTREMITY N/A 02/03/2022   Procedure: ABDOMINAL AORTOGRAM W/LOWER EXTREMITY;  Surgeon: Cephus Shelling, MD;  [facility];  Service: Cardiovascular;  Laterality: N/A;   ABDOMINAL HYSTERECTOMY     BONE BIOPSY Right 03/10/2022   Procedure: BONE BIOPSY;  Surgeon: Felecia Shelling, [name];  Location: [facility];  Service: Podiatry;  Laterality: Right;   WOUND DEBRIDEMENT Right 03/10/2022   Procedure: DEBRIDEMENT WOUND/ULCER;  Surgeon: Felecia Shelling, [name];  Location: [facility];  Service: Podiatry;  Laterality: Right;    Allergies  Allergen Reactions   Codeine     Other Reaction(s): Dizziness (intolerance)   Dapagliflozin Other (See Comments)    2017: yeast infection   Latex Itching    RT great toe 05/18/2022   RT great toe 06/15/2022   RT great toe 11/09/2022  Objective/Physical Exam Neurovascular status intact.  Wound stable.  measures approximately 0.2 x 0.2 x 0.2 cm.  It does not probe to bone.  Granular wound base.  Periwound is intact and  somewhat callused  Radiographic exam RT foot 11/09/2022 No acute concern for osteomyelitis.  No acute erosions to the great toe joint.  Chronic degenerative changes noted  Assessment: 1. s/p bone biopsy right great toe with debridement of ulcer. DOS: 03/10/2022  -Patient evaluated.  Ulcer needs to be very stable.  No erythema or acute changes to the great toe -Continue diabetic shoes with custom molded Plastizote insoles - Patient has completed both IV PICC line and oral ABX as per ID.  ID has signed off - Medically necessary excisional debridement including subcutaneous tissue was performed using a tissue nipper.  Excisional debridement of the necrotic nonviable tissue down to healthier bleeding viable tissue was performed with postdebridement measurement same as pre- -Continue triple antibiotic ointment and a Band-Aid daily - Continue stressed the importance and advised against going barefoot or wearing socks only.  Ideally I would like for her to be wearing her diabetic shoes and insoles.  Patient has diabetic shoes with custom molded Plastizote insoles.  - Will continue to observe for now.  Return to clinic 6 weeks  Felecia Shelling, [name] [facility]  Dr. Felecia Shelling, [name]    [address], [place] [address]  Office [phone]  Fax [phone]

## 2022-12-21 ENCOUNTER — Encounter: Payer: Self-pay | Admitting: Podiatry

## 2022-12-21 ENCOUNTER — Ambulatory Visit: Payer: Medicare HMO | Admitting: Podiatry

## 2022-12-21 DIAGNOSIS — E0843 Diabetes mellitus due to underlying condition with diabetic autonomic (poly)neuropathy: Secondary | ICD-10-CM | POA: Diagnosis not present

## 2022-12-21 DIAGNOSIS — L97512 Non-pressure chronic ulcer of other part of right foot with fat layer exposed: Secondary | ICD-10-CM | POA: Diagnosis not present

## 2022-12-21 DIAGNOSIS — M79675 Pain in left toe(s): Secondary | ICD-10-CM | POA: Diagnosis not present

## 2022-12-21 DIAGNOSIS — M79674 Pain in right toe(s): Secondary | ICD-10-CM | POA: Diagnosis not present

## 2022-12-21 DIAGNOSIS — B351 Tinea unguium: Secondary | ICD-10-CM | POA: Diagnosis not present

## 2022-12-21 NOTE — Progress Notes (Addendum)
Chief Complaint  Patient presents with   Foot Ulcer    Follow up ulcer hallux right   "I guess it looks alright"   Debridement    Requesting toenail trim    Subjective:  Patient PMHx diabetes mellitus presents today for follow-up evaluation of an ulcer to the right great toe.  No new complaints  Brief history: Status post bone biopsy of the right great toe proximal phalanx with underlying ulcer debridement.  DOS: 03/10/2022.  Patient completed IV antibiotics that were prescribed by infectious disease via PICC line.  Patient has also completed oral antibiotics prescribed.  Dr. Luciana Axe, infectious disease has signed off from their standpoint for the moment.    Past Medical History:  Diagnosis Date   Arthritis    Diabetes mellitus without complication (HCC)    High cholesterol    Hypertension     Past Surgical History:  Procedure Laterality Date   ABDOMINAL AORTOGRAM W/LOWER EXTREMITY N/A 02/03/2022   Procedure: ABDOMINAL AORTOGRAM W/LOWER EXTREMITY;  Surgeon: Cephus Shelling, MD;  Location: [facility];  Service: Cardiovascular;  Laterality: N/A;   ABDOMINAL HYSTERECTOMY     BONE BIOPSY Right 03/10/2022   Procedure: BONE BIOPSY;  Surgeon: Felecia Shelling, [name];  Location: [facility];  Service: Podiatry;  Laterality: Right;   WOUND DEBRIDEMENT Right 03/10/2022   Procedure: DEBRIDEMENT WOUND/ULCER;  Surgeon: Felecia Shelling, [name];  Location: [facility];  Service: Podiatry;  Laterality: Right;    Allergies  Allergen Reactions   Codeine     Other Reaction(s): Dizziness (intolerance)   Dapagliflozin Other (See Comments)    2017: yeast infection   Latex Itching     RT great toe 11/09/2022  Objective/Physical Exam Neurovascular status intact.  The ulcer to the plantar aspect of the great toe is mostly healed.  It only measures approximately 0.1 x 0.1 x 0.1 cm.  Significant improvement since last visit  Hyperkeratotic elongated dystrophic nails also noted 1-5 bilateral.  ABDOMINAL  AORTOGRAM W/LOWER EXTREMITY 02/03/2022 Findings:  Aortogram showed no flow-limiting stenosis in the aortoiliac segment.  Both renal arteries were widely patent. Right lower extremity arteriogram showed a patent common femoral, profunda, SFA, above and below-knee popliteal artery with three-vessel runoff. The peroneal is diminutive with dominant runoff being in the anterior tibial and posterior tibial arteries.  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  No intervention was performed.   Specimen Description WOUND BONE RIGHT GREAT TOE Performed at [facility], [address], [place], [place] [address]  Special Requests NONE Performed at [facility], [address] [place], [place] [address]  Gram Stain WBC PRESENT, PREDOMINANTLY MONONUCLEAR NO ORGANISMS SEEN  Culture No growth aerobically or anaerobically. Performed at [facility], [address], [place], [place] [address]  Report Status 03/15/2022 FINAL  Bone biopsy RT great toe 03/10/2022  Radiographic exam RT foot 11/09/2022 No acute concern for osteomyelitis.  No acute erosions to the great toe joint.  Chronic degenerative changes noted  Assessment: 1. s/p bone biopsy right great toe with debridement of ulcer. DOS: 03/10/2022 2.  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  Ulcer continues to be very stable.  No erythema or acute changes to the great toe -Continue diabetic shoes with custom molded Plastizote insoles - Patient has completed both IV PICC line and oral ABX as per ID.  ID has signed off - Medically necessary excisional debridement including subcutaneous  tissue was performed using a tissue nipper.  Excisional debridement of the necrotic nonviable tissue down to healthier bleeding viable tissue was performed with postdebridement measurement same as pre- -Continue triple antibiotic ointment and a Band-Aid daily - Continue stressed the importance  and advised against going barefoot or wearing socks only.  Ideally I would like for her to be wearing her diabetic shoes and insoles.  Patient has diabetic shoes with custom molded Plastizote insoles.  -Mechanical debridement of nails 1-5 bilateral was performed using a nail nipper without incident or bleeding -Return to clinic 3 months routine footcare  Felecia Shelling, [name] [facility]  Dr. Felecia Shelling, [name]    [address] [place], [place] [address]                Office [phone]  Fax [phone]

## 2023-03-22 ENCOUNTER — Ambulatory Visit: Payer: Medicare HMO | Admitting: Podiatry

## 2023-04-12 ENCOUNTER — Encounter: Payer: Self-pay | Admitting: Podiatry

## 2023-04-12 ENCOUNTER — Ambulatory Visit: Payer: Medicare HMO | Admitting: Podiatry

## 2023-04-12 DIAGNOSIS — M79675 Pain in left toe(s): Secondary | ICD-10-CM

## 2023-04-12 DIAGNOSIS — L97512 Non-pressure chronic ulcer of other part of right foot with fat layer exposed: Secondary | ICD-10-CM

## 2023-04-12 DIAGNOSIS — B351 Tinea unguium: Secondary | ICD-10-CM | POA: Diagnosis not present

## 2023-04-12 DIAGNOSIS — M79674 Pain in right toe(s): Secondary | ICD-10-CM | POA: Diagnosis not present

## 2023-04-12 NOTE — Progress Notes (Signed)
Chief Complaint  Patient presents with   [facility]    RM#[id number]    Subjective:  Patient PMHx diabetes mellitus presents today for follow-up evaluation of an ulcer to the right great toe.  Patient doing well.  She presents today for routine diabetic footcare  Past Medical History:  Diagnosis Date   Arthritis    Diabetes mellitus without complication (HCC)    High cholesterol    Hypertension     Past Surgical History:  Procedure Laterality Date   ABDOMINAL AORTOGRAM W/LOWER EXTREMITY N/A 02/03/2022   Procedure: ABDOMINAL AORTOGRAM W/LOWER EXTREMITY;  Surgeon: Cephus Shelling, MD;  Location: [facility];  Service: Cardiovascular;  Laterality: N/A;   ABDOMINAL HYSTERECTOMY     BONE BIOPSY Right 03/10/2022   Procedure: BONE BIOPSY;  Surgeon: Felecia Shelling, [name];  Location: [facility];  Service: Podiatry;  Laterality: Right;   WOUND DEBRIDEMENT Right 03/10/2022   Procedure: DEBRIDEMENT WOUND/ULCER;  Surgeon: Felecia Shelling, [name];  Location: [facility];  Service: Podiatry;  Laterality: Right;    Allergies  Allergen Reactions   Codeine     Other Reaction(s): Dizziness (intolerance)   Dapagliflozin Other (See Comments)    2017: yeast infection   Latex Itching     RT great toe 11/09/2022  Objective/Physical Exam Neurovascular status intact.  The ulcer to the plantar aspect of the great toe is mostly healed.  It only measures approximately 0.1 x 0.1 x 0.1 cm.  Significant improvement since last visit  Hyperkeratotic elongated dystrophic nails also noted 1-5 bilateral.  ABDOMINAL AORTOGRAM W/LOWER EXTREMITY 02/03/2022 Findings:  Aortogram showed no flow-limiting stenosis in the aortoiliac segment.  Both renal arteries were widely patent. Right lower extremity arteriogram showed a patent common femoral, profunda, SFA, above and below-knee popliteal artery with three-vessel runoff. The peroneal is diminutive with dominant runoff being in the anterior tibial and posterior tibial arteries.   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  No intervention was performed.   Specimen Description WOUND BONE RIGHT GREAT TOE Performed at [facility], [address] [place], [place] [address]  Special Requests NONE Performed at [facility], [address] [place], [place] [address]  Gram Stain WBC PRESENT, PREDOMINANTLY MONONUCLEAR NO ORGANISMS SEEN  Culture No growth aerobically or anaerobically. Performed at [facility], [address], [place], [place] [address]  Report Status 03/15/2022 FINAL  Bone biopsy RT great toe 03/10/2022  Radiographic exam RT foot 11/09/2022 No acute concern for osteomyelitis.  No acute erosions to the great toe joint.  Chronic degenerative changes noted  Assessment: 1. s/p bone biopsy right great toe with debridement of ulcer. DOS: 03/10/2022 2.  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  Pain due to onychomycosis of toenails both  -Patient evaluated.   -With debridement of the callus tissue to the plantar aspect of the right great toe there is healthy underlying skin and no open wound noted. -Continue diabetic shoes with custom molded Plastizote insoles - Mechanical debridement of nails 1-5 bilateral performed using a nail nipper -Return to clinic 3 months routine footcare  Felecia Shelling, [name] [facility]  Dr. Felecia Shelling, [name]    [address]                                    [place],  [place] [address]                Office [phone]  Fax [phone]

## 2023-05-09 IMAGING — CR DG TIBIA/FIBULA 2V*L*
4 series · 4 of 4 positions shown · non-contrast
Comparison: November 19, 2014.

CLINICAL DATA: Possible osteomyelitis.

EXAM:
LEFT TIBIA AND FIBULA - 2 VIEW

[tibia ap (1 of 2)]
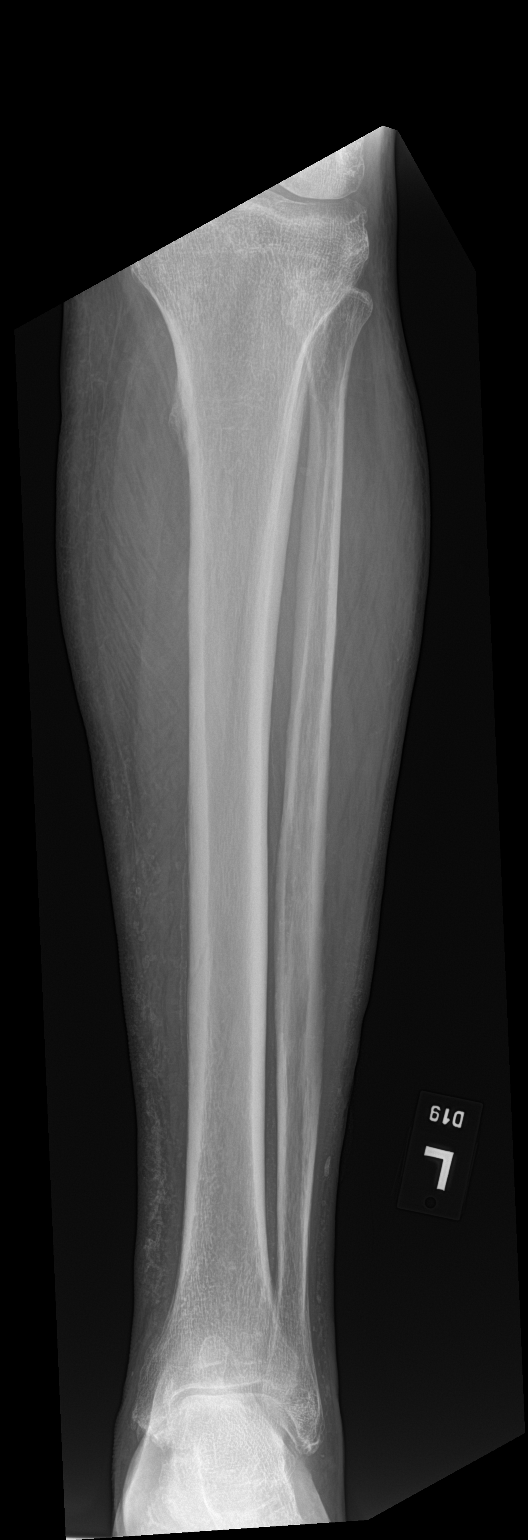

[tibia ap (2 of 2)]
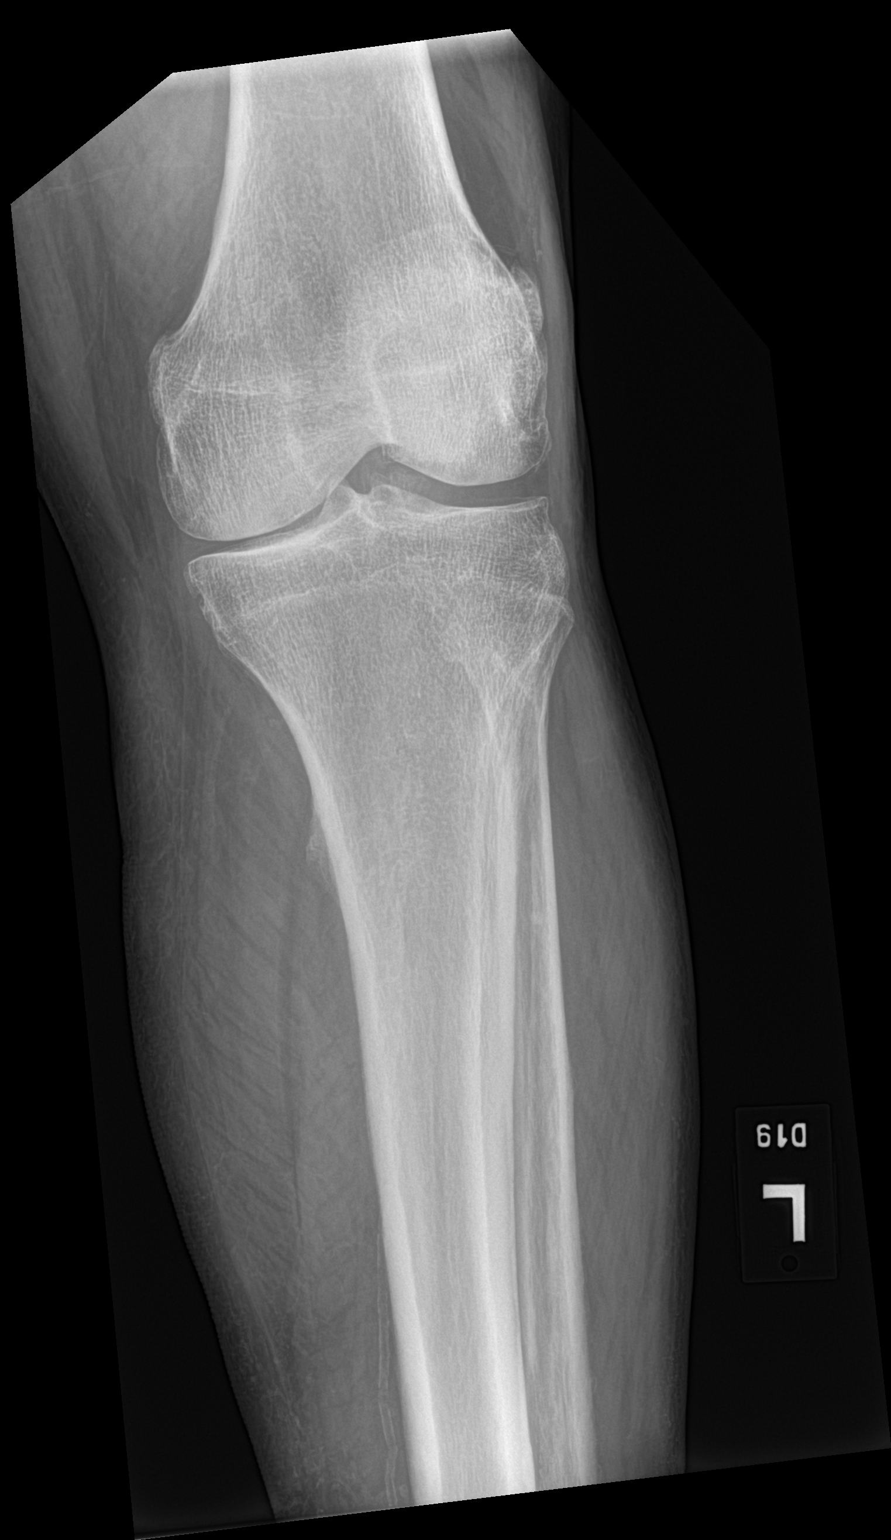

[tibia lat (1 of 2)]
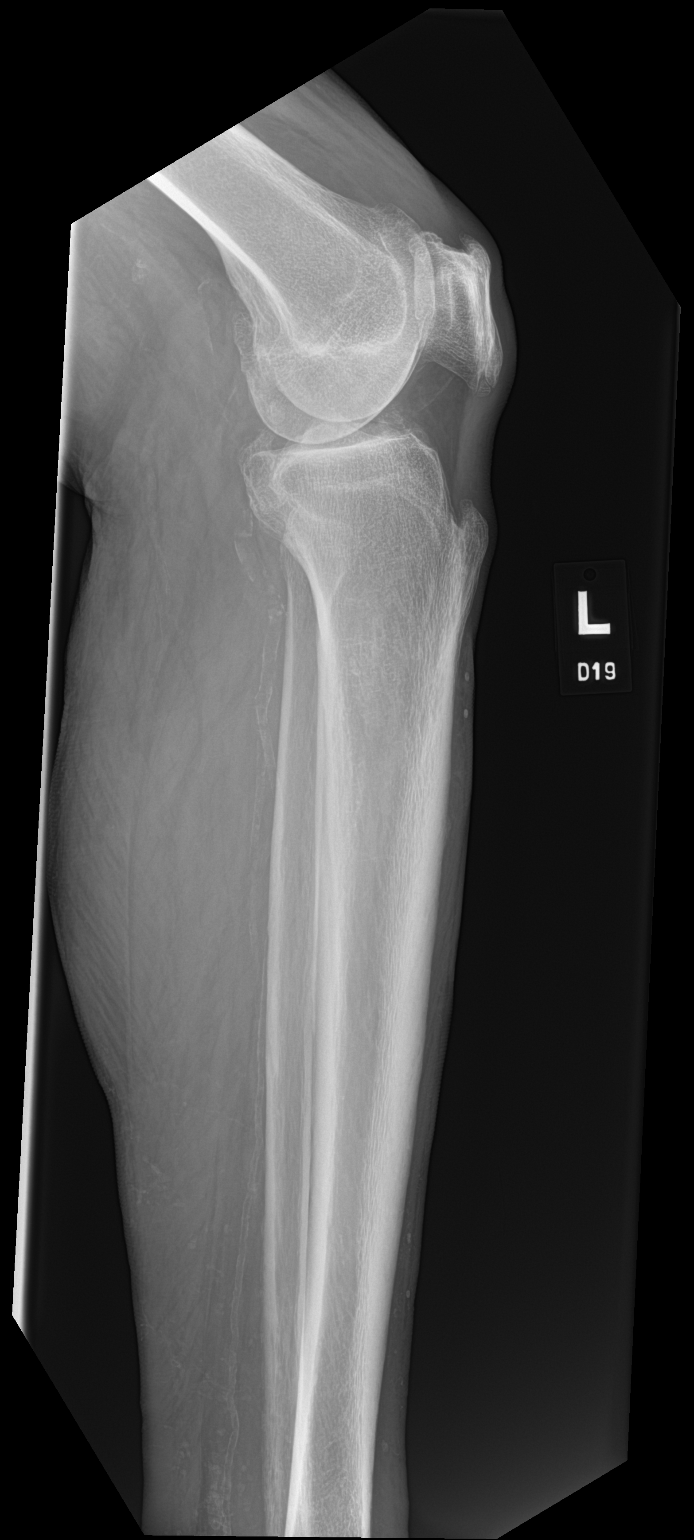

[tibia lat (2 of 2)]
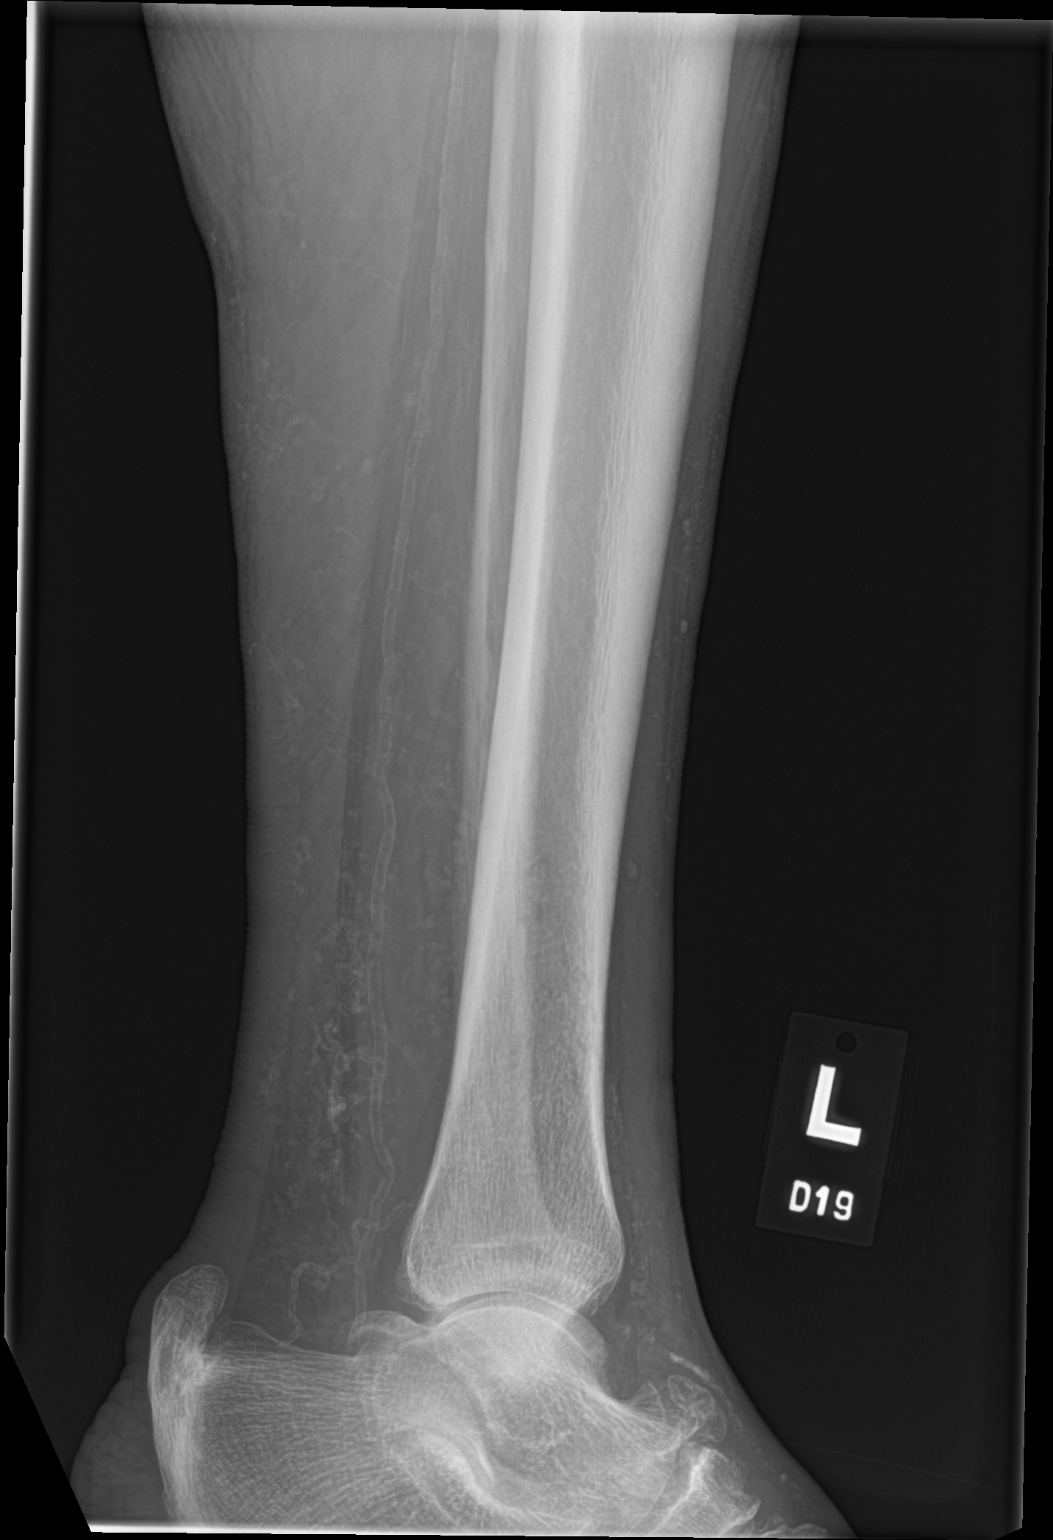

[4 of 4 positions shown; findings below may reference images not displayed]

FINDINGS: There is no evidence of fracture or other focal bone lesions.
Vascular calcifications are noted.
IMPRESSION: No definite bony abnormality is noted.

## 2023-05-17 ENCOUNTER — Ambulatory Visit: Payer: Medicare HMO

## 2023-05-17 DIAGNOSIS — E0843 Diabetes mellitus due to underlying condition with diabetic autonomic (poly)neuropathy: Secondary | ICD-10-CM

## 2023-05-17 DIAGNOSIS — I739 Peripheral vascular disease, unspecified: Secondary | ICD-10-CM

## 2023-05-17 DIAGNOSIS — L97512 Non-pressure chronic ulcer of other part of right foot with fat layer exposed: Secondary | ICD-10-CM

## 2023-05-17 DIAGNOSIS — M2142 Flat foot [pes planus] (acquired), left foot: Secondary | ICD-10-CM

## 2023-05-17 NOTE — Progress Notes (Signed)
 Patient presents to the office today for diabetic shoe and insole measuring.  Patient was measured with brannock device to determine size and width for 1 pair of extra depth shoes and foam casted for 3 pair of insoles.   Documentation of medical necessity will be sent to patient's treating diabetic doctor to verify and sign.   Patient's diabetic provider: Dina Rich  Shoes and insoles will be ordered at that time and patient will be notified for an appointment for fitting when they arrive.   Shoe size (per patient): 12 Brannock measurement: 12 Shoe choice:   [id number] / A720W Shoe size ordered: 12WD  ABN and ppw signed

## 2023-05-24 ENCOUNTER — Ambulatory Visit: Payer: Self-pay | Admitting: Podiatry

## 2023-05-24 ENCOUNTER — Encounter: Payer: Self-pay | Admitting: Podiatry

## 2023-05-24 ENCOUNTER — Ambulatory Visit: Admitting: Podiatry

## 2023-05-24 VITALS — Ht 67.0 in | Wt 230.0 lb

## 2023-05-24 DIAGNOSIS — E0843 Diabetes mellitus due to underlying condition with diabetic autonomic (poly)neuropathy: Secondary | ICD-10-CM

## 2023-05-24 DIAGNOSIS — L84 Corns and callosities: Secondary | ICD-10-CM | POA: Diagnosis not present

## 2023-05-24 DIAGNOSIS — M722 Plantar fascial fibromatosis: Secondary | ICD-10-CM | POA: Diagnosis not present

## 2023-05-24 MED ORDER — TRIAMCINOLONE ACETONIDE 10 MG/ML IJ SUSP
10.0000 mg | Freq: Once | INTRAMUSCULAR | Status: AC
  Administered 2023-05-24: 10 mg via INTRA_ARTICULAR

## 2023-05-24 NOTE — Progress Notes (Signed)
 Subjective:   Patient ID: Carla Stokes, female   DOB: 73 y.o.   MRN: [id number]   HPI Patient presents with pain in the mid arch area right that is inflamed and also does have painful lesions of both feet that at this time they cannot take care of   ROS      Objective:  Physical Exam  Neurovascular status intact inflammation fluid of the medial band of the plantar fascia right painful when pressed with lesions noted to the plantar aspect right and left foot with history of diabetes neurological condition     Assessment:  At risk condition with lesions and inflammatory fasciitis     Plan:  H&P reviewed and went ahead did sterile prep injected the mid fascia right 3 mg Kenalog 5 mg Xylocaine and went ahead and debrided lesion bilateral no iatrogenic bleeding can be done did note a small amount of foreign body of the right which may have been just some she stepped on and I cleaned that out also and advised on soaks

## 2023-07-30 DIAGNOSIS — I639 Cerebral infarction, unspecified: Secondary | ICD-10-CM

## 2023-07-30 HISTORY — DX: Cerebral infarction, unspecified: I63.9

## 2023-08-04 ENCOUNTER — Emergency Department (HOSPITAL_COMMUNITY)

## 2023-08-04 ENCOUNTER — Encounter (HOSPITAL_COMMUNITY): Payer: Self-pay | Admitting: Emergency Medicine

## 2023-08-04 ENCOUNTER — Inpatient Hospital Stay (HOSPITAL_COMMUNITY)
Admission: EM | Admit: 2023-08-04 | Discharge: 2023-08-09 | DRG: 551 | Disposition: A | Discharge status: Home Health | Attending: Family Medicine | Admitting: Family Medicine

## 2023-08-04 ENCOUNTER — Other Ambulatory Visit: Payer: Self-pay

## 2023-08-04 DIAGNOSIS — I10 Essential (primary) hypertension: Secondary | ICD-10-CM

## 2023-08-04 DIAGNOSIS — I6381 Other cerebral infarction due to occlusion or stenosis of small artery: Secondary | ICD-10-CM | POA: Diagnosis present

## 2023-08-04 DIAGNOSIS — I119 Hypertensive heart disease without heart failure: Secondary | ICD-10-CM | POA: Diagnosis present

## 2023-08-04 DIAGNOSIS — Z888 Allergy status to other drugs, medicaments and biological substances status: Secondary | ICD-10-CM

## 2023-08-04 DIAGNOSIS — E7800 Pure hypercholesterolemia, unspecified: Secondary | ICD-10-CM | POA: Diagnosis present

## 2023-08-04 DIAGNOSIS — E1165 Type 2 diabetes mellitus with hyperglycemia: Secondary | ICD-10-CM | POA: Diagnosis present

## 2023-08-04 DIAGNOSIS — M4722 Other spondylosis with radiculopathy, cervical region: Secondary | ICD-10-CM | POA: Diagnosis present

## 2023-08-04 DIAGNOSIS — Z79899 Other long term (current) drug therapy: Secondary | ICD-10-CM

## 2023-08-04 DIAGNOSIS — Z794 Long term (current) use of insulin: Secondary | ICD-10-CM

## 2023-08-04 DIAGNOSIS — I639 Cerebral infarction, unspecified: Secondary | ICD-10-CM | POA: Diagnosis present

## 2023-08-04 DIAGNOSIS — M542 Cervicalgia: Secondary | ICD-10-CM | POA: Diagnosis not present

## 2023-08-04 DIAGNOSIS — E041 Nontoxic single thyroid nodule: Secondary | ICD-10-CM | POA: Diagnosis present

## 2023-08-04 DIAGNOSIS — Z7982 Long term (current) use of aspirin: Secondary | ICD-10-CM

## 2023-08-04 DIAGNOSIS — R29700 NIHSS score 0: Secondary | ICD-10-CM | POA: Diagnosis present

## 2023-08-04 DIAGNOSIS — E785 Hyperlipidemia, unspecified: Secondary | ICD-10-CM

## 2023-08-04 DIAGNOSIS — M4802 Spinal stenosis, cervical region: Principal | ICD-10-CM | POA: Diagnosis present

## 2023-08-04 DIAGNOSIS — Z7984 Long term (current) use of oral hypoglycemic drugs: Secondary | ICD-10-CM

## 2023-08-04 DIAGNOSIS — Z885 Allergy status to narcotic agent status: Secondary | ICD-10-CM

## 2023-08-04 DIAGNOSIS — Z8673 Personal history of transient ischemic attack (TIA), and cerebral infarction without residual deficits: Secondary | ICD-10-CM

## 2023-08-04 DIAGNOSIS — Z91040 Latex allergy status: Secondary | ICD-10-CM

## 2023-08-04 DIAGNOSIS — M4712 Other spondylosis with myelopathy, cervical region: Secondary | ICD-10-CM | POA: Diagnosis present

## 2023-08-04 DIAGNOSIS — M488X2 Other specified spondylopathies, cervical region: Secondary | ICD-10-CM | POA: Diagnosis present

## 2023-08-04 DIAGNOSIS — E66811 Obesity, class 1: Secondary | ICD-10-CM | POA: Diagnosis present

## 2023-08-04 DIAGNOSIS — E119 Type 2 diabetes mellitus without complications: Secondary | ICD-10-CM

## 2023-08-04 DIAGNOSIS — R531 Weakness: Secondary | ICD-10-CM | POA: Diagnosis present

## 2023-08-04 DIAGNOSIS — R270 Ataxia, unspecified: Secondary | ICD-10-CM | POA: Diagnosis present

## 2023-08-04 DIAGNOSIS — Z6834 Body mass index (BMI) 34.0-34.9, adult: Secondary | ICD-10-CM

## 2023-08-04 DIAGNOSIS — Z823 Family history of stroke: Secondary | ICD-10-CM

## 2023-08-04 LAB — CBC WITH DIFFERENTIAL/PLATELET
Abs Immature Granulocytes: 0.02 10*3/uL (ref 0.00–0.07)
Basophils Absolute: 0 10*3/uL (ref 0.0–0.1)
Basophils Relative: 1 %
Eosinophils Absolute: 0.1 10*3/uL (ref 0.0–0.5)
Eosinophils Relative: 2 %
HCT: 42.3 % (ref 36.0–46.0)
Hemoglobin: 14.4 g/dL (ref 12.0–15.0)
Immature Granulocytes: 0 %
Lymphocytes Relative: 26 %
Lymphs Abs: 1.6 10*3/uL (ref 0.7–4.0)
MCH: 28 pg (ref 26.0–34.0)
MCHC: 34 g/dL (ref 30.0–36.0)
MCV: 82.3 fL (ref 80.0–100.0)
Monocytes Absolute: 0.4 10*3/uL (ref 0.1–1.0)
Monocytes Relative: 7 %
Neutro Abs: 3.8 10*3/uL (ref 1.7–7.7)
Neutrophils Relative %: 64 %
Platelets: 187 10*3/uL (ref 150–400)
RBC: 5.14 MIL/uL — ABNORMAL HIGH (ref 3.87–5.11)
RDW: 14.1 % (ref 11.5–15.5)
WBC: 5.9 10*3/uL (ref 4.0–10.5)
nRBC: 0 % (ref 0.0–0.2)

## 2023-08-04 LAB — COMPREHENSIVE METABOLIC PANEL WITH GFR
ALT: 37 U/L (ref 0–44)
AST: 34 U/L (ref 15–41)
Albumin: 4.2 g/dL (ref 3.5–5.0)
Alkaline Phosphatase: 54 U/L (ref 38–126)
Anion gap: 13 (ref 5–15)
BUN: 9 mg/dL (ref 8–23)
CO2: 28 mmol/L (ref 22–32)
Calcium: 9.9 mg/dL (ref 8.9–10.3)
Chloride: 104 mmol/L (ref 98–111)
Creatinine, Ser: 1.05 mg/dL — ABNORMAL HIGH (ref 0.44–1.00)
GFR, Estimated: 57 mL/min — ABNORMAL LOW (ref >60)
Glucose, Bld: 109 mg/dL — ABNORMAL HIGH (ref 70–99)
Potassium: 3.9 mmol/L (ref 3.5–5.1)
Sodium: 145 mmol/L (ref 135–145)
Total Bilirubin: 0.5 mg/dL (ref 0.0–1.2)
Total Protein: 7.1 g/dL (ref 6.5–8.1)

## 2023-08-04 LAB — I-STAT CHEM 8, ED
BUN: 12 mg/dL (ref 8–23)
Calcium, Ion: 1.21 mmol/L (ref 1.15–1.40)
Chloride: 103 mmol/L (ref 98–111)
Creatinine, Ser: 1.1 mg/dL — ABNORMAL HIGH (ref 0.44–1.00)
Glucose, Bld: 106 mg/dL — ABNORMAL HIGH (ref 70–99)
HCT: 42 % (ref 36.0–46.0)
Hemoglobin: 14.3 g/dL (ref 12.0–15.0)
Potassium: 3.8 mmol/L (ref 3.5–5.1)
Sodium: 144 mmol/L (ref 135–145)
TCO2: 29 mmol/L (ref 22–32)

## 2023-08-04 MED ORDER — MORPHINE SULFATE (PF) 4 MG/ML IV SOLN
4.0000 mg | Freq: Once | INTRAVENOUS | Status: AC
  Administered 2023-08-04: 4 mg via INTRAVENOUS
  Filled 2023-08-04: qty 1

## 2023-08-04 MED ORDER — LORAZEPAM 2 MG/ML IJ SOLN: 1.0000 mg | Freq: Once | INTRAMUSCULAR | Status: DC

## 2023-08-04 MED ORDER — IOHEXOL 350 MG/ML SOLN
75.0000 mL | Freq: Once | INTRAVENOUS | Status: AC | PRN
  Administered 2023-08-04: 75 mL via INTRAVENOUS

## 2023-08-04 MED ORDER — METHYLPREDNISOLONE SODIUM SUCC 125 MG IJ SOLR
125.0000 mg | Freq: Once | INTRAMUSCULAR | Status: AC
  Administered 2023-08-04: 125 mg via INTRAVENOUS
  Filled 2023-08-04: qty 2

## 2023-08-04 NOTE — ED Triage Notes (Signed)
 Patient states "I've been sick since mother's day, already have been to the UC, went to hospital in [place], and [facility], and my PCP in [place] for right side neck pain, radiating to back, causing my not to be able to sleep.  My balance is off too.  I need answers."  Patient gives verbal consent for MSE.

## 2023-08-04 NOTE — ED Notes (Signed)
 Patient transported to CT

## 2023-08-04 NOTE — ED Provider Triage Note (Signed)
 Emergency Medicine Provider Triage Evaluation Note  Carla Stokes , a 73 y.o. female  was evaluated in triage.  Pt complains of right sided neck pain for the past few weeks.  Denies any fevers.  Denies any radiation into her arms.  Denies any weakness, numbness, tingling to the arm.  Denies any head pain.  No vision changes.  She has tried muscle laxer's.  Has been to several urgent cares, ERs, and primary care but cannot get any relief of her neck pain.  Denies any fevers.  Review of Systems  Positive:  Negative:   Physical Exam  BP (!) 147/95 (BP Location: Left Arm)   Pulse 61   Resp 16   Wt 99.3 kg   SpO2 99%   BMI 34.30 kg/m  Gen:   Awake, no distress   Resp:  Normal effort  MSK:   Moves extremities without difficulty  Other:  : Palpable paraspinal right muscle spasm.  No midline tenderness of the cervical spine.  I will radial pulses are symmetric.  Compartments are soft and nontender in the bilateral upper extremities.  Medical Decision Making  Medically screening exam initiated at 3:52 PM.  Appropriate orders placed.  Donnelly Gainer was informed that the remainder of the evaluation will be completed by another provider, this initial triage assessment does not replace that evaluation, and the importance of remaining in the ED until their evaluation is complete.  Discussed with attending, will order CTA head and neck and labs.   Spence Dux, [place] 08/04/23 1610

## 2023-08-04 NOTE — ED Provider Notes (Signed)
 [facility] Provider Note   CSN: [id number] Arrival date & time: 08/04/23  1310     History  Chief Complaint  Patient presents with   Neck Pain    Carla Stokes is a 73 y.o. female with past medical history significant for diabetes, hypertension, hyperlipidemia who presents with concern for severe neck pain radiating to bilateral shoulders, affecting balance.  She reports problem has been ongoing since Mother's Day, somewhat worsening.  She has been seen by multiple doctors regarding but is not sure what is causing it.  No previous history of stroke, MS, known neck trauma.   Neck Pain      Home Medications Prior to Admission medications   Medication Sig Start Date End Date Taking? Authorizing Provider  acetaminophen  (TYLENOL ) 500 MG tablet Take 500-1,000 mg by mouth every 6 (six) hours as needed for moderate pain.    [provider]  Alcohol Swabs (B-D SINGLE USE SWABS REGULAR) PADS  09/13/18   [provider]  amLODipine (NORVASC) 5 MG tablet Take 5 mg by mouth daily.    [provider]  aspirin  EC 81 MG tablet Take 81 mg by mouth daily. Swallow whole.    [provider]  gentamicin  cream (GARAMYCIN ) 0.1 % Apply 1 Application topically 2 (two) times daily. 11/19/21   Dot Gazella, [name]  gentamicin  ointment (GARAMYCIN ) 0.1 % Apply 1 Application topically in the morning and at bedtime. 12/09/21   Standiford, Karlene Overcast, [name]  insulin glargine, 1 Unit Dial, (TOUJEO SOLOSTAR) 300 UNIT/ML Solostar Pen Inject 80 Units into the skin at bedtime.    [provider]  metFORMIN (GLUCOPHAGE) 1000 MG tablet Take 1,000 mg by mouth 2 (two) times daily with a meal. 01/12/22   [provider]  Multiple Vitamin (MULTIVITAMIN WITH MINERALS) TABS tablet Take 1 tablet by mouth daily.    [provider]  OZEMPIC, 2 MG/DOSE, 8 MG/3ML SOPN SMARTSIG:0.75 Milliliter(s) SUB-Q Once a Week 08/16/22    [provider]  povidone-iodine  (BETADINE) 10 % ointment Apply 1 Application topically daily.    [provider]  rosuvastatin (CRESTOR) 20 MG tablet Take 20 mg by mouth daily. 01/12/22   [provider]  TRULICITY 3 MG/0.5ML SOPN Inject 3 mg into the skin every Tuesday. 01/12/22   [provider]  valsartan-hydrochlorothiazide (DIOVAN-HCT) 320-25 MG tablet Take 1 tablet by mouth daily. 01/12/22   [provider]  zinc gluconate 50 MG tablet Take 50 mg by mouth daily.    [provider]      Allergies    Codeine, Dapagliflozin, and Latex    Review of Systems   Review of Systems  Musculoskeletal:  Positive for neck pain.  All other systems reviewed and are negative.   Physical Exam Updated Vital Signs BP (!) 165/83   Pulse 69   Temp 97.9 F (36.6 C) (Oral)   Resp 18   Wt 99.3 kg   SpO2 100%   BMI 34.30 kg/m  Physical Exam Vitals and nursing note reviewed.  Constitutional:      General: She is not in acute distress.    Appearance: Normal appearance.  HENT:     Head: Normocephalic and atraumatic.  Eyes:     General:        Right eye: No discharge.        Left eye: No discharge.  Cardiovascular:     Rate and Rhythm: Normal rate and regular rhythm.  Pulses: Normal pulses.     Heart sounds: No murmur heard.    No friction rub. No gallop.  Pulmonary:     Effort: Pulmonary effort is normal.     Breath sounds: Normal breath sounds.  Abdominal:     General: Bowel sounds are normal.     Palpations: Abdomen is soft.  Musculoskeletal:     Comments: Focal tenderness in the cervical spinal region.  She does have intact strength, and sensation upper and lower extremities, but some unsteadiness with gait on attempted ambulation, slightly shuffling but is able to ambulate without assistance.  Reports she walks without assistance at home.  Skin:    General: Skin is warm and dry.     Capillary Refill: Capillary refill takes  less than 2 seconds.  Neurological:     Mental Status: She is alert and oriented to person, place, and time.     Comments: Cranial nerves II through XII grossly intact.  Intact finger-nose, intact heel-to-shin.  Romberg negative, gait somewhat shuffling but otherwise normal.  Alert and oriented x3.  Moves all 4 limbs spontaneously, normal coordination.  No pronator drift.  Intact strength 5 out of 5 bilateral upper and lower extremities.    Psychiatric:        Mood and Affect: Mood normal.        Behavior: Behavior normal.     ED Results / Procedures / Treatments   Labs (all labs ordered are listed, but only abnormal results are displayed) Labs Reviewed  CBC WITH DIFFERENTIAL/PLATELET - Abnormal; Notable for the following components:      Result Value   RBC 5.14 (*)    All other components within normal limits  COMPREHENSIVE METABOLIC PANEL WITH GFR - Abnormal; Notable for the following components:   Glucose, Bld 109 (*)    Creatinine, Ser 1.05 (*)    GFR, Estimated 57 (*)    All other components within normal limits  I-STAT CHEM 8, ED - Abnormal; Notable for the following components:   Creatinine, Ser 1.10 (*)    Glucose, Bld 106 (*)    All other components within normal limits    EKG None  Radiology MR BRAIN WO CONTRAST Result Date: 08/04/2023 CLINICAL DATA:  Acute neurologic deficit EXAM: MRI HEAD WITHOUT CONTRAST MRI CERVICAL SPINE WITHOUT CONTRAST TECHNIQUE: Multiplanar, multiecho pulse sequences of the brain and surrounding structures, and cervical spine, to include the craniocervical junction and cervicothoracic junction, were obtained without intravenous contrast. COMPARISON:  CTA head neck 08/04/2023 FINDINGS: MRI HEAD FINDINGS Brain: There is hyperintensity on diffusion-weighted imaging within the right centrum semiovale. No associated ADC correlate. Chronic microhemorrhage in the medial left temporal lobe. There is multifocal hyperintense T2-weighted signal within the  white matter. Parenchymal volume and CSF spaces are normal. The midline structures are normal. Vascular: Normal flow voids. Skull and upper cervical spine: Normal calvarium and skull base. Visualized upper cervical spine and soft tissues are normal. Sinuses/Orbits:No paranasal sinus fluid levels or advanced mucosal thickening. No mastoid or middle ear effusion. Normal orbits. MRI CERVICAL SPINE FINDINGS Alignment: Grade 1 retrolisthesis at C5-6 Vertebrae: No acute abnormality. There is flowing anterior osteophytosis at C4-6. Cord: Normal signal and morphology. Posterior Fossa, vertebral arteries, paraspinal tissues: Negative. Disc levels: C1-2: Unremarkable. 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 Severe spinal canal stenosis with compression of the spinal cord. No neural foraminal stenosis. C3-4: Ossification of the posterior longitudinal ligament. Severe spinal canal stenosis. There is compression of the spinal cord. Severe bilateral  neural foraminal stenosis. C4-5: Ossification of posterior longitudinal ligament. Severe spinal canal stenosis. There is compression of the spinal cord. Severe bilateral neural foraminal stenosis. C5-6: Ossification of the posterior longitudinal ligament. Severe spinal canal stenosis. Spinal cord compression. Severe bilateral neural foraminal stenosis. C6-7: Small disc bulge. Moderate spinal canal stenosis. Severe left neural foraminal stenosis. C7-T1: Intermediate sized central disc protrusion. There is no spinal canal stenosis. No neural foraminal stenosis. IMPRESSION: 1. 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 No acute intracranial abnormality. 2. Severe spinal canal stenosis with compression of the spinal cord at C2-3, C3-4, C4-5 and C5-6. 3. Severe bilateral neural foraminal stenosis at C3-4, C4-5 and C5-6. 4. Severe left C6-7 neural foraminal stenosis. Electronically Signed   By: Juanetta Nordmann M.D.   On: 08/04/2023 21:48   MR Cervical Spine Wo  Contrast Result Date: 08/04/2023 CLINICAL DATA:  Acute neurologic deficit EXAM: MRI HEAD WITHOUT CONTRAST MRI CERVICAL SPINE WITHOUT CONTRAST TECHNIQUE: Multiplanar, multiecho pulse sequences of the brain and surrounding structures, and cervical spine, to include the craniocervical junction and cervicothoracic junction, were obtained without intravenous contrast. COMPARISON:  CTA head neck 08/04/2023 FINDINGS: MRI HEAD FINDINGS Brain: There is hyperintensity on diffusion-weighted imaging within the right centrum semiovale. No associated ADC correlate. Chronic microhemorrhage in the medial left temporal lobe. There is multifocal hyperintense T2-weighted signal within the white matter. Parenchymal volume and CSF spaces are normal. The midline structures are normal. Vascular: Normal flow voids. Skull and upper cervical spine: Normal calvarium and skull base. Visualized upper cervical spine and soft tissues are normal. Sinuses/Orbits:No paranasal sinus fluid levels or advanced mucosal thickening. No mastoid or middle ear effusion. Normal orbits. MRI CERVICAL SPINE FINDINGS Alignment: Grade 1 retrolisthesis at C5-6 Vertebrae: No acute abnormality. There is flowing anterior osteophytosis at C4-6. Cord: Normal signal and morphology. Posterior Fossa, vertebral arteries, paraspinal tissues: Negative. Disc levels: C1-2: Unremarkable. 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 Severe spinal canal stenosis with compression of the spinal cord. No neural foraminal stenosis. C3-4: Ossification of the posterior longitudinal ligament. Severe spinal canal stenosis. There is compression of the spinal cord. Severe bilateral neural foraminal stenosis. C4-5: Ossification of posterior longitudinal ligament. Severe spinal canal stenosis. There is compression of the spinal cord. Severe bilateral neural foraminal stenosis. C5-6: Ossification of the posterior longitudinal ligament. Severe spinal canal stenosis. Spinal cord  compression. Severe bilateral neural foraminal stenosis. C6-7: Small disc bulge. Moderate spinal canal stenosis. Severe left neural foraminal stenosis. C7-T1: Intermediate sized central disc protrusion. There is no spinal canal stenosis. No neural foraminal stenosis. IMPRESSION: 1. 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 No acute intracranial abnormality. 2. Severe spinal canal stenosis with compression of the spinal cord at C2-3, C3-4, C4-5 and C5-6. 3. Severe bilateral neural foraminal stenosis at C3-4, C4-5 and C5-6. 4. Severe left C6-7 neural foraminal stenosis. Electronically Signed   By: Juanetta Nordmann M.D.   On: 08/04/2023 21:48   CT ANGIO HEAD NECK W WO CM Result Date: 08/04/2023 CLINICAL DATA:  Right-sided neck pain, pain radiating to back causing difficulty sleeping, balance difficulty. EXAM: CT ANGIOGRAPHY HEAD AND NECK WITH AND WITHOUT CONTRAST TECHNIQUE: Multidetector CT imaging of the head and neck was performed using the standard protocol during bolus administration of intravenous contrast. Multiplanar CT image reconstructions and MIPs were obtained to evaluate the vascular anatomy. Carotid stenosis measurements (when applicable) are obtained utilizing NASCET criteria, using the distal internal carotid diameter as the denominator. RADIATION DOSE REDUCTION: This exam was performed according to the departmental  dose-optimization program which includes automated exposure control, adjustment of the mA and/or kV according to patient size and/or use of iterative reconstruction technique. CONTRAST:  75mL OMNIPAQUE IOHEXOL 350 MG/ML SOLN COMPARISON:  None Available. FINDINGS: CT HEAD FINDINGS Brain: No acute intracranial hemorrhage. No CT evidence of acute infarct. No edema, mass effect, or midline shift. The basilar cisterns are patent. Ventricles: Ventricles are normal in size and configuration. Vascular: No hyperdense vessel. Intracranial atherosclerotic calcifications noted. Skull: No acute  or aggressive finding. Sinuses/orbits: Left lens replacement. Orbits otherwise unremarkable. Mucous retention cyst in the partially visualized left maxillary sinus. Other: Mastoid air cells are clear. CTA NECK FINDINGS Aortic arch: Common origin of the brachiocephalic and left common carotid arteries. Imaged portion shows no evidence of aneurysm or dissection. No significant stenosis of the major arch vessel origins. Pulmonary arteries: As permitted by contrast timing, there are no filling defects in the visualized pulmonary arteries. Subclavian arteries: Patent bilaterally. Mild atherosclerosis at the origin of the brachiocephalic artery and along the proximal right subclavian without significant stenosis. Additional mild atherosclerosis at the origin of the left subclavian artery without significant stenosis. Right carotid system: No evidence of dissection, stenosis (50% or greater), or occlusion. Minimal calcified atherosclerosis at the carotid bifurcation. Left carotid system: No evidence of dissection, stenosis (50% or greater), or occlusion. Minimal calcified atherosclerosis at the carotid bifurcation. Vertebral arteries: Codominant. Patent from the origins to the vertebrobasilar confluence. No evidence of occlusion or dissection. Atherosclerosis involving the left vertebral artery at the V3/V4 junction extending into the proximal V4 segment resulting in mild stenosis. Skeleton: No acute findings. Degenerative changes in the cervical spine. 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 There is significant spinal canal stenosis appreciated at C2-3, C3-4, C4-5, and C5-6. There is additional ossification of the posterior longitudinal ligament at C7-T1 with at least mild spinal canal stenosis. Facet arthrosis at multiple levels in the cervical spine there is significant foraminal narrowing at multiple levels particularly at C3-4 and C4-5. Other  neck: The visualized airway is patent. No cervical lymphadenopathy. Large right thyroid nodule measuring at least 3.3 cm in diameter. Upper chest: Visualized lung apices are clear. Review of the MIP images confirms the above findings CTA HEAD FINDINGS ANTERIOR CIRCULATION: The intracranial ICAs are patent bilaterally. Mild atherosclerosis of the bilateral carotid siphons. Mild stenosis of the bilateral cavernous ICAs. Additional moderate stenosis of the left supraclinoid ICA. No proximal occlusion, aneurysm, or vascular malformation. MCAs: The middle cerebral arteries are patent bilaterally. ACAs: The anterior cerebral arteries are patent bilaterally. POSTERIOR CIRCULATION: No significant stenosis, proximal occlusion, aneurysm, or vascular malformation. PCAs: The posterior cerebral arteries are patent bilaterally. Pcomm: Not well visualized SCAs: The superior cerebellar arteries are patent bilaterally. Basilar artery: Patent AICAs: Patent PICAs: Patent Vertebral arteries: As above. Venous sinuses: As permitted by contrast timing, patent. Anatomic variants: None Review of the MIP images confirms the above findings IMPRESSION: No large vessel occlusion. No CT evidence of acute intracranial abnormality. No significant atherosclerosis of the arteries in the neck. 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 Atherosclerosis involving the left vertebral artery at the V3/V4 junction and proximal V4 segment resulting in mild stenosis. Significant degenerative changes of the cervical spine along with ossification of the posterior longitudinal ligament. Significant osseous spinal canal narrowing at multiple levels. Additional significant osseous foraminal narrowing. Recommend correlation with MRI cervical spine.  Right thyroid nodule measuring up to 3.3 cm. Recommend nonemergent correlation with thyroid ultrasound if not previously  performed. Electronically Signed   By: Denny Flack M.D.   On: 08/04/2023 17:58    Procedures Procedures    Medications Ordered in ED Medications  LORazepam (ATIVAN) injection 1 mg (has no administration in time range)  methylPREDNISolone sodium succinate (SOLU-MEDROL) 125 mg/2 mL injection 125 mg (has no administration in time range)  morphine (PF) 4 MG/ML injection 4 mg (has no administration in time range)  iohexol (OMNIPAQUE) 350 MG/ML injection 75 mL (75 mLs Intravenous Contrast Given 08/04/23 1716)  morphine (PF) 4 MG/ML injection 4 mg (4 mg Intravenous Given 08/04/23 2032)    ED Course/ Medical Decision Making/ A&P Clinical Course as of 08/04/23 2349  Fri Aug 04, 2023  [address] -- medrol dosepak [CP]    Clinical Course User Index [CP] Nelly Banco, PA-C                                 Medical Decision Making Amount and/or Complexity of Data Reviewed Radiology: ordered.  Risk Prescription drug management.   This patient is a 73 y.o. female  who presents to the ED for concern of neck pain, dizziness, wobbly gait.   Differential diagnoses prior to evaluation: The emergent differential diagnosis includes, but is not limited to,  BPPV, vestibular migraine, head trauma, AVM, intracranial tumor, multiple sclerosis, drug-related, CVA, orthostatic hypotension, sepsis, hypoglycemia, electrolyte disturbance, anemia, anxiety --for her neck pain considered cervical stenosis, fracture, dislocation, especially want to evaluate for unstable cervical spinal pathology. This is not an exhaustive differential.   Past Medical History / Co-morbidities / Social History: Hypertension, diabetes, arthritis, hyperlipidemia  Physical Exam: Physical exam performed. The pertinent findings include: Cranial nerves II through XII grossly intact.  Intact finger-nose, intact heel-to-shin.  Romberg negative, gait somewhat shuffling but otherwise normal.  Alert and oriented x3.  Moves all  4 limbs spontaneously, normal coordination.  No pronator drift.  Intact strength 5 out of 5 bilateral upper and lower extremities. Focal tenderness in the cervical spinal region.  She does have intact strength, and sensation upper and lower extremities, but some unsteadiness with gait on attempted ambulation, slightly shuffling but is able to ambulate without assistance.  Reports she walks without assistance at home.   Vital signs stable other than hypertension, blood pressure 165/83   Lab Tests/Imaging studies: I personally interpreted labs/imaging and the pertinent results include: CBC overall unremarkable, CMP with mildly elevated creatinine 1.05, otherwise unremarkable.  I dependently interpreted CT angio head and neck which shows some mild stenosis of proximal V4 segment, fairly severe cervical stenosis, with recommendation for follow-up MRI, MRI brain and C-spine shows severe spinal stenosis with compression of cord from C2-C6, neuroforaminal stenosis at C5/C6, she has a subacute infarct noted at right centrum semiovale. I agree with the radiologist interpretation.    Medications: I ordered medication including morphine for pain, Ativan for anxiety.  I have reviewed the patients home medicines and have made adjustments as needed.   Consults: Spoke with the neurosurgeon, Dr. Larrie Po who agreed no emergent hospitalization needed for the spinal pathology, recommend close office follow-up, Medrol Dosepak.  Spoke with neurologist, Dr. Murvin Arthurs who reports symptoms are not consistent with being from the subacute stroke noted, but nonetheless patient would benefit from admission for stroke workup. Recommends medicine admission, Dr. Michell Ahumada accepts.  Disposition: After consideration of  the diagnostic results and the patients response to treatment, I feel that patient would benefit from admission as discussed above   Final Clinical Impression(s) / ED Diagnoses Final diagnoses:  Cervical stenosis  of spinal canal  Cerebrovascular accident (CVA), unspecified mechanism [facility])    Rx / DC Orders ED Discharge Orders     None         Nelly Banco, PA-C 08/04/23 2349    Kingsley, Victoria K, DO 08/04/23 2356

## 2023-08-04 NOTE — ED Notes (Signed)
 Patient back to waiting

## 2023-08-05 ENCOUNTER — Observation Stay (HOSPITAL_COMMUNITY)

## 2023-08-05 ENCOUNTER — Observation Stay (HOSPITAL_BASED_OUTPATIENT_CLINIC_OR_DEPARTMENT_OTHER)

## 2023-08-05 DIAGNOSIS — G9520 Unspecified cord compression: Secondary | ICD-10-CM

## 2023-08-05 DIAGNOSIS — I6389 Other cerebral infarction: Secondary | ICD-10-CM | POA: Diagnosis not present

## 2023-08-05 DIAGNOSIS — E785 Hyperlipidemia, unspecified: Secondary | ICD-10-CM

## 2023-08-05 DIAGNOSIS — E1165 Type 2 diabetes mellitus with hyperglycemia: Secondary | ICD-10-CM

## 2023-08-05 DIAGNOSIS — E1151 Type 2 diabetes mellitus with diabetic peripheral angiopathy without gangrene: Secondary | ICD-10-CM | POA: Diagnosis not present

## 2023-08-05 DIAGNOSIS — I10 Essential (primary) hypertension: Secondary | ICD-10-CM

## 2023-08-05 DIAGNOSIS — R29700 NIHSS score 0: Secondary | ICD-10-CM | POA: Diagnosis not present

## 2023-08-05 DIAGNOSIS — M4802 Spinal stenosis, cervical region: Principal | ICD-10-CM

## 2023-08-05 DIAGNOSIS — I639 Cerebral infarction, unspecified: Secondary | ICD-10-CM | POA: Diagnosis present

## 2023-08-05 DIAGNOSIS — Z7982 Long term (current) use of aspirin: Secondary | ICD-10-CM

## 2023-08-05 DIAGNOSIS — E119 Type 2 diabetes mellitus without complications: Secondary | ICD-10-CM

## 2023-08-05 DIAGNOSIS — E041 Nontoxic single thyroid nodule: Secondary | ICD-10-CM

## 2023-08-05 LAB — ECHOCARDIOGRAM COMPLETE
AR max vel: 2.65 cm2
AV Area VTI: 2.89 cm2
AV Area mean vel: 2.66 cm2
AV Mean grad: 4 mmHg
AV Peak grad: 7.1 mmHg
Ao pk vel: 1.33 m/s
Area-P 1/2: 3.21 cm2
S' Lateral: 2.4 cm
Weight: 3504 [oz_av]

## 2023-08-05 LAB — HIV ANTIBODY (ROUTINE TESTING W REFLEX): HIV Screen 4th Generation wRfx: NONREACTIVE

## 2023-08-05 LAB — LIPID PANEL
Cholesterol: 106 mg/dL (ref 0–200)
HDL: 48 mg/dL (ref >40)
LDL Cholesterol: 49 mg/dL (ref 0–99)
Total CHOL/HDL Ratio: 2.2 ratio
Triglycerides: 45 mg/dL (ref <150)
VLDL: 9 mg/dL (ref 0–40)

## 2023-08-05 LAB — GLUCOSE, CAPILLARY
Glucose-Capillary: 198 mg/dL — ABNORMAL HIGH (ref 70–99)
Glucose-Capillary: 199 mg/dL — ABNORMAL HIGH (ref 70–99)
Glucose-Capillary: 244 mg/dL — ABNORMAL HIGH (ref 70–99)
Glucose-Capillary: 290 mg/dL — ABNORMAL HIGH (ref 70–99)

## 2023-08-05 LAB — RAPID URINE DRUG SCREEN, HOSP PERFORMED
Amphetamines: NOT DETECTED
Barbiturates: NOT DETECTED
Benzodiazepines: NOT DETECTED
Cocaine: NOT DETECTED
Opiates: POSITIVE — AB
Tetrahydrocannabinol: NOT DETECTED

## 2023-08-05 LAB — TSH: TSH: 0.517 u[IU]/mL (ref 0.350–4.500)

## 2023-08-05 LAB — T4, FREE: Free T4: 0.91 ng/dL (ref 0.61–1.12)

## 2023-08-05 LAB — MRSA NEXT GEN BY PCR, NASAL: MRSA by PCR Next Gen: NOT DETECTED

## 2023-08-05 MED ORDER — INSULIN GLARGINE (1 UNIT DIAL) 300 UNIT/ML ~~LOC~~ SOPN: 80.0000 [IU] | PEN_INJECTOR | Freq: Every day | SUBCUTANEOUS | Status: DC

## 2023-08-05 MED ORDER — INSULIN ASPART 100 UNIT/ML IJ SOLN
0.0000 [IU] | Freq: Three times a day (TID) | INTRAMUSCULAR | Status: DC
  Administered 2023-08-05: 2 [IU] via SUBCUTANEOUS
  Administered 2023-08-05: 3 [IU] via SUBCUTANEOUS
  Administered 2023-08-05: 2 [IU] via SUBCUTANEOUS
  Administered 2023-08-06: 1 [IU] via SUBCUTANEOUS
  Administered 2023-08-06: 2 [IU] via SUBCUTANEOUS
  Administered 2023-08-06: 3 [IU] via SUBCUTANEOUS
  Administered 2023-08-07: 2 [IU] via SUBCUTANEOUS
  Administered 2023-08-07 – 2023-08-08 (×2): 3 [IU] via SUBCUTANEOUS
  Administered 2023-08-08: 1 [IU] via SUBCUTANEOUS
  Administered 2023-08-08: 2 [IU] via SUBCUTANEOUS
  Administered 2023-08-09 (×2): 1 [IU] via SUBCUTANEOUS

## 2023-08-05 MED ORDER — STROKE: EARLY STAGES OF RECOVERY BOOK
Freq: Once | Status: AC
  Filled 2023-08-05: qty 1

## 2023-08-05 MED ORDER — SENNOSIDES-DOCUSATE SODIUM 8.6-50 MG PO TABS
1.0000 | ORAL_TABLET | Freq: Once | ORAL | Status: AC
  Administered 2023-08-06: 1 via ORAL
  Filled 2023-08-05: qty 1

## 2023-08-05 MED ORDER — PERFLUTREN LIPID MICROSPHERE
1.0000 mL | INTRAVENOUS | Status: AC | PRN
  Administered 2023-08-05: 2 mL via INTRAVENOUS

## 2023-08-05 MED ORDER — ACETAMINOPHEN 650 MG RE SUPP
650.0000 mg | Freq: Four times a day (QID) | RECTAL | Status: DC | PRN
Start: 2023-08-05 — End: 2023-08-09

## 2023-08-05 MED ORDER — ACETAMINOPHEN 160 MG/5ML PO SOLN: 650.0000 mg | Freq: Four times a day (QID) | ORAL | Status: DC | PRN

## 2023-08-05 MED ORDER — OXYCODONE HCL 5 MG PO TABS
5.0000 mg | ORAL_TABLET | Freq: Four times a day (QID) | ORAL | Status: AC | PRN
  Administered 2023-08-06: 5 mg via ORAL
  Filled 2023-08-05: qty 1

## 2023-08-05 MED ORDER — LIDOCAINE 5 % EX PTCH
1.0000 | MEDICATED_PATCH | CUTANEOUS | Status: DC
  Administered 2023-08-05 – 2023-08-09 (×5): 1 via TRANSDERMAL
  Filled 2023-08-05 (×5): qty 1

## 2023-08-05 MED ORDER — INSULIN GLARGINE-YFGN 100 UNIT/ML ~~LOC~~ SOLN
80.0000 [IU] | Freq: Every day | SUBCUTANEOUS | Status: DC
  Administered 2023-08-05 – 2023-08-08 (×4): 80 [IU] via SUBCUTANEOUS
  Filled 2023-08-05 (×5): qty 0.8

## 2023-08-05 MED ORDER — HYDROCHLOROTHIAZIDE 25 MG PO TABS: 25.0000 mg | ORAL_TABLET | Freq: Every day | ORAL | Status: DC

## 2023-08-05 MED ORDER — METHOCARBAMOL 750 MG PO TABS
750.0000 mg | ORAL_TABLET | Freq: Three times a day (TID) | ORAL | Status: DC | PRN
  Administered 2023-08-06 – 2023-08-09 (×7): 750 mg via ORAL
  Filled 2023-08-05 (×7): qty 1

## 2023-08-05 MED ORDER — INSULIN ASPART 100 UNIT/ML IJ SOLN
0.0000 [IU] | Freq: Every day | INTRAMUSCULAR | Status: DC
  Administered 2023-08-05: 3 [IU] via SUBCUTANEOUS
  Administered 2023-08-07 – 2023-08-08 (×2): 2 [IU] via SUBCUTANEOUS

## 2023-08-05 MED ORDER — AMLODIPINE BESYLATE 5 MG PO TABS
5.0000 mg | ORAL_TABLET | Freq: Every day | ORAL | Status: DC
  Administered 2023-08-05 – 2023-08-09 (×5): 5 mg via ORAL
  Filled 2023-08-05 (×5): qty 1

## 2023-08-05 MED ORDER — ACETAMINOPHEN 325 MG PO TABS
650.0000 mg | ORAL_TABLET | Freq: Four times a day (QID) | ORAL | Status: DC | PRN
  Administered 2023-08-05 – 2023-08-06 (×2): 650 mg via ORAL
  Filled 2023-08-05 (×2): qty 2

## 2023-08-05 MED ORDER — ASPIRIN 81 MG PO TBEC
81.0000 mg | DELAYED_RELEASE_TABLET | Freq: Every day | ORAL | Status: DC
  Administered 2023-08-05 – 2023-08-09 (×5): 81 mg via ORAL
  Filled 2023-08-05 (×5): qty 1

## 2023-08-05 MED ORDER — ROSUVASTATIN CALCIUM 20 MG PO TABS
20.0000 mg | ORAL_TABLET | Freq: Every day | ORAL | Status: DC
  Administered 2023-08-05 – 2023-08-09 (×5): 20 mg via ORAL
  Filled 2023-08-05 (×5): qty 1

## 2023-08-05 MED ORDER — INSULIN ASPART 100 UNIT/ML IJ SOLN: 0.0000 [IU] | Freq: Three times a day (TID) | INTRAMUSCULAR | Status: DC

## 2023-08-05 MED ORDER — VALSARTAN-HYDROCHLOROTHIAZIDE 320-25 MG PO TABS: 1.0000 | ORAL_TABLET | Freq: Every day | ORAL | Status: DC

## 2023-08-05 MED ORDER — NALOXONE HCL 0.4 MG/ML IJ SOLN: 0.4000 mg | INTRAMUSCULAR | Status: DC | PRN

## 2023-08-05 MED ORDER — IRBESARTAN 300 MG PO TABS: 300.0000 mg | ORAL_TABLET | Freq: Every day | ORAL | Status: DC

## 2023-08-05 MED ORDER — MORPHINE SULFATE (PF) 2 MG/ML IV SOLN
1.0000 mg | INTRAVENOUS | Status: DC | PRN
  Administered 2023-08-06: 1 mg via INTRAVENOUS
  Filled 2023-08-05: qty 1

## 2023-08-05 NOTE — Progress Notes (Signed)
 Echocardiogram 2D Echocardiogram has been performed.  [name] N Farrel Guimond,RDCS 08/05/2023, 2:13 PM

## 2023-08-05 NOTE — Consult Note (Signed)
 Reason for Consult: Cervical stenosis/myelopathy Referring Physician: Dr. Lindajean Res Katanya [name].  HPI: The patient is a 73 year old insulin-dependent diabetic hypertensive black female who is not a great medical historian.  The patient, and her sister who is at the bedside, tells me that she been having neck pain into her shoulders and arms for at least several weeks.  She does not relate any specific precipitating event.  Evidently she has been seen at an urgent care and possibly in the ER treated with medications.  She came to the [facility], ER and was worked up with a brain and cervical MRI and a CT angiogram.  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  I was contacted and recommend the patient follow-up in the office.  The patient was admitted for further stroke workup.  She has been seen by Dr. Murvin Arthurs, neurology.  Presently the patient complains of neck pain into her bilateral shoulders and arms.  She feels her hands are weak and her gait has been unsteady.  Past Medical History:  Diagnosis Date   Arthritis    Diabetes mellitus without complication (HCC)    High cholesterol    Hypertension     Past Surgical History:  Procedure Laterality Date   ABDOMINAL AORTOGRAM W/LOWER EXTREMITY N/A 02/03/2022   Procedure: ABDOMINAL AORTOGRAM W/LOWER EXTREMITY;  Surgeon: Young Hensen, MD;  Location: [facility];  Service: Cardiovascular;  Laterality: N/A;   ABDOMINAL HYSTERECTOMY     BONE BIOPSY Right 03/10/2022   Procedure: BONE BIOPSY;  Surgeon: Dot Gazella, [name];  Location: [facility] ORS;  Service: Podiatry;  Laterality: Right;   WOUND DEBRIDEMENT Right 03/10/2022   Procedure: DEBRIDEMENT WOUND/ULCER;  Surgeon: Dot Gazella, [name];  Location: [facility] OR[facility];  Service: Podiatry;  Laterality: Right;    History reviewed. No pertinent family history.  Social History:  reports that she has never  smoked. She has never been exposed to tobacco smoke. She has never used smokeless tobacco. She reports that she does not currently use alcohol. She reports that she does not use drugs.  Allergies:  Allergies  Allergen Reactions   Codeine     Other Reaction(s): Dizziness (intolerance)   Dapagliflozin Other (See Comments)    2017: yeast infection   Latex Itching    Medications: I have reviewed the patient's current medications. Prior to Admission:  Medications Prior to Admission  Medication Sig Dispense Refill Last Dose/Taking   acetaminophen  (TYLENOL ) 500 MG tablet Take 500-1,000 mg by mouth every 6 (six) hours as needed for moderate pain.   08/03/2023   amLODipine (NORVASC) 5 MG tablet Take 5 mg by mouth daily.   08/03/2023   aspirin  EC 81 MG tablet Take 81 mg by mouth daily. Swallow whole.   08/03/2023   insulin glargine, 1 Unit Dial, (TOUJEO SOLOSTAR) 300 UNIT/ML Solostar Pen Inject 80 Units into the skin at bedtime.   08/03/2023   Multiple Vitamin (MULTIVITAMIN WITH MINERALS) TABS tablet Take 1 tablet by mouth daily.   08/03/2023   OZEMPIC, 2 MG/DOSE, 8 MG/3ML SOPN Inject 2 mg into the skin every Wednesday.   07/26/2023   rosuvastatin (CRESTOR) 20 MG tablet Take 20 mg by mouth daily.   08/03/2023   valsartan-hydrochlorothiazide (DIOVAN-HCT) 320-25 MG tablet Take 1 tablet by mouth daily.   08/03/2023   zinc gluconate 50 MG tablet Take 50 mg by mouth daily.   08/03/2023   Alcohol Swabs (  B-D SINGLE USE SWABS REGULAR) PADS       Scheduled:  [START ON 08/06/2023]  stroke: early stages of recovery book   Does not apply Once   amLODipine  5 mg Oral Daily   aspirin  EC  81 mg Oral Daily   insulin aspart  0-5 Units Subcutaneous QHS   insulin aspart  0-9 Units Subcutaneous TID WC   insulin glargine-yfgn  80 Units Subcutaneous QHS   lidocaine   1 patch Transdermal Q24H   rosuvastatin  20 mg Oral Daily   Continuous: PRN:acetaminophen  **OR** acetaminophen  (TYLENOL ) oral liquid 160 mg/5 mL **OR**  acetaminophen , morphine  injection, naLOXone (NARCAN)  injection, oxyCODONE  Anti-infectives (From admission, onward)    None        Results for orders placed or performed during the hospital encounter of 08/04/23 (from the past 48 hours)  I-stat chem 8, ED (not at [facility], [facility] or [facility])     Status: Abnormal   Collection Time: 08/04/23  4:27 PM  Result Value Ref Range   Sodium 144 135 - 145 mmol/L   Potassium 3.8 3.5 - 5.1 mmol/L   Chloride 103 98 - 111 mmol/L   BUN 12 8 - 23 mg/dL   Creatinine, Ser 9.62 (H) 0.44 - 1.00 mg/dL   Glucose, Bld 952 (H) 70 - 99 mg/dL    Comment: Glucose reference range applies only to samples taken after fasting for at least 8 hours.   Calcium, Ion 1.21 1.15 - 1.40 mmol/L   TCO2 29 22 - 32 mmol/L   Hemoglobin 14.3 12.0 - 15.0 g/dL   HCT 84.1 32.4 - 40.1 %  CBC with Differential     Status: Abnormal   Collection Time: 08/04/23  4:29 PM  Result Value Ref Range   WBC 5.9 4.0 - 10.5 K/uL   RBC 5.14 (H) 3.87 - 5.11 MIL/uL   Hemoglobin 14.4 12.0 - 15.0 g/dL   HCT 02.7 25.3 - 66.4 %   MCV 82.3 80.0 - 100.0 fL   MCH 28.0 26.0 - 34.0 pg   MCHC 34.0 30.0 - 36.0 g/dL   RDW 40.3 47.4 - 25.9 %   Platelets 187 150 - 400 K/uL   nRBC 0.0 0.0 - 0.2 %   Neutrophils Relative % 64 %   Neutro Abs 3.8 1.7 - 7.7 K/uL   Lymphocytes Relative 26 %   Lymphs Abs 1.6 0.7 - 4.0 K/uL   Monocytes Relative 7 %   Monocytes Absolute 0.4 0.1 - 1.0 K/uL   Eosinophils Relative 2 %   Eosinophils Absolute 0.1 0.0 - 0.5 K/uL   Basophils Relative 1 %   Basophils Absolute 0.0 0.0 - 0.1 K/uL   Immature Granulocytes 0 %   Abs Immature Granulocytes 0.02 0.00 - 0.07 K/uL    Comment: Performed at [facility], [address], [place], [place] [address]  Comprehensive metabolic panel     Status: Abnormal   Collection Time: 08/04/23  4:29 PM  Result Value Ref Range   Sodium 145 135 - 145 mmol/L   Potassium 3.9 3.5 - 5.1 mmol/L   Chloride 104 98 - 111 mmol/L   CO2 28 22 - 32 mmol/L    Glucose, Bld 109 (H) 70 - 99 mg/dL    Comment: Glucose reference range applies only to samples taken after fasting for at least 8 hours.   BUN 9 8 - 23 mg/dL   Creatinine, Ser 5.64 (H) 0.44 - 1.00 mg/dL   Calcium 9.9 8.9 -  10.3 mg/dL   Total Protein 7.1 6.5 - 8.1 g/dL   Albumin 4.2 3.5 - 5.0 g/dL   AST 34 15 - 41 U/L   ALT 37 0 - 44 U/L   Alkaline Phosphatase 54 38 - 126 U/L   Total Bilirubin 0.5 0.0 - 1.2 mg/dL   GFR, Estimated 57 (L) >60 mL/min    Comment: (NOTE) Calculated using the CKD-EPI Creatinine Equation (2021)    Anion gap 13 5 - 15    Comment: Performed at [facility], [address], [place], [place] [address]  MRSA Next Gen by PCR, Nasal     Status: None   Collection Time: 08/05/23  8:20 AM   Specimen: Nasal Mucosa; Nasal Swab  Result Value Ref Range   MRSA by PCR Next Gen NOT DETECTED NOT DETECTED    Comment: (NOTE) The GeneXpert MRSA Assay (FDA approved for NASAL specimens only), is one component of a comprehensive MRSA colonization surveillance program. It is not intended to diagnose MRSA infection nor to guide or monitor treatment for MRSA infections. Test performance is not FDA approved in patients less than 48 years old. Performed at [facility], [address] [place], [place] [address]   Glucose, capillary     Status: Abnormal   Collection Time: 08/05/23  8:22 AM  Result Value Ref Range   Glucose-Capillary 198 (H) 70 - 99 mg/dL    Comment: Glucose reference range applies only to samples taken after fasting for at least 8 hours.    MR BRAIN WO CONTRAST Result Date: 08/04/2023 CLINICAL DATA:  Acute neurologic deficit EXAM: MRI HEAD WITHOUT CONTRAST MRI CERVICAL SPINE WITHOUT CONTRAST TECHNIQUE: Multiplanar, multiecho pulse sequences of the brain and surrounding structures, and cervical spine, to include the craniocervical junction and cervicothoracic junction, were obtained without intravenous contrast. COMPARISON:  CTA head neck 08/04/2023  FINDINGS: MRI HEAD FINDINGS Brain: There is hyperintensity on diffusion-weighted imaging within the right centrum semiovale. No associated ADC correlate. Chronic microhemorrhage in the medial left temporal lobe. There is multifocal hyperintense T2-weighted signal within the white matter. Parenchymal volume and CSF spaces are normal. The midline structures are normal. Vascular: Normal flow voids. Skull and upper cervical spine: Normal calvarium and skull base. Visualized upper cervical spine and soft tissues are normal. Sinuses/Orbits:No paranasal sinus fluid levels or advanced mucosal thickening. No mastoid or middle ear effusion. Normal orbits. MRI CERVICAL SPINE FINDINGS Alignment: Grade 1 retrolisthesis at C5-6 Vertebrae: No acute abnormality. There is flowing anterior osteophytosis at C4-6. Cord: Normal signal and morphology. Posterior Fossa, vertebral arteries, paraspinal tissues: Negative. Disc levels: C1-2: Unremarkable. 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 Severe spinal canal stenosis with compression of the spinal cord. No neural foraminal stenosis. C3-4: Ossification of the posterior longitudinal ligament. Severe spinal canal stenosis. There is compression of the spinal cord. Severe bilateral neural foraminal stenosis. C4-5: Ossification of posterior longitudinal ligament. Severe spinal canal stenosis. There is compression of the spinal cord. Severe bilateral neural foraminal stenosis. C5-6: Ossification of the posterior longitudinal ligament. Severe spinal canal stenosis. Spinal cord compression. Severe bilateral neural foraminal stenosis. C6-7: Small disc bulge. Moderate spinal canal stenosis. Severe left neural foraminal stenosis. C7-T1: Intermediate sized central disc protrusion. There is no spinal canal stenosis. No neural foraminal stenosis. IMPRESSION: 1. 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 No acute intracranial abnormality. 2. Severe spinal canal stenosis with  compression of the spinal cord at C2-3, C3-4, C4-5 and C5-6. 3. Severe bilateral neural foraminal stenosis  at C3-4, C4-5 and C5-6. 4. Severe left C6-7 neural foraminal stenosis. Electronically Signed   By: Juanetta Nordmann M.D.   On: 08/04/2023 21:48   MR Cervical Spine Wo Contrast Result Date: 08/04/2023 CLINICAL DATA:  Acute neurologic deficit EXAM: MRI HEAD WITHOUT CONTRAST MRI CERVICAL SPINE WITHOUT CONTRAST TECHNIQUE: Multiplanar, multiecho pulse sequences of the brain and surrounding structures, and cervical spine, to include the craniocervical junction and cervicothoracic junction, were obtained without intravenous contrast. COMPARISON:  CTA head neck 08/04/2023 FINDINGS: MRI HEAD FINDINGS Brain: There is hyperintensity on diffusion-weighted imaging within the right centrum semiovale. No associated ADC correlate. Chronic microhemorrhage in the medial left temporal lobe. There is multifocal hyperintense T2-weighted signal within the white matter. Parenchymal volume and CSF spaces are normal. The midline structures are normal. Vascular: Normal flow voids. Skull and upper cervical spine: Normal calvarium and skull base. Visualized upper cervical spine and soft tissues are normal. Sinuses/Orbits:No paranasal sinus fluid levels or advanced mucosal thickening. No mastoid or middle ear effusion. Normal orbits. MRI CERVICAL SPINE FINDINGS Alignment: Grade 1 retrolisthesis at C5-6 Vertebrae: No acute abnormality. There is flowing anterior osteophytosis at C4-6. Cord: Normal signal and morphology. Posterior Fossa, vertebral arteries, paraspinal tissues: Negative. Disc levels: C1-2: Unremarkable. 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 Severe spinal canal stenosis with compression of the spinal cord. No neural foraminal stenosis. C3-4: Ossification of the posterior longitudinal ligament. Severe spinal canal stenosis. There is compression of the spinal cord. Severe bilateral neural foraminal stenosis.  C4-5: Ossification of posterior longitudinal ligament. Severe spinal canal stenosis. There is compression of the spinal cord. Severe bilateral neural foraminal stenosis. C5-6: Ossification of the posterior longitudinal ligament. Severe spinal canal stenosis. Spinal cord compression. Severe bilateral neural foraminal stenosis. C6-7: Small disc bulge. Moderate spinal canal stenosis. Severe left neural foraminal stenosis. C7-T1: Intermediate sized central disc protrusion. There is no spinal canal stenosis. No neural foraminal stenosis. IMPRESSION: 1. 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 No acute intracranial abnormality. 2. Severe spinal canal stenosis with compression of the spinal cord at C2-3, C3-4, C4-5 and C5-6. 3. Severe bilateral neural foraminal stenosis at C3-4, C4-5 and C5-6. 4. Severe left C6-7 neural foraminal stenosis. Electronically Signed   By: Juanetta Nordmann M.D.   On: 08/04/2023 21:48   CT ANGIO HEAD NECK W WO CM Result Date: 08/04/2023 CLINICAL DATA:  Right-sided neck pain, pain radiating to back causing difficulty sleeping, balance difficulty. EXAM: CT ANGIOGRAPHY HEAD AND NECK WITH AND WITHOUT CONTRAST TECHNIQUE: Multidetector CT imaging of the head and neck was performed using the standard protocol during bolus administration of intravenous contrast. Multiplanar CT image reconstructions and MIPs were obtained to evaluate the vascular anatomy. Carotid stenosis measurements (when applicable) are obtained utilizing NASCET criteria, using the distal internal carotid diameter as the denominator. RADIATION DOSE REDUCTION: This exam was performed according to the departmental dose-optimization program which includes automated exposure control, adjustment of the mA and/or kV according to patient size and/or use of iterative reconstruction technique. CONTRAST:  75mL OMNIPAQUE  IOHEXOL  350 MG/ML SOLN COMPARISON:  None Available. FINDINGS: CT HEAD FINDINGS Brain: No acute intracranial  hemorrhage. No CT evidence of acute infarct. No edema, mass effect, or midline shift. The basilar cisterns are patent. Ventricles: Ventricles are normal in size and configuration. Vascular: No hyperdense vessel. Intracranial atherosclerotic calcifications noted. Skull: No acute or aggressive finding. Sinuses/orbits: Left lens replacement. Orbits otherwise unremarkable. Mucous retention cyst in the partially visualized left maxillary sinus. Other: Mastoid air cells are clear. CTA NECK  FINDINGS Aortic arch: Common origin of the brachiocephalic and left common carotid arteries. Imaged portion shows no evidence of aneurysm or dissection. No significant stenosis of the major arch vessel origins. Pulmonary arteries: As permitted by contrast timing, there are no filling defects in the visualized pulmonary arteries. Subclavian arteries: Patent bilaterally. Mild atherosclerosis at the origin of the brachiocephalic artery and along the proximal right subclavian without significant stenosis. Additional mild atherosclerosis at the origin of the left subclavian artery without significant stenosis. Right carotid system: No evidence of dissection, stenosis (50% or greater), or occlusion. Minimal calcified atherosclerosis at the carotid bifurcation. Left carotid system: No evidence of dissection, stenosis (50% or greater), or occlusion. Minimal calcified atherosclerosis at the carotid bifurcation. Vertebral arteries: Codominant. Patent from the origins to the vertebrobasilar confluence. No evidence of occlusion or dissection. Atherosclerosis involving the left vertebral artery at the V3/V4 junction extending into the proximal V4 segment resulting in mild stenosis. Skeleton: No acute findings. Degenerative changes in the cervical spine. 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 There is significant spinal canal stenosis appreciated at C2-3, C3-4, C4-5, and C5-6.  There is additional ossification of the posterior longitudinal ligament at C7-T1 with at least mild spinal canal stenosis. Facet arthrosis at multiple levels in the cervical spine there is significant foraminal narrowing at multiple levels particularly at C3-4 and C4-5. Other neck: The visualized airway is patent. No cervical lymphadenopathy. Large right thyroid nodule measuring at least 3.3 cm in diameter. Upper chest: Visualized lung apices are clear. Review of the MIP images confirms the above findings CTA HEAD FINDINGS ANTERIOR CIRCULATION: The intracranial ICAs are patent bilaterally. Mild atherosclerosis of the bilateral carotid siphons. Mild stenosis of the bilateral cavernous ICAs. Additional moderate stenosis of the left supraclinoid ICA. No proximal occlusion, aneurysm, or vascular malformation. MCAs: The middle cerebral arteries are patent bilaterally. ACAs: The anterior cerebral arteries are patent bilaterally. POSTERIOR CIRCULATION: No significant stenosis, proximal occlusion, aneurysm, or vascular malformation. PCAs: The posterior cerebral arteries are patent bilaterally. Pcomm: Not well visualized SCAs: The superior cerebellar arteries are patent bilaterally. Basilar artery: Patent AICAs: Patent PICAs: Patent Vertebral arteries: As above. Venous sinuses: As permitted by contrast timing, patent. Anatomic variants: None Review of the MIP images confirms the above findings IMPRESSION: No large vessel occlusion. No CT evidence of acute intracranial abnormality. No significant atherosclerosis of the arteries in the neck. 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 Atherosclerosis involving the left vertebral artery at the V3/V4 junction and proximal V4 segment resulting in mild stenosis. Significant degenerative changes of the cervical spine along with ossification of the posterior longitudinal ligament.  Significant osseous spinal canal narrowing at multiple levels. Additional significant osseous foraminal narrowing. Recommend correlation with MRI cervical spine. Right thyroid nodule measuring up to 3.3 cm. Recommend nonemergent correlation with thyroid ultrasound if not previously performed. Electronically Signed   By: Denny Flack M.D.   On: 08/04/2023 17:58    ROS: As above Blood pressure 134/73, pulse 73, temperature 98.4 F (36.9 C), temperature source Oral, resp. rate 18, weight 99.3 kg, SpO2 97%. Estimated body mass index is 34.3 kg/m as calculated from the following:   Height as of 05/24/23: 5\' 7"  (1.702 m).   Weight as of this encounter: 99.3 kg.  Physical Exam  General: An alert 73 year old black female in no apparent distress who is at times mildly  confused.  HEENT: Normocephalic, atraumatic, extraocular muscles are intact  Neck: Decreased cervical range of motion.  Thorax: Symmetric  Abdomen: Soft  Extremities: Unremarkable  Neurologic exam: The patient is alert and oriented.  At times she gets a bit confused.  It is difficult to accurately assess her strength as she gives away the pain in her upper extremities.  Her motor strength in grossly normal except she has right greater left hand grip strength.  Light touch sensation is intact in all tested dermatomes bilaterally.  She has some difficulty with rapid alternating movements of the upper extremities.  She is a bit hyperreflexic in her bilateral quadriceps.  There is no ankle clonus.  Cranial nerves II through XII are examined bilaterally and grossly normal.  Imaging studies: Her brain MRI demonstrates a subacute infarct in the right centrum semiovale  I reviewed the patient's cervical CT and MRI.  She has a mildly kyphotic cervical spine.  She has a degenerative spondylolisthesis at C6-7.  She has ossification of the posterior longitudinal ligament with severe stenosis from C2-3 to C6-7.  She has moderate ventral  spondylosis at C7-T1.  Assessment/Plan: Ossification of the posterior longitudinal ligament, cervical spondylolisthesis, cervical spondylosis, cervical stenosis, cervical myelopathy, cervicalgia, cervical radiculopathy: I have discussed the situation with the patient and her sister.  I have described her cervical MRI to them.  We discussed the treatment options on doing nothing versus surgery.  I have explained to her that this is a chronic condition that has come on over many years which will likely worsen as she gets older and possibly lead to worsening spinal cord dysfunction and even paralysis.  I therefore would recommend surgery.  We briefly discussed a posterior cervical decompression, instrumentation and fusion.  This does not need to be done urgently.  I recommend she follow-up in the office where we can review her MRI scan, surgical models and further discuss surgery and the risk.  She is agreeable.  Please have her follow-up with me in the office for further discussions.  I have answered all the patient's, and her sisters, questions.  Elder Greening 08/05/2023, 10:24 AM

## 2023-08-05 NOTE — Consult Note (Addendum)
 NEUROLOGY CONSULT NOTE   Date of service: August 05, 2023 Patient Name: Carla Stokes MRN:  [id number] DOB:  1950-11-25 Chief Complaint: incidental stroke on MRI [facility] Requesting Provider: Nolberto Batty, DO  History of Present Illness  Carla Stokes is a 73 y.o. female with hx of DM2, HTN, obesity, degenerative disc disease who presents with neck pain going into her back and feeling somewhat off balance. She saw her PCP and was sent to the ED.  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  No prior hx of stroke, mother had stroke, endorses hx of DM2, HTN, Hld but well controlled. Does not smoke, no EtOh, does not use marijuana.  LKW: unclear, stroke is incidental. Modified rankin score: 0-Completely asymptomatic and back to baseline post- stroke IV Thrombolysis: not offered, NIHSS of 0 EVT: not offered, no LVO, nihss of 0  NIHSS components Score: Comment  1a Level of Conscious 0[x]  1[]  2[]  3[]      1b LOC Questions 0[x]  1[]  2[]       1c LOC Commands 0[x]  1[]  2[]       2 Best Gaze 0[x]  1[]  2[]       3 Visual 0[x]  1[]  2[]  3[]      4 Facial Palsy 0[x]  1[]  2[]  3[]      5a Motor Arm - left 0[x]  1[]  2[]  3[]  4[]  UN[]    5b Motor Arm - Right 0[x]  1[]  2[]  3[]  4[]  UN[]    6a Motor Leg - Left 0[x]  1[]  2[]  3[]  4[]  UN[]    6b Motor Leg - Right 0[x]  1[]  2[]  3[]  4[]  UN[]    7 Limb Ataxia 0[x]  1[]  2[]  UN[]      8 Sensory 0[x]  1[]  2[]  UN[]      9 Best Language 0[x]  1[]  2[]  3[]      10 Dysarthria 0[x]  1[]  2[]  UN[]      11 Extinct. and Inattention 0[x]  1[]  2[]       TOTAL: 0      ROS  Comprehensive ROS performed and pertinent positives documented in HPI   Past History   Past Medical History:  Diagnosis Date   Arthritis    Diabetes mellitus without complication (HCC)    High cholesterol    Hypertension     Past Surgical History:  Procedure Laterality Date   ABDOMINAL AORTOGRAM W/LOWER EXTREMITY N/A  02/03/2022   Procedure: ABDOMINAL AORTOGRAM W/LOWER EXTREMITY;  Surgeon: Young Hensen, MD;  Location: [facility];  Service: Cardiovascular;  Laterality: N/A;   ABDOMINAL HYSTERECTOMY     BONE BIOPSY Right 03/10/2022   Procedure: BONE BIOPSY;  Surgeon: Dot Gazella, [name];  Location: [facility];  Service: Podiatry;  Laterality: Right;   WOUND DEBRIDEMENT Right 03/10/2022   Procedure: DEBRIDEMENT WOUND/ULCER;  Surgeon: Dot Gazella, [name];  Location: [facility];  Service: Podiatry;  Laterality: Right;    Family History: History reviewed. No pertinent family history.  Social History  reports that she has never smoked. She has never been exposed to tobacco smoke. She has never used smokeless tobacco. She reports that she does not currently use alcohol. She reports that she does not use drugs.  Allergies  Allergen Reactions   Codeine     Other Reaction(s): Dizziness (intolerance)   Dapagliflozin Other (See Comments)    2017: yeast infection   Latex Itching    Medications   Current Facility-Administered Medications:    LORazepam  (ATIVAN ) injection 1 mg, 1 mg, Intravenous,  Once, Prosperi, Christian H, PA-C  Current Outpatient Medications:    acetaminophen  (TYLENOL ) 500 MG tablet, Take 500-1,000 mg by mouth every 6 (six) hours as needed for moderate pain., Disp: , Rfl:    amLODipine  (NORVASC ) 5 MG tablet, Take 5 mg by mouth daily., Disp: , Rfl:    aspirin  EC 81 MG tablet, Take 81 mg by mouth daily. Swallow whole., Disp: , Rfl:    insulin  glargine, 1 Unit Dial , (TOUJEO  SOLOSTAR) 300 UNIT/ML Solostar Pen, Inject 80 Units into the skin at bedtime., Disp: , Rfl:    Multiple Vitamin (MULTIVITAMIN WITH MINERALS) TABS tablet, Take 1 tablet by mouth daily., Disp: , Rfl:    OZEMPIC, 2 MG/DOSE, 8 MG/3ML SOPN, Inject 2 mg into the skin every Wednesday., Disp: , Rfl:    rosuvastatin  (CRESTOR ) 20 MG tablet, Take 20 mg by mouth daily., Disp: , Rfl:    valsartan -hydrochlorothiazide  (DIOVAN -HCT)  320-25 MG tablet, Take 1 tablet by mouth daily., Disp: , Rfl:    zinc gluconate 50 MG tablet, Take 50 mg by mouth daily., Disp: , Rfl:    Alcohol Swabs (B-D SINGLE USE SWABS REGULAR) PADS, , Disp: , Rfl:   Vitals   Vitals:   08/04/23 1701 08/04/23 2225 08/04/23 2358 08/05/23 0207  BP: (!) 146/92 (!) 165/83 (!) 159/71 (!) 140/66  Pulse: 64 69 (!) 58 (!) 54  Resp: 16 18 18 17   Temp: (!) 97.4 F (36.3 C) 97.9 F (36.6 C)  98 F (36.7 C)  TempSrc:  Oral  Oral  SpO2: 99% 100% 100% 100%  Weight:        Body mass index is 34.3 kg/m.   Physical Exam   General: Laying comfortably in bed; in no acute distress.  HENT: Normal oropharynx and mucosa. Normal external appearance of ears and nose.  Neck: Supple or tenderness  CV: No JVD. No peripheral edema.  Pulmonary: Symmetric Chest rise. Normal respiratory effort.  Abdomen: Soft to touch, non-tender.  Ext: No cyanosis, edema, or deformity  Skin: No rash. Normal palpation of skin.   Musculoskeletal: Normal digits and nails by inspection. No clubbing.   Neurologic Examination  Mental status/Cognition: Alert, oriented to self, place, month and year, good attention.  Speech/language: Fluent, comprehension intact, object naming intact, repetition intact.  Cranial nerves:   CN II Pupils equal and reactive to light, no VF deficits    CN III,IV,VI EOM intact, no gaze preference or deviation, no nystagmus    CN V normal sensation in V1, V2, and V3 segments bilaterally    CN VII no asymmetry, no nasolabial fold flattening    CN VIII normal hearing to speech    CN IX & X normal palatal elevation, no uvular deviation    CN XI 5/5 head turn and 5/5 shoulder shrug bilaterally    CN XII midline tongue protrusion    Motor:  Muscle bulk: normal, tone normal, pronator drift none tremor none Mvmt Root Nerve  Muscle Right Left Comments  SA C5/6 Ax Deltoid 5 5   EF C5/6 Mc Biceps 5 5   EE C6/7/8 Rad Triceps 5 5   WF C6/7 Med FCR     WE C7/8 PIN  ECU     F Ab C8/T1 U ADM/FDI 5 5   HF L1/2/3 Fem Illopsoas 5 5   KE L2/3/4 Fem Quad 5 5   DF L4/5 D Peron Tib Ant 5 5   PF S1/2 Tibial Grc/Sol 5 5    Reflexes:  Right Left Comments  Pectoralis      Biceps (C5/6) 2 2   Brachioradialis (C5/6) 2 2    Triceps (C6/7) 2 2    Patellar (L3/4) 2 2    Achilles (S1)      Hoffman      Plantar     Jaw jerk    Sensation:  Light touch Intact throughout   Pin prick    Temperature    Vibration   Proprioception    Coordination/Complex Motor:  - Finger to Nose intact BL - Heel to shin intact BL - Rapid alternating movement are normal - Gait: deferred.   Labs/Imaging/Neurodiagnostic studies   CBC:  Recent Labs  Lab 08-12-2023 1627 2023/08/12 1629  WBC  --  5.9  NEUTROABS  --  3.8  HGB 14.3 14.4  HCT 42.0 42.3  MCV  --  82.3  PLT  --  187   Basic Metabolic Panel:  Lab Results  Component Value Date   NA 145 08-12-2023   K 3.9 Aug 12, 2023   CO2 28 08/12/2023   GLUCOSE 109 (H) Aug 12, 2023   BUN 9 2023-08-12   CREATININE 1.05 (H) 08/12/23   CALCIUM  9.9 08/12/2023   GFRNONAA 57 (L) 08/12/2023   Lipid Panel: No results found for: LDLCALC HgbA1c: No results found for: HGBA1C Urine Drug Screen: No results found for: LABOPIA, COCAINSCRNUR, LABBENZ, AMPHETMU, THCU, LABBARB  Alcohol Level No results found for: [facility] INR  Lab Results  Component Value Date   INR 1.0 10/20/2021   APTT No results found for: APTT AED levels: No results found for: PHENYTOIN, ZONISAMIDE, LAMOTRIGINE, LEVETIRACETA  CT Head without contrast(Personally reviewed): CTH was negative for a large hypodensity concerning for a large territory infarct or hyperdensity concerning for an ICH  CT angio Head and Neck with contrast(Personally reviewed): No LVO  MRI Brain(Personally reviewed): 1. 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 No acute intracranial abnormality. 2. Severe spinal canal stenosis with compression of the  spinal cord at C2-3, C3-4, C4-5 and C5-6. 3. Severe bilateral neural foraminal stenosis at C3-4, C4-5 and C5-6. 4. Severe left C6-7 neural foraminal stenosis.  MR C spine without contrast(Personally reviewed): 1. 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 No acute intracranial abnormality. 2. Severe spinal canal stenosis with compression of the spinal cord at C2-3, C3-4, C4-5 and C5-6. 3. Severe bilateral neural foraminal stenosis at C3-4, C4-5 and C5-6. 4. Severe left C6-7 neural foraminal stenosis.  ASSESSMENT   Carla Stokes is a 73 y.o. female with hx of DM2, HTN, obesity, degenerative disc disease who presents with neck pain going into her back and feeling somewhat off balance. She saw her PCP and was sent to the ED. Mri Brain with a small R centrum semiovale stroke, likely incidental. Suspect neck pain/back pain and feeling off balance is likely explained by spinal canal stenosis and multilevel cervical cord compression.  RECOMMENDATIONS  - Frequent Neuro checks per stroke unit protocol - Recommend obtaining TTE  - Recommend obtaining Lipid panel with LDL - Please start statin if LDL > 70 - Recommend HbA1c to evaluate for diabetes and how well it is controlled. - Antithrombotic - aspirin  81mg  daily. - Recommend DVT ppx - SBP goal - aim for gradual normotension. - Recommend Telemetry monitoring for arrythmia - Recommend bedside swallow screen prior to PO intake. - Stroke education booklet - Recommend PT/OT/SLP consult - defer management of noted multilevel Cervical spinal cord compression to neurosurgery team. Ed team has discussed with them.  ______________________________________________________________________  I spent of inpatient face-to-face time with the patient, reviewing test results, images and medication, and discussing the diagnosis, treatment plan and potential prognosis. This patient's care requires review of multiple databases,  neurological assessment, discussion with family, other specialists and medical decision making of high complexity. I had long discussion with patient and updated pt current condition, treatment plan and potential prognosis, and answered all the questions.  Patient expressed understanding and appreciation.     Signed, Jaiana Sheffer, MD [facility]

## 2023-08-05 NOTE — Evaluation (Signed)
 Speech Language Pathology Evaluation Patient Details Name: Carla Stokes MRN: [id number] DOB: 03/19/1950 Today's Date: 08/05/2023 Time: 7829-5621 SLP Time Calculation (min) (ACUTE ONLY): 15 min  Problem List:  Patient Active Problem List   Diagnosis Date Noted   CVA (cerebral vascular accident) (HCC) 08/05/2023   Cervical stenosis of spinal canal 08/05/2023   Thyroid nodule 08/05/2023   Essential hypertension 08/05/2023   Hyperlipidemia 08/05/2023   Type 2 diabetes mellitus (HCC) 08/05/2023   Bleeding internal hemorrhoids 04/13/2022   Subacute osteomyelitis of right foot (HCC) 03/16/2022   Diabetic foot ulcer (HCC) 01/18/2022   PAD (peripheral artery disease) (HCC) 01/18/2022   Past Medical History:  Past Medical History:  Diagnosis Date   Arthritis    Diabetes mellitus without complication (HCC)    High cholesterol    Hypertension    Past Surgical History:  Past Surgical History:  Procedure Laterality Date   ABDOMINAL AORTOGRAM W/LOWER EXTREMITY N/A 02/03/2022   Procedure: ABDOMINAL AORTOGRAM W/LOWER EXTREMITY;  Surgeon: Carla Hensen, MD;  Location: [facility];  Service: Cardiovascular;  Laterality: N/A;   ABDOMINAL HYSTERECTOMY     BONE BIOPSY Right 03/10/2022   Procedure: BONE BIOPSY;  Surgeon: Dot Gazella, [name];  Location: [facility];  Service: Podiatry;  Laterality: Right;   WOUND DEBRIDEMENT Right 03/10/2022   Procedure: DEBRIDEMENT WOUND/ULCER;  Surgeon: Dot Gazella, [name];  Location: [facility];  Service: Podiatry;  Laterality: Right;   HPI:  Pt is a 73 yo female presenting to [facility] on 08/04/23 with c/o neck pain and impaired balance. 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 PMH of HTN, DM II, obesity, DDD   Assessment / Plan / Recommendation Clinical Impression  Pt reports some acute changes to thinking skills since admission, describing it as "clouded". Pts sister present and states pt has exhibited some memory troubles. Pt reports  that she lives alone and has been able to drive still and take care of IADLs including medicines and finances. Pt reports feeling fatigued; portions of SLUMS administered with deficits noted in mental manipulation, thought organization, executive function, and delayed recall. Expressive and receptive language as well as motor speech skills appeared intact. Will continue to follow for cognitive deficits noted above to ensure safety and independence with ADLs.    SLP Assessment  SLP Recommendation/Assessment: Patient needs continued Speech Language Pathology Services SLP Visit Diagnosis: Cognitive communication deficit (R41.841)     Assistance Recommended at Discharge     Functional Status Assessment Patient has had a recent decline in their functional status and demonstrates the ability to make significant improvements in function in a reasonable and predictable amount of time.  Frequency and Duration min 1 x/week  1 week      SLP Evaluation Cognition  Overall Cognitive Status: Impaired/Different from baseline (reports some increased confusion since admission) Arousal/Alertness: Awake/alert Orientation Level: Oriented X4 Year: 2025 Attention: Focused;Sustained Focused Attention: Impaired Focused Attention Impairment: Verbal complex;Functional complex Memory: Impaired Memory Impairment: Decreased recall of new information;Decreased short term memory Awareness: Appears intact Problem Solving: Impaired Problem Solving Impairment: Verbal complex;Functional complex Executive Function: Organizing;Sequencing Sequencing: Impaired Organizing: Impaired Safety/Judgment: Environmental consultant Comprehension Overall Auditory Comprehension: Appears within functional limits for tasks assessed    Expression Expression Primary Mode of Expression: Verbal Verbal Expression Overall Verbal Expression: Appears within functional limits for tasks assessed Written  Expression Dominant Hand: Right   Oral / Motor  Oral Motor/Sensory Function Overall Oral  Motor/Sensory Function: Within functional limits Motor Speech Overall Motor Speech: Appears within functional limits for tasks assessed           Tanner Fanny MA, CCC-SLP Acute Rehabilitation Services   08/05/2023, 12:57 PM

## 2023-08-05 NOTE — Progress Notes (Signed)
 STROKE TEAM PROGRESS NOTE   SUBJECTIVE (INTERVAL HISTORY) Her sister is at the bedside.  Overall her condition is stable.  She still has neck pain and radiating to the back.  Still has bilateral lower extremity ataxia on exam.  MRI brain showed subacute incidental right CR small infarct.   OBJECTIVE Temp:  [97.4 F (36.3 C)-98.5 F (36.9 C)] 98.2 F (36.8 C) (06/07 1607) Pulse Rate:  [54-75] 75 (06/07 1607) Cardiac Rhythm: Normal sinus rhythm (06/07 0802) Resp:  [16-20] 20 (06/07 1607) BP: (133-165)/(66-92) 138/71 (06/07 1607) SpO2:  [95 %-100 %] 99 % (06/07 1607)  Recent Labs  Lab 08/05/23 0822 08/05/23 1136  GLUCAP 198* 199*   Recent Labs  Lab 08/04/23 1627 08/04/23 1629  NA 144 145  K 3.8 3.9  CL 103 104  CO2  --  28  GLUCOSE 106* 109*  BUN 12 9  CREATININE 1.10* 1.05*  CALCIUM  --  9.9   Recent Labs  Lab 08/04/23 1629  AST 34  ALT 37  ALKPHOS 54  BILITOT 0.5  PROT 7.1  ALBUMIN 4.2   Recent Labs  Lab 08/04/23 1627 08/04/23 1629  WBC  --  5.9  NEUTROABS  --  3.8  HGB 14.3 14.4  HCT 42.0 42.3  MCV  --  82.3  PLT  --  187   No results for input(s): "CKTOTAL", "CKMB", "CKMBINDEX", "TROPONINI" in the last 168 hours. No results for input(s): "LABPROT", "INR" in the last 72 hours. No results for input(s): "COLORURINE", "LABSPEC", "PHURINE", "GLUCOSEU", "HGBUR", "BILIRUBINUR", "KETONESUR", "PROTEINUR", "UROBILINOGEN", "NITRITE", "LEUKOCYTESUR" in the last 72 hours.  Invalid input(s): "APPERANCEUR"     Component Value Date/Time   CHOL 106 08/05/2023 0945   TRIG 45 08/05/2023 0945   HDL 48 08/05/2023 0945   CHOLHDL 2.2 08/05/2023 0945   VLDL 9 08/05/2023 0945   LDLCALC 49 08/05/2023 0945   No results found for: "HGBA1C"    Component Value Date/Time   LABOPIA POSITIVE (A) 08/05/2023 0708   COCAINSCRNUR NONE DETECTED 08/05/2023 0708   LABBENZ NONE DETECTED 08/05/2023 0708   AMPHETMU NONE DETECTED 08/05/2023 0708   THCU NONE DETECTED 08/05/2023 0708    LABBARB NONE DETECTED 08/05/2023 0708    No results for input(s): "ETH" in the last 168 hours.  I have personally reviewed the radiological images below and agree with the radiology interpretations.  ECHOCARDIOGRAM COMPLETE Result Date: 08/05/2023    ECHOCARDIOGRAM REPORT   Patient Name:   [facility] Date of Exam: 08/05/2023 Medical Rec #:  [id number]            Height:       67.0 in Accession #:    [id number]           Weight:       219.0 lb Date of Birth:  October 16, 1950            BSA:          2.102 m Patient Age:    72 years             BP:           140/66 mmHg Patient Gender: F                    HR:           77 bpm. Exam Location:  Inpatient Procedure: 2D Echo, Color Doppler, Cardiac Doppler and Intracardiac  Opacification Agent (Both Spectral and Color Flow Doppler were            utilized during procedure). Indications:    Stroke  History:        Patient has no prior history of Echocardiogram examinations.                 Risk Factors:Hypertension, Diabetes, Dyslipidemia and Obesity.  Sonographer:    Travis Friedman RDCS Referring Phys: [id number] VASUNDHRA RATHORE IMPRESSIONS  1. Left ventricular ejection fraction, by estimation, is 60 to 65%. The left ventricle has normal function. The left ventricle has no regional wall motion abnormalities. There is moderate left ventricular hypertrophy. Left ventricular diastolic parameters are consistent with Grade I diastolic dysfunction (impaired relaxation).  2. Right ventricular systolic function is normal. The right ventricular size is normal.  3. The mitral valve is abnormal. No evidence of mitral valve regurgitation. No evidence of mitral stenosis.  4. The aortic valve is tricuspid. There is moderate calcification of the aortic valve. There is moderate thickening of the aortic valve. Aortic valve regurgitation is not visualized. Aortic valve sclerosis is present, with no evidence of aortic valve stenosis.  5. The inferior vena cava is normal in  size with greater than 50% respiratory variability, suggesting right atrial pressure of 3 mmHg. FINDINGS  Left Ventricle: Left ventricular ejection fraction, by estimation, is 60 to 65%. The left ventricle has normal function. The left ventricle has no regional wall motion abnormalities. Strain was performed and the global longitudinal strain is indeterminate. The left ventricular internal cavity size was normal in size. There is moderate left ventricular hypertrophy. Left ventricular diastolic parameters are consistent with Grade I diastolic dysfunction (impaired relaxation). Right Ventricle: The right ventricular size is normal. No increase in right ventricular wall thickness. Right ventricular systolic function is normal. Left Atrium: Left atrial size was normal in size. Right Atrium: Right atrial size was normal in size. Pericardium: There is no evidence of pericardial effusion. Mitral Valve: The mitral valve is abnormal. There is mild thickening of the mitral valve leaflet(s). There is mild calcification of the mitral valve leaflet(s). No evidence of mitral valve regurgitation. No evidence of mitral valve stenosis. Tricuspid Valve: The tricuspid valve is normal in structure. Tricuspid valve regurgitation is not demonstrated. No evidence of tricuspid stenosis. Aortic Valve: The aortic valve is tricuspid. There is moderate calcification of the aortic valve. There is moderate thickening of the aortic valve. Aortic valve regurgitation is not visualized. Aortic valve sclerosis is present, with no evidence of aortic valve stenosis. Aortic valve mean gradient measures 4.0 mmHg. Aortic valve peak gradient measures 7.1 mmHg. Aortic valve area, by VTI measures 2.89 cm. Pulmonic Valve: The pulmonic valve was normal in structure. Pulmonic valve regurgitation is trivial. No evidence of pulmonic stenosis. Aorta: The aortic root is normal in size and structure. Venous: The inferior vena cava is normal in size with greater  than 50% respiratory variability, suggesting right atrial pressure of 3 mmHg. IAS/Shunts: No atrial level shunt detected by color flow Doppler. Additional Comments: 3D was performed not requiring image post processing on an independent workstation and was indeterminate.  LEFT VENTRICLE PLAX 2D LVIDd:         3.80 cm   Diastology LVIDs:         2.40 cm   LV e' medial:    6.31 cm/s LV PW:         1.20 cm   LV E/e' medial:  8.6 LV IVS:  1.30 cm   LV e' lateral:   6.96 cm/s LVOT diam:     2.00 cm   LV E/e' lateral: 7.8 LV SV:         79 LV SV Index:   38 LVOT Area:     3.14 cm  RIGHT VENTRICLE RV Basal diam:  2.90 cm RV S prime:     22.40 cm/s TAPSE (M-mode): 2.5 cm LEFT ATRIUM              Index        RIGHT ATRIUM           Index LA diam:        3.40 cm  1.62 cm/m   RA Area:     16.40 cm LA Vol (A2C):   100.0 ml 47.58 ml/m  RA Volume:   44.10 ml  20.98 ml/m LA Vol (A4C):   53.8 ml  25.60 ml/m LA Biplane Vol: 75.8 ml  36.06 ml/m  AORTIC VALVE AV Area (Vmax):    2.65 cm AV Area (Vmean):   2.66 cm AV Area (VTI):     2.89 cm AV Vmax:           133.00 cm/s AV Vmean:          87.300 cm/s AV VTI:            0.275 m AV Peak Grad:      7.1 mmHg AV Mean Grad:      4.0 mmHg LVOT Vmax:         112.00 cm/s LVOT Vmean:        73.900 cm/s LVOT VTI:          0.253 m LVOT/AV VTI ratio: 0.92  AORTA Ao Root diam: 3.10 cm Ao Asc diam:  3.70 cm MITRAL VALVE MV Area (PHT): 3.21 cm    SHUNTS MV Decel Time: 236 msec    Systemic VTI:  0.25 m MV E velocity: 54.50 cm/s  Systemic Diam: 2.00 cm MV A velocity: 74.90 cm/s MV E/A ratio:  0.73 Janelle Mediate MD Electronically signed by Janelle Mediate MD Signature Date/Time: 08/05/2023/3:28:13 PM    Final    MR BRAIN WO CONTRAST Result Date: 08/04/2023 CLINICAL DATA:  Acute neurologic deficit EXAM: MRI HEAD WITHOUT CONTRAST MRI CERVICAL SPINE WITHOUT CONTRAST TECHNIQUE: Multiplanar, multiecho pulse sequences of the brain and surrounding structures, and cervical spine, to include the  craniocervical junction and cervicothoracic junction, were obtained without intravenous contrast. COMPARISON:  CTA head neck 08/04/2023 FINDINGS: MRI HEAD FINDINGS Brain: There is hyperintensity on diffusion-weighted imaging within the right centrum semiovale. No associated ADC correlate. Chronic microhemorrhage in the medial left temporal lobe. There is multifocal hyperintense T2-weighted signal within the white matter. Parenchymal volume and CSF spaces are normal. The midline structures are normal. Vascular: Normal flow voids. Skull and upper cervical spine: Normal calvarium and skull base. Visualized upper cervical spine and soft tissues are normal. Sinuses/Orbits:No paranasal sinus fluid levels or advanced mucosal thickening. No mastoid or middle ear effusion. Normal orbits. MRI CERVICAL SPINE FINDINGS Alignment: Grade 1 retrolisthesis at C5-6 Vertebrae: No acute abnormality. There is flowing anterior osteophytosis at C4-6. Cord: Normal signal and morphology. Posterior Fossa, vertebral arteries, paraspinal tissues: Negative. Disc levels: C1-2: Unremarkable. 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 Severe spinal canal stenosis with compression of the spinal cord. No neural foraminal stenosis. C3-4: Ossification of the posterior longitudinal ligament. Severe spinal canal stenosis. There is compression of the spinal cord. Severe bilateral  neural foraminal stenosis. C4-5: Ossification of posterior longitudinal ligament. Severe spinal canal stenosis. There is compression of the spinal cord. Severe bilateral neural foraminal stenosis. C5-6: Ossification of the posterior longitudinal ligament. Severe spinal canal stenosis. Spinal cord compression. Severe bilateral neural foraminal stenosis. C6-7: Small disc bulge. Moderate spinal canal stenosis. Severe left neural foraminal stenosis. C7-T1: Intermediate sized central disc protrusion. There is no spinal canal stenosis. No neural foraminal stenosis.  IMPRESSION: 1. 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 No acute intracranial abnormality. 2. Severe spinal canal stenosis with compression of the spinal cord at C2-3, C3-4, C4-5 and C5-6. 3. Severe bilateral neural foraminal stenosis at C3-4, C4-5 and C5-6. 4. Severe left C6-7 neural foraminal stenosis. Electronically Signed   By: Juanetta Nordmann M.D.   On: 08/04/2023 21:48   MR Cervical Spine Wo Contrast Result Date: 08/04/2023 CLINICAL DATA:  Acute neurologic deficit EXAM: MRI HEAD WITHOUT CONTRAST MRI CERVICAL SPINE WITHOUT CONTRAST TECHNIQUE: Multiplanar, multiecho pulse sequences of the brain and surrounding structures, and cervical spine, to include the craniocervical junction and cervicothoracic junction, were obtained without intravenous contrast. COMPARISON:  CTA head neck 08/04/2023 FINDINGS: MRI HEAD FINDINGS Brain: There is hyperintensity on diffusion-weighted imaging within the right centrum semiovale. No associated ADC correlate. Chronic microhemorrhage in the medial left temporal lobe. There is multifocal hyperintense T2-weighted signal within the white matter. Parenchymal volume and CSF spaces are normal. The midline structures are normal. Vascular: Normal flow voids. Skull and upper cervical spine: Normal calvarium and skull base. Visualized upper cervical spine and soft tissues are normal. Sinuses/Orbits:No paranasal sinus fluid levels or advanced mucosal thickening. No mastoid or middle ear effusion. Normal orbits. MRI CERVICAL SPINE FINDINGS Alignment: Grade 1 retrolisthesis at C5-6 Vertebrae: No acute abnormality. There is flowing anterior osteophytosis at C4-6. Cord: Normal signal and morphology. Posterior Fossa, vertebral arteries, paraspinal tissues: Negative. Disc levels: C1-2: Unremarkable. 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 Severe spinal canal stenosis with compression of the spinal cord. No neural foraminal stenosis. C3-4: Ossification of the posterior  longitudinal ligament. Severe spinal canal stenosis. There is compression of the spinal cord. Severe bilateral neural foraminal stenosis. C4-5: Ossification of posterior longitudinal ligament. Severe spinal canal stenosis. There is compression of the spinal cord. Severe bilateral neural foraminal stenosis. C5-6: Ossification of the posterior longitudinal ligament. Severe spinal canal stenosis. Spinal cord compression. Severe bilateral neural foraminal stenosis. C6-7: Small disc bulge. Moderate spinal canal stenosis. Severe left neural foraminal stenosis. C7-T1: Intermediate sized central disc protrusion. There is no spinal canal stenosis. No neural foraminal stenosis. IMPRESSION: 1. 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 No acute intracranial abnormality. 2. Severe spinal canal stenosis with compression of the spinal cord at C2-3, C3-4, C4-5 and C5-6. 3. Severe bilateral neural foraminal stenosis at C3-4, C4-5 and C5-6. 4. Severe left C6-7 neural foraminal stenosis. Electronically Signed   By: Juanetta Nordmann M.D.   On: 08/04/2023 21:48   CT ANGIO HEAD NECK W WO CM Result Date: 08/04/2023 CLINICAL DATA:  Right-sided neck pain, pain radiating to back causing difficulty sleeping, balance difficulty. EXAM: CT ANGIOGRAPHY HEAD AND NECK WITH AND WITHOUT CONTRAST TECHNIQUE: Multidetector CT imaging of the head and neck was performed using the standard protocol during bolus administration of intravenous contrast. Multiplanar CT image reconstructions and MIPs were obtained to evaluate the vascular anatomy. Carotid stenosis measurements (when applicable) are obtained utilizing NASCET criteria, using the distal internal carotid diameter as the denominator. RADIATION DOSE REDUCTION: This exam was performed according to the departmental  dose-optimization program which includes automated exposure control, adjustment of the mA and/or kV according to patient size and/or use of iterative reconstruction technique.  CONTRAST:  75mL OMNIPAQUE  IOHEXOL  350 MG/ML SOLN COMPARISON:  None Available. FINDINGS: CT HEAD FINDINGS Brain: No acute intracranial hemorrhage. No CT evidence of acute infarct. No edema, mass effect, or midline shift. The basilar cisterns are patent. Ventricles: Ventricles are normal in size and configuration. Vascular: No hyperdense vessel. Intracranial atherosclerotic calcifications noted. Skull: No acute or aggressive finding. Sinuses/orbits: Left lens replacement. Orbits otherwise unremarkable. Mucous retention cyst in the partially visualized left maxillary sinus. Other: Mastoid air cells are clear. CTA NECK FINDINGS Aortic arch: Common origin of the brachiocephalic and left common carotid arteries. Imaged portion shows no evidence of aneurysm or dissection. No significant stenosis of the major arch vessel origins. Pulmonary arteries: As permitted by contrast timing, there are no filling defects in the visualized pulmonary arteries. Subclavian arteries: Patent bilaterally. Mild atherosclerosis at the origin of the brachiocephalic artery and along the proximal right subclavian without significant stenosis. Additional mild atherosclerosis at the origin of the left subclavian artery without significant stenosis. Right carotid system: No evidence of dissection, stenosis (50% or greater), or occlusion. Minimal calcified atherosclerosis at the carotid bifurcation. Left carotid system: No evidence of dissection, stenosis (50% or greater), or occlusion. Minimal calcified atherosclerosis at the carotid bifurcation. Vertebral arteries: Codominant. Patent from the origins to the vertebrobasilar confluence. No evidence of occlusion or dissection. Atherosclerosis involving the left vertebral artery at the V3/V4 junction extending into the proximal V4 segment resulting in mild stenosis. Skeleton: No acute findings. Degenerative changes in the cervical spine. Prominent disc osteophyte complexes as well as ossification of  the posterior longitudinal ligament from C2 to C5. There is significant spinal canal stenosis appreciated at C2-3, C3-4, C4-5, and C5-6. There is additional ossification of the posterior longitudinal ligament at C7-T1 with at least mild spinal canal stenosis. Facet arthrosis at multiple levels in the cervical spine there is significant foraminal narrowing at multiple levels particularly at C3-4 and C4-5. Other neck: The visualized airway is patent. No cervical lymphadenopathy. Large right thyroid nodule measuring at least 3.3 cm in diameter. Upper chest: Visualized lung apices are clear. Review of the MIP images confirms the above findings CTA HEAD FINDINGS ANTERIOR CIRCULATION: The intracranial ICAs are patent bilaterally. Mild atherosclerosis of the bilateral carotid siphons. Mild stenosis of the bilateral cavernous ICAs. Additional moderate stenosis of the left supraclinoid ICA. No proximal occlusion, aneurysm, or vascular malformation. MCAs: The middle cerebral arteries are patent bilaterally. ACAs: The anterior cerebral arteries are patent bilaterally. POSTERIOR CIRCULATION: No significant stenosis, proximal occlusion, aneurysm, or vascular malformation. PCAs: The posterior cerebral arteries are patent bilaterally. Pcomm: Not well visualized SCAs: The superior cerebellar arteries are patent bilaterally. Basilar artery: Patent AICAs: Patent PICAs: Patent Vertebral arteries: As above. Venous sinuses: As permitted by contrast timing, patent. Anatomic variants: None Review of the MIP images confirms the above findings IMPRESSION: No large vessel occlusion. No CT evidence of acute intracranial abnormality. No significant atherosclerosis of the arteries in the neck. 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 Atherosclerosis involving the left vertebral artery at the V3/V4 junction and proximal V4 segment resulting in mild  stenosis. Significant degenerative changes of the cervical spine along with ossification of the posterior longitudinal ligament. Significant osseous spinal canal narrowing at multiple levels. Additional significant osseous foraminal narrowing. Recommend correlation with MRI cervical  spine. Right thyroid nodule measuring up to 3.3 cm. Recommend nonemergent correlation with thyroid ultrasound if not previously performed. Electronically Signed   By: Denny Flack M.D.   On: 08/04/2023 17:58     PHYSICAL EXAM  Temp:  [97.4 F (36.3 C)-98.5 F (36.9 C)] 98.2 F (36.8 C) (06/07 1607) Pulse Rate:  [54-75] 75 (06/07 1607) Resp:  [16-20] 20 (06/07 1607) BP: (133-165)/(66-92) 138/71 (06/07 1607) SpO2:  [95 %-100 %] 99 % (06/07 1607)  General - Well nourished, well developed, in no apparent distress.  Ophthalmologic - fundi not visualized due to noncooperation.  Cardiovascular - Regular rhythm and rate.  Neuro - awake, alert, eyes open, orientated to age, place, time and people. No aphasia, fluent language, following all simple commands. Able to name and repeat. No gaze palsy, tracking bilaterally, visual field full, PERRL. No facial droop. Tongue midline. Bilateral UEs 5/5, no drift. Bilaterally LEs 5/5, no drift. Sensation symmetrical bilaterally, b/l FTN intact, however bilateral heel-to-shin ataxic, gait not tested.  Bilateral Babinski negative, bilateral upper extremity DTR 1+, and bilateral patellar reflex 2+, ankle reflexes 1+.  No Hoffman sign.    ASSESSMENT/PLAN Ms. Kaelyn Innocent is a 73 y.o. female with history of diabetes, hypertension, hyperlipidemia, obesity, DJD admitted for neck pain radiating to back, off balance. No TNK given due to outside window.    Spinal cord compression Likely chronic, patient no significant extremity weakness, no sensory level, only has bilateral lower extremity heel-to-shin ataxic MRI C-spine showed spinal cord compression with severe bilateral  neuroforaminal stenosis Neurosurgery Dr. Larrie Po on board, recommend surgery as outpatient On steroids  Subacute stroke, incidental finding:  right CR subacute infarct likely secondary to small vessel disease source CT no acute abnormality. CT head and neck unremarkable MRI right CR small subacute infarct 2D Echo EF 60 to 65% LDL 49 HgbA1c pending UDS positive for opiates, likely medication related SCD's for VTE prophylaxis aspirin  81 mg daily prior to admission, now on aspirin  81 mg daily.  No DAPT given subacute nature of stroke and potential neurosurgical intervention Ongoing aggressive stroke risk factor management Therapy recommendations: Home health PT Disposition: Pending  Diabetes HgbA1c pending goal < 7.0 Uncontrolled Currently on insulin Hyperglycemia in the setting of steroids use CBG monitoring SSI DM education and close PCP follow up  Hypertension Stable Long term BP goal normotensive  Hyperlipidemia Home meds: Crestor 20 LDL 49, goal < 70 Now on home Crestor 20 Continue statin at discharge  Other Stroke Risk Factors Advanced age Obesity, Body mass index is 34.3 kg/m.   Other Active Problems DJD  Hospital day # 0  Neurology will sign off. Please call with questions. Pt will follow up with stroke clinic NP at [facility] in about 4 weeks. Thanks for the consult.  Consuelo Denmark, MD PhD Stroke Neurology 08/05/2023 4:14 PM  I spent additional 30 minutes inpatient face-to-face time with the patient and family, reviewing test results, images and medication, and discussing the diagnosis, treatment plan and potential prognosis. This patient's care requiresreview of multiple databases, neurological assessment, discussion with family, other specialists and medical decision making of high complexity. I had long discussion with patient and sister at bedside, updated pt current condition, treatment plan and potential prognosis, and answered all the questions.  They expressed  understanding and appreciation.        To contact Stroke Continuity provider, please refer to [email or web address]. After hours, contact General Neurology

## 2023-08-05 NOTE — ED Notes (Signed)
 This RN attempted to call 3W three times and no answer. Pt taken to 3W by ED tech

## 2023-08-05 NOTE — Plan of Care (Signed)

## 2023-08-05 NOTE — Care Management Obs Status (Signed)
 MEDICARE OBSERVATION STATUS NOTIFICATION   Patient Details  Name: Carla Stokes MRN: [id number] Date of Birth: 1951-01-10   Medicare Observation Status Notification Given:  Yes    Aneeka Bowden G., RN 08/05/2023, 8:51 AM

## 2023-08-05 NOTE — Evaluation (Signed)
 Occupational Therapy Evaluation Patient Details Name: Carla Stokes MRN: [id number] DOB: June 05, 1950 Today's Date: 08/05/2023   History of Present Illness   Pt is a 73 yo female presenting to [facility] on 08/04/23 with c/o neck pain and impaired balance. 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 PMH of HTN, DM II, obesity, DDD.     Clinical Impressions Pt admitted for above, PTA pt reports living alone and being ind with ADLs/iADLs. Ambulated no AD at baseline, pt currently needing CGA + RW support to maintain balance during functional activity. Her RLE is generally weak compared to her L, RUE noted to have decreased FMC. Strongly suggest pt DC with RW and BSC upon return home with family providing supervision to reduce risk of fall and injury. Pt considering Cervical sx, provided her with handout on precautions as she requested. She completes ADLs with CGA to setup A. OT to continue following pt acutely to progress pt as able. No post acute OT recommended at this time, would need reassessment post op.      If plan is discharge home, recommend the following:   Other (comment) (supervision assist for safety at home)     Functional Status Assessment   Patient has had a recent decline in their functional status and/or demonstrates limited ability to make significant improvements in function in a reasonable and predictable amount of time     Equipment Recommendations   BSC/3in1;Other (comment) (RW)     Recommendations for Other Services         Precautions/Restrictions   Precautions Precautions: Fall Recall of Precautions/Restrictions: Intact Restrictions Weight Bearing Restrictions Per Provider Order: No     Mobility Bed Mobility Overal bed mobility: Needs Assistance Bed Mobility: Rolling, Sidelying to Sit, Sit to Supine Rolling: Supervision Sidelying to sit: Contact guard assist   Sit to supine: Supervision        Transfers Overall  transfer level: Needs assistance Equipment used: Rolling walker (2 wheels), None Transfers: Sit to/from Stand Sit to Stand: Mod assist           General transfer comment: initial STS was mod A from EOB no AD. Performed two more STS with RW, pt progressing to CGA with cues for hand placement.      Balance Overall balance assessment: Needs assistance Sitting-balance support: No upper extremity supported, Feet supported Sitting balance-Leahy Scale: Good Sitting balance - Comments: supervision static sitting EOB   Standing balance support: Bilateral upper extremity supported, During functional activity Standing balance-Leahy Scale: Poor                             ADL either performed or assessed with clinical judgement   ADL Overall ADL's : Needs assistance/impaired Eating/Feeding: Independent;Sitting   Grooming: Standing;Wash/dry face;Contact guard assist   Upper Body Bathing: Sitting;Set up   Lower Body Bathing: Sitting/lateral leans;Set up   Upper Body Dressing : Sitting;Set up Upper Body Dressing Details (indicate cue type and reason): don gown like jacket Lower Body Dressing: Sitting/lateral leans;Contact guard assist Lower Body Dressing Details (indicate cue type and reason): figure four to don/doff socks. Toilet Transfer: Contact guard assist;Rolling walker (2 wheels);Ambulation   Toileting- Clothing Manipulation and Hygiene: Contact guard assist;Sit to/from stand       Functional mobility during ADLs: Contact guard assist;Rolling walker (2 wheels) General ADL Comments: Initial gait no AD, pt needing min A HHA. New learning for RW, cues for hand placement and proximity  to RW     Vision Baseline Vision/History: 4 Cataracts Ability to See in Adequate Light: 1 Impaired Patient Visual Report: Blurring of vision Tracking/Visual Pursuits: Able to track stimulus in all quads without difficulty Convergence: Within functional limits Visual Fields: No  apparent deficits Additional Comments: Blurry vision in L eye, pt reports cataracts.     Perception Perception: Within Functional Limits       Praxis Praxis: WFL       Pertinent Vitals/Pain Pain Assessment Pain Assessment: Faces Faces Pain Scale: Hurts little more Pain Location: R shoulder with full AROM Pain Descriptors / Indicators: Discomfort, Sharp, Shooting, Grimacing Pain Intervention(s): Monitored during session, Limited activity within patient's tolerance     Extremity/Trunk Assessment Upper Extremity Assessment Upper Extremity Assessment: RUE deficits/detail;Generalized weakness;Right hand dominant RUE Deficits / Details: weak grasp compared to L, MMT 4/5 throughout. pain with full shoulder AROM. RUE: Shoulder pain with ROM RUE Sensation: WNL RUE Coordination: decreased fine motor   Lower Extremity Assessment Lower Extremity Assessment: RLE deficits/detail;Generalized weakness       Communication Communication Communication: No apparent difficulties   Cognition Arousal: Alert Behavior During Therapy: WFL for tasks assessed/performed Cognition: No apparent impairments                               Following commands: Intact       Cueing  General Comments   Cueing Techniques: Verbal cues  VSS; pt sister present and supportive. Pt requested copy of Cspine precautions in the event that she elects to do sx down the line, provided them with handout.   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 1 fall in the last 6 months, lost balance.      Prior Functioning/Environment Prior Level of Function : Driving;Independent/Modified Independent;Working/employed;History of Falls (last six months)             Mobility Comments: ind no AD ADLs Comments: ind    OT Problem List: Impaired balance (sitting and/or standing);Decreased strength;Pain   OT Treatment/Interventions: Therapeutic exercise;Patient/family education;Manual therapy;Balance training;Therapeutic activities;DME and/or AE instruction      OT Goals(Current goals can be found in the care plan section)   Acute Rehab OT Goals Patient Stated Goal: To get better OT Goal Formulation: With patient Time For Goal Achievement: 08/19/23 Potential to Achieve Goals: Good ADL Goals Pt Will Perform Grooming: with modified independence;standing Pt Will Perform Lower Body Bathing: with modified independence;sitting/lateral leans Pt Will Perform Lower Body Dressing: with modified independence;sit to/from stand Pt Will Transfer to Toilet: with modified independence;ambulating;bedside commode   OT Frequency:  Min 2X/week    Co-evaluation              AM-PAC OT "6 Clicks" Daily Activity     Outcome Measure Help from another person eating meals?: None Help from another person taking care of personal grooming?: A Little Help from another person toileting, which includes using toliet, bedpan, or urinal?:  A Little Help from another person bathing (including washing, rinsing, drying)?: A Little Help from another person to put on and taking off regular upper body clothing?: A Little Help from another person to put on and taking off regular lower body clothing?: A Little 6 Click Score: 19   End of Session Equipment Utilized During Treatment: Gait belt;Rolling walker (2 wheels) Nurse Communication: Mobility status  Activity Tolerance: Patient tolerated treatment well Patient left:  in bed;with call bell/phone within reach;with bed alarm set  OT Visit Diagnosis: Unsteadiness on feet (R26.81);Muscle weakness (generalized) (M62.81);Pain Pain - Right/Left: Right Pain - part of body: Shoulder                Time: 1020-1055 OT Time Calculation (min): 35 min Charges:  OT General Charges $OT Visit: 1 Visit OT Evaluation $OT Eval Low Complexity: 1 Low OT Treatments $Self Care/Home Management : 8-22 mins  08/05/2023  AB, OTR/L  Acute Rehabilitation Services  Office: [phone]   Jorene New 08/05/2023, 11:45 AM

## 2023-08-05 NOTE — Evaluation (Addendum)
 Physical Therapy Evaluation Patient Details Name: Carla Stokes MRN: [id number] DOB: [birth year] Today's Date: 08/05/2023  History of Present Illness  Pt is a 73 yo female presenting to [facility] on 08/04/23 with c/o neck pain and impaired balance. 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 PMH of HTN, DM II, obesity, DDD.  Clinical Impression  Pt presents with condition above and deficits mentioned below, see PT Problem List. PTA, she was independent without DME, living alone in a 1-level house with a level entrance. Currently, the pt is demonstrating deficits in balance, activity tolerance, and cognition. She appears to be speaking more out of her L side of her mouth than her R and appeared to have deficits in cognition, which OT reports is different compared to earlier. RN notified. Currently, she is able to perform all functional mobility without LOB with CGA for safety only when utilizing a RW. When trying to ambulate without UE support, she tends to take narrow steps and stagger and requires up to minA to maintain her balance. She is at risk for falls and injuries. Provided she has someone to assist her with cognitive tasks at d/c to ensure her safety, then she could d/c home with HHPT follow-up. Will continue to follow acutely.        If plan is discharge home, recommend the following: A little help with walking and/or transfers;A little help with bathing/dressing/bathroom;Assistance with cooking/housework;Direct supervision/assist for medications management;Direct supervision/assist for financial management;Assist for transportation   Can travel by private vehicle        Equipment Recommendations Rolling walker (2 wheels)  Recommendations for Other Services       Functional Status Assessment Patient has had a recent decline in their functional status and demonstrates the ability to make significant improvements in function in a reasonable and predictable amount of time.      Precautions / Restrictions Precautions Precautions: Fall Restrictions Weight Bearing Restrictions Per Provider Order: No      Mobility  Bed Mobility Overal bed mobility: Needs Assistance Bed Mobility: Sit to Supine       Sit to supine: Supervision, HOB elevated, Used rails   General bed mobility comments: Supervision for safety    Transfers Overall transfer level: Needs assistance Equipment used: Rolling walker (2 wheels) Transfers: Sit to/from Stand Sit to Stand: Contact guard assist           General transfer comment: Pt was able to transfer to stand from EOB to RW with CGA for safety, no LOB    Ambulation/Gait Ambulation/Gait assistance: Contact guard assist, Min assist Gait Distance (Feet): 130 Feet Assistive device: Rolling walker (2 wheels), None Gait Pattern/deviations: Step-through pattern, Decreased stride length, Narrow base of support, Trunk flexed, Staggering left, Staggering right Gait velocity: reduced Gait velocity interpretation: <1.31 ft/sec, indicative of household ambulator   General Gait Details: Pt ambulates steadily with a RW with CGA for safety. When trying to ambulate without UE support, she tends to have a narrow stance and stagger bil, needing visual cues to place one foot in one tile and other foot in another tile to widen her stance and improve her balance. Fair carryover noted. MinA needed for balance when not utilizing an AD.  Stairs            Wheelchair Mobility     Tilt Bed    Modified Rankin (Stroke Patients Only) Modified Rankin (Stroke Patients Only) Pre-Morbid Rankin Score: No symptoms Modified Rankin: Moderately severe disability  Balance Overall balance assessment: Needs assistance Sitting-balance support: No upper extremity supported, Feet supported Sitting balance-Leahy Scale: Good     Standing balance support: Bilateral upper extremity supported, No upper extremity supported, During functional  activity Standing balance-Leahy Scale: Poor Standing balance comment: Reliant on RW or minA for balance to ambulate                             Pertinent Vitals/Pain Pain Assessment Pain Assessment: Faces Faces Pain Scale: No hurt Pain Intervention(s): Monitored during session    Home Living Family/patient expects to be discharged to:: Private residence Living Arrangements: Alone Available Help at Discharge: Family;Available PRN/intermittently (son lives across the street.) Type of Home: House Home Access: Level entry (through the front)       Home Layout: One level Home Equipment: Cane - single point;Hand held shower head;Grab bars - tub/shower;Shower seat 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 1 fall in the last 6 months, lost balance.    Prior Function Prior Level of Function : Driving;Independent/Modified Independent;Working/employed;History of Falls (last six months)             Mobility Comments: ind no AD ADLs Comments: ind     Extremity/Trunk Assessment   Upper Extremity Assessment Upper Extremity Assessment: Defer to OT evaluation RUE Deficits / Details: weak grasp compared to L, MMT 4/5 throughout. pain with full shoulder AROM. RUE: Shoulder pain with ROM RUE Sensation: WNL RUE Coordination: decreased fine motor    Lower Extremity Assessment Lower Extremity Assessment:  (grossly symmetrical with MMT scores of 4+ to 5 bil; denied numbness/tingling bil)    Cervical / Trunk Assessment Cervical / Trunk Assessment: Normal  Communication   Communication Communication: No apparent difficulties (questionably talking more out of her L side of her mouth than her R? - notified RN)    Cognition Arousal: Alert Behavior During Therapy: WFL for tasks assessed/performed   PT - Cognitive impairments: Attention, Safety/Judgement, Problem solving                       PT - Cognition Comments: Pt reports feeling "cloudy"  minded and does appear to have attention deficits, needing redirecting at times. Delayed processing noted. Following commands: Impaired Following commands impaired: Follows multi-step commands with increased time     Cueing Cueing Techniques: Verbal cues, Visual cues, Tactile cues     General Comments General comments (skin integrity, edema, etc.): educated pt and sister on her risk for falls and recs to use RW at d/c, they verbalized understanding    Exercises     Assessment/Plan    PT Assessment Patient needs continued PT services  PT Problem List Decreased activity tolerance;Decreased balance;Decreased mobility;Decreased cognition       PT Treatment Interventions DME instruction;Gait training;Functional mobility training;Therapeutic exercise;Therapeutic activities;Balance training;Neuromuscular re-education;Patient/family education;Cognitive remediation    PT Goals (Current goals can be found in the Care Plan section)  Acute Rehab PT Goals Patient Stated Goal: to improve PT Goal Formulation: With patient/family Time For Goal Achievement: 08/19/23 Potential to Achieve Goals: Good    Frequency Min 2X/week     Co-evaluation               AM-PAC PT "6 Clicks" Mobility  Outcome Measure Help needed turning from your back to your side while in a flat bed without using bedrails?: A Little Help needed moving from lying on your back to sitting on the  side of a flat bed without using bedrails?: A Little Help needed moving to and from a bed to a chair (including a wheelchair)?: A Little Help needed standing up from a chair using your arms (e.g., wheelchair or bedside chair)?: A Little Help needed to walk in hospital room?: A Little Help needed climbing 3-5 steps with a railing? : A Little 6 Click Score: 18    End of Session Equipment Utilized During Treatment: Gait belt Activity Tolerance: Patient tolerated treatment well Patient left: in bed;with call bell/phone within  reach;with bed alarm set;with family/visitor present Nurse Communication: Mobility status;Other (comment) (questionable change in mental status and talking more out L side of mouth compared to prior OT session after discussion with OT) PT Visit Diagnosis: Unsteadiness on feet (R26.81);Other abnormalities of gait and mobility (R26.89);Difficulty in walking, not elsewhere classified (R26.2);Other symptoms and signs involving the nervous system (R29.898)    Time: 5621-3086 PT Time Calculation (min) (ACUTE ONLY): 13 min   Charges:   PT Evaluation $PT Eval Moderate Complexity: 1 Mod   PT General Charges $$ ACUTE PT VISIT: 1 Visit         Vernida Goodie, PT, DPT Acute Rehabilitation Services  Office: [phone]   Ellyn Hack 08/05/2023, 2:54 PM

## 2023-08-05 NOTE — H&P (Signed)
 History and Physical    Carla Stokes:[id number] DOB: 10/16/1950 DOA: 08/04/2023  PCP: Madlyn Schirmer, MD  Patient coming from: Home  Chief Complaint: Neck pain  HPI: Carla Stokes is a 73 y.o. female with medical history significant of hypertension, hyperlipidemia, insulin-dependent type 2 diabetes, obesity presented to the ED with a chief complaint of neck pain.  Patient reports having pain on the right side of her neck which has been ongoing for the past 1 month and has gotten progressively worse.  For the past few days she has had difficulty sleeping due to severe neck pain.  No falls or neck injury reported.  States she has been seen by urgent care, outside hospital ED, and her PCP over the past month and has been prescribed medications/patches for pain which have not helped.  She had an x-ray of her neck done and was told that she had arthritis.  She also reports feeling off balance when she walks but patient states this is a chronic issue since after she had a foot infection a few years ago.  Denies any weakness or paresthesias of her upper or lower extremities.  Denies fevers, chills, cough, shortness of breath, chest pain, nausea, vomiting, abdominal pain, or diarrhea.  ED Course: CTA head and neck negative for acute intracranial abnormality or LVO but does show significant degenerative changes of the cervical spine and significant osseous spinal canal narrowing at multiple levels and additional significant osseous foraminal narrowing.  Right thyroid nodule measuring 3.3 cm seen.    Brain of MRI and C-spine showing: "IMPRESSION: 1. 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 No acute intracranial abnormality. 2. Severe spinal canal stenosis with compression of the spinal cord at C2-3, C3-4, C4-5 and C5-6. 3. Severe bilateral neural foraminal stenosis at C3-4, C4-5 and C5-6. 4. Severe left C6-7 neural foraminal stenosis."  No significant abnormalities on CBC  and CMP.  ED PA discussed the case with Dr. Larrie Po with neurosurgery who recommended treating with IV steroids and neurosurgery service available to consult in the morning if needed.  Patient was given Solu-Medrol  125 mg and morphine .  [facility] called to admit.  Review of Systems:  Review of Systems  All other systems reviewed and are negative.   Past Medical History:  Diagnosis Date   Arthritis    Diabetes mellitus without complication (HCC)    High cholesterol    Hypertension     Past Surgical History:  Procedure Laterality Date   ABDOMINAL AORTOGRAM W/LOWER EXTREMITY N/A 02/03/2022   Procedure: ABDOMINAL AORTOGRAM W/LOWER EXTREMITY;  Surgeon: Young Hensen, MD;  Location: [facility];  Service: Cardiovascular;  Laterality: N/A;   ABDOMINAL HYSTERECTOMY     BONE BIOPSY Right 03/10/2022   Procedure: BONE BIOPSY;  Surgeon: Dot Gazella, [name];  Location: [facility];  Service: Podiatry;  Laterality: Right;   WOUND DEBRIDEMENT Right 03/10/2022   Procedure: DEBRIDEMENT WOUND/ULCER;  Surgeon: Dot Gazella, [name];  Location: [facility];  Service: Podiatry;  Laterality: Right;     reports that she has never smoked. She has never been exposed to tobacco smoke. She has never used smokeless tobacco. She reports that she does not currently use alcohol. She reports that she does not use drugs.  Allergies  Allergen Reactions   Codeine     Other Reaction(s): Dizziness (intolerance)   Dapagliflozin Other (See Comments)    2017: yeast infection   Latex Itching    History reviewed. No pertinent family history.  Prior to Admission medications   Medication Sig Start Date End Date Taking? Authorizing Provider  acetaminophen  (TYLENOL ) 500 MG tablet Take 500-1,000 mg by mouth every 6 (six) hours as needed for moderate pain.   Yes [provider]  amLODipine (NORVASC) 5 MG tablet Take 5 mg by mouth daily.   Yes [provider]  aspirin  EC 81 MG tablet Take 81 mg by mouth  daily. Swallow whole.   Yes [provider]  insulin glargine, 1 Unit Dial, (TOUJEO SOLOSTAR) 300 UNIT/ML Solostar Pen Inject 80 Units into the skin at bedtime.   Yes [provider]  Multiple Vitamin (MULTIVITAMIN WITH MINERALS) TABS tablet Take 1 tablet by mouth daily.   Yes [provider]  OZEMPIC, 2 MG/DOSE, 8 MG/3ML SOPN Inject 2 mg into the skin every Wednesday. 08/16/22  Yes [provider]  rosuvastatin (CRESTOR) 20 MG tablet Take 20 mg by mouth daily. 01/12/22  Yes [provider]  valsartan-hydrochlorothiazide (DIOVAN-HCT) 320-25 MG tablet Take 1 tablet by mouth daily. 01/12/22  Yes [provider]  zinc gluconate 50 MG tablet Take 50 mg by mouth daily.   Yes [provider]  Alcohol Swabs (B-D SINGLE USE SWABS REGULAR) PADS  09/13/18   [provider]    Physical Exam: Vitals:   08/04/23 1701 08/04/23 2225 08/04/23 2358 08/05/23 0207  BP: (!) 146/92 (!) 165/83 (!) 159/71 (!) 140/66  Pulse: 64 69 (!) 58 (!) 54  Resp: 16 18 18 17   Temp: (!) 97.4 F (36.3 C) 97.9 F (36.6 C)  98 F (36.7 C)  TempSrc:  Oral  Oral  SpO2: 99% 100% 100% 100%  Weight:        Physical Exam Vitals reviewed.  Constitutional:      General: She is not in acute distress. HENT:     Head: Normocephalic and atraumatic.  Eyes:     Extraocular Movements: Extraocular movements intact.  Neck:     Comments: Range of motion limited secondary to pain Cardiovascular:     Rate and Rhythm: Normal rate and regular rhythm.     Pulses: Normal pulses.  Pulmonary:     Effort: Pulmonary effort is normal. No respiratory distress.     Breath sounds: Normal breath sounds. No wheezing or rales.  Abdominal:     General: Bowel sounds are normal. There is no distension.     Palpations: Abdomen is soft.     Tenderness: There is no abdominal tenderness. There is no guarding.  Musculoskeletal:     Right lower leg: No edema.     Left lower leg: No  edema.  Skin:    General: Skin is warm and dry.  Neurological:     General: No focal deficit present.     Mental Status: She is alert and oriented to person, place, and time.     Cranial Nerves: No cranial nerve deficit.     Sensory: No sensory deficit.     Motor: No weakness.     Labs on Admission: I have personally reviewed following labs and imaging studies  CBC: Recent Labs  Lab 08/04/23 1627 08/04/23 1629  WBC  --  5.9  NEUTROABS  --  3.8  HGB 14.3 14.4  HCT 42.0 42.3  MCV  --  82.3  PLT  --  187   Basic Metabolic Panel: Recent Labs  Lab 08/04/23 1627 08/04/23 1629  NA 144 145  K 3.8 3.9  CL 103 104  CO2  --  28  GLUCOSE 106* 109*  BUN 12 9  CREATININE 1.10* 1.05*  CALCIUM  --  9.9   GFR: Estimated Creatinine Clearance: 60.4 mL/min (A) (by C-G formula based on SCr of 1.05 mg/dL (H)). Liver Function Tests: Recent Labs  Lab 08/04/23 1629  AST 34  ALT 37  ALKPHOS 54  BILITOT 0.5  PROT 7.1  ALBUMIN 4.2   No results for input(s): "LIPASE", "AMYLASE" in the last 168 hours. No results for input(s): "AMMONIA" in the last 168 hours. Coagulation Profile: No results for input(s): "INR", "PROTIME" in the last 168 hours. Cardiac Enzymes: No results for input(s): "CKTOTAL", "CKMB", "CKMBINDEX", "TROPONINI" in the last 168 hours. BNP (last 3 results) No results for input(s): "PROBNP" in the last 8760 hours. HbA1C: No results for input(s): "HGBA1C" in the last 72 hours. CBG: No results for input(s): "GLUCAP" in the last 168 hours. Lipid Profile: No results for input(s): "CHOL", "HDL", "LDLCALC", "TRIG", "CHOLHDL", "LDLDIRECT" in the last 72 hours. Thyroid Function Tests: No results for input(s): "TSH", "T4TOTAL", "FREET4", "T3FREE", "THYROIDAB" in the last 72 hours. Anemia Panel: No results for input(s): "VITAMINB12", "FOLATE", "FERRITIN", "TIBC", "IRON", "RETICCTPCT" in the last 72 hours. Urine analysis: No results found for: "COLORURINE", "APPEARANCEUR",  "LABSPEC", "PHURINE", "GLUCOSEU", "HGBUR", "BILIRUBINUR", "KETONESUR", "PROTEINUR", "UROBILINOGEN", "NITRITE", "LEUKOCYTESUR"  Radiological Exams on Admission: MR BRAIN WO CONTRAST Result Date: 08/04/2023 CLINICAL DATA:  Acute neurologic deficit EXAM: MRI HEAD WITHOUT CONTRAST MRI CERVICAL SPINE WITHOUT CONTRAST TECHNIQUE: Multiplanar, multiecho pulse sequences of the brain and surrounding structures, and cervical spine, to include the craniocervical junction and cervicothoracic junction, were obtained without intravenous contrast. COMPARISON:  CTA head neck 08/04/2023 FINDINGS: MRI HEAD FINDINGS Brain: There is hyperintensity on diffusion-weighted imaging within the right centrum semiovale. No associated ADC correlate. Chronic microhemorrhage in the medial left temporal lobe. There is multifocal hyperintense T2-weighted signal within the white matter. Parenchymal volume and CSF spaces are normal. The midline structures are normal. Vascular: Normal flow voids. Skull and upper cervical spine: Normal calvarium and skull base. Visualized upper cervical spine and soft tissues are normal. Sinuses/Orbits:No paranasal sinus fluid levels or advanced mucosal thickening. No mastoid or middle ear effusion. Normal orbits. MRI CERVICAL SPINE FINDINGS Alignment: Grade 1 retrolisthesis at C5-6 Vertebrae: No acute abnormality. There is flowing anterior osteophytosis at C4-6. Cord: Normal signal and morphology. Posterior Fossa, vertebral arteries, paraspinal tissues: Negative. Disc levels: C1-2: Unremarkable. 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 Severe spinal canal stenosis with compression of the spinal cord. No neural foraminal stenosis. C3-4: Ossification of the posterior longitudinal ligament. Severe spinal canal stenosis. There is compression of the spinal cord. Severe bilateral neural foraminal stenosis. C4-5: Ossification of posterior longitudinal ligament. Severe spinal canal stenosis. There is  compression of the spinal cord. Severe bilateral neural foraminal stenosis. C5-6: Ossification of the posterior longitudinal ligament. Severe spinal canal stenosis. Spinal cord compression. Severe bilateral neural foraminal stenosis. C6-7: Small disc bulge. Moderate spinal canal stenosis. Severe left neural foraminal stenosis. C7-T1: Intermediate sized central disc protrusion. There is no spinal canal stenosis. No neural foraminal stenosis. IMPRESSION: 1. 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 No acute intracranial abnormality. 2. Severe spinal canal stenosis with compression of the spinal cord at C2-3, C3-4, C4-5 and C5-6. 3. Severe bilateral neural foraminal stenosis at C3-4, C4-5 and C5-6. 4. Severe left C6-7 neural foraminal stenosis. Electronically Signed   By: Juanetta Nordmann M.D.   On: 08/04/2023 21:48   MR Cervical Spine Wo Contrast Result Date: 08/04/2023 CLINICAL DATA:  Acute neurologic deficit EXAM: MRI HEAD WITHOUT CONTRAST MRI CERVICAL SPINE WITHOUT CONTRAST TECHNIQUE: Multiplanar, multiecho pulse sequences of the brain and surrounding structures, and cervical spine, to include the craniocervical junction and cervicothoracic junction, were obtained without intravenous contrast. COMPARISON:  CTA head neck 08/04/2023 FINDINGS: MRI HEAD FINDINGS Brain: There is hyperintensity on diffusion-weighted imaging within the right centrum semiovale. No associated ADC correlate. Chronic microhemorrhage in the medial left temporal lobe. There is multifocal hyperintense T2-weighted signal within the white matter. Parenchymal volume and CSF spaces are normal. The midline structures are normal. Vascular: Normal flow voids. Skull and upper cervical spine: Normal calvarium and skull base. Visualized upper cervical spine and soft tissues are normal. Sinuses/Orbits:No paranasal sinus fluid levels or advanced mucosal thickening. No mastoid or middle ear effusion. Normal orbits. MRI CERVICAL SPINE FINDINGS  Alignment: Grade 1 retrolisthesis at C5-6 Vertebrae: No acute abnormality. There is flowing anterior osteophytosis at C4-6. Cord: Normal signal and morphology. Posterior Fossa, vertebral arteries, paraspinal tissues: Negative. Disc levels: C1-2: Unremarkable. 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 Severe spinal canal stenosis with compression of the spinal cord. No neural foraminal stenosis. C3-4: Ossification of the posterior longitudinal ligament. Severe spinal canal stenosis. There is compression of the spinal cord. Severe bilateral neural foraminal stenosis. C4-5: Ossification of posterior longitudinal ligament. Severe spinal canal stenosis. There is compression of the spinal cord. Severe bilateral neural foraminal stenosis. C5-6: Ossification of the posterior longitudinal ligament. Severe spinal canal stenosis. Spinal cord compression. Severe bilateral neural foraminal stenosis. C6-7: Small disc bulge. Moderate spinal canal stenosis. Severe left neural foraminal stenosis. C7-T1: Intermediate sized central disc protrusion. There is no spinal canal stenosis. No neural foraminal stenosis. IMPRESSION: 1. 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 No acute intracranial abnormality. 2. Severe spinal canal stenosis with compression of the spinal cord at C2-3, C3-4, C4-5 and C5-6. 3. Severe bilateral neural foraminal stenosis at C3-4, C4-5 and C5-6. 4. Severe left C6-7 neural foraminal stenosis. Electronically Signed   By: Juanetta Nordmann M.D.   On: 08/04/2023 21:48   CT ANGIO HEAD NECK W WO CM Result Date: 08/04/2023 CLINICAL DATA:  Right-sided neck pain, pain radiating to back causing difficulty sleeping, balance difficulty. EXAM: CT ANGIOGRAPHY HEAD AND NECK WITH AND WITHOUT CONTRAST TECHNIQUE: Multidetector CT imaging of the head and neck was performed using the standard protocol during bolus administration of intravenous contrast. Multiplanar CT image reconstructions and MIPs were  obtained to evaluate the vascular anatomy. Carotid stenosis measurements (when applicable) are obtained utilizing NASCET criteria, using the distal internal carotid diameter as the denominator. RADIATION DOSE REDUCTION: This exam was performed according to the departmental dose-optimization program which includes automated exposure control, adjustment of the mA and/or kV according to patient size and/or use of iterative reconstruction technique. CONTRAST:  75mL OMNIPAQUE  IOHEXOL  350 MG/ML SOLN COMPARISON:  None Available. FINDINGS: CT HEAD FINDINGS Brain: No acute intracranial hemorrhage. No CT evidence of acute infarct. No edema, mass effect, or midline shift. The basilar cisterns are patent. Ventricles: Ventricles are normal in size and configuration. Vascular: No hyperdense vessel. Intracranial atherosclerotic calcifications noted. Skull: No acute or aggressive finding. Sinuses/orbits: Left lens replacement. Orbits otherwise unremarkable. Mucous retention cyst in the partially visualized left maxillary sinus. Other: Mastoid air cells are clear. CTA NECK FINDINGS Aortic arch: Common origin of the brachiocephalic and left common carotid arteries. Imaged portion shows no evidence of aneurysm or dissection. No significant stenosis of the major arch vessel origins. Pulmonary arteries: As permitted by contrast timing, there  are no filling defects in the visualized pulmonary arteries. Subclavian arteries: Patent bilaterally. Mild atherosclerosis at the origin of the brachiocephalic artery and along the proximal right subclavian without significant stenosis. Additional mild atherosclerosis at the origin of the left subclavian artery without significant stenosis. Right carotid system: No evidence of dissection, stenosis (50% or greater), or occlusion. Minimal calcified atherosclerosis at the carotid bifurcation. Left carotid system: No evidence of dissection, stenosis (50% or greater), or occlusion. Minimal calcified  atherosclerosis at the carotid bifurcation. Vertebral arteries: Codominant. Patent from the origins to the vertebrobasilar confluence. No evidence of occlusion or dissection. Atherosclerosis involving the left vertebral artery at the V3/V4 junction extending into the proximal V4 segment resulting in mild stenosis. Skeleton: No acute findings. Degenerative changes in the cervical spine. 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 There is significant spinal canal stenosis appreciated at C2-3, C3-4, C4-5, and C5-6. There is additional ossification of the posterior longitudinal ligament at C7-T1 with at least mild spinal canal stenosis. Facet arthrosis at multiple levels in the cervical spine there is significant foraminal narrowing at multiple levels particularly at C3-4 and C4-5. Other neck: The visualized airway is patent. No cervical lymphadenopathy. Large right thyroid nodule measuring at least 3.3 cm in diameter. Upper chest: Visualized lung apices are clear. Review of the MIP images confirms the above findings CTA HEAD FINDINGS ANTERIOR CIRCULATION: The intracranial ICAs are patent bilaterally. Mild atherosclerosis of the bilateral carotid siphons. Mild stenosis of the bilateral cavernous ICAs. Additional moderate stenosis of the left supraclinoid ICA. No proximal occlusion, aneurysm, or vascular malformation. MCAs: The middle cerebral arteries are patent bilaterally. ACAs: The anterior cerebral arteries are patent bilaterally. POSTERIOR CIRCULATION: No significant stenosis, proximal occlusion, aneurysm, or vascular malformation. PCAs: The posterior cerebral arteries are patent bilaterally. Pcomm: Not well visualized SCAs: The superior cerebellar arteries are patent bilaterally. Basilar artery: Patent AICAs: Patent PICAs: Patent Vertebral arteries: As above. Venous sinuses: As permitted by contrast timing, patent. Anatomic variants: None  Review of the MIP images confirms the above findings IMPRESSION: No large vessel occlusion. No CT evidence of acute intracranial abnormality. No significant atherosclerosis of the arteries in the neck. 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 Atherosclerosis involving the left vertebral artery at the V3/V4 junction and proximal V4 segment resulting in mild stenosis. Significant degenerative changes of the cervical spine along with ossification of the posterior longitudinal ligament. Significant osseous spinal canal narrowing at multiple levels. Additional significant osseous foraminal narrowing. Recommend correlation with MRI cervical spine. Right thyroid nodule measuring up to 3.3 cm. Recommend nonemergent correlation with thyroid ultrasound if not previously performed. Electronically Signed   By: Denny Flack M.D.   On: 08/04/2023 17:58    Assessment and Plan  Subacute stroke (right centrum semiovale infarct) -Neurology consulting, appreciate recommendations -Frequent neurochecks per stroke protocol -Echocardiogram -A1c, lipid panel -Aspirin  81 mg daily -Telemetry monitoring for arrhythmia -Bedside swallow screen prior to p.o. intake -Stroke education booklet -PT/OT/SLP consult  Severe neck pain secondary to severe multilevel cervical spinal canal stenosis resulting in cord compression, severe multilevel bilateral neural foraminal stenosis No upper extremity weakness or paresthesias.  ED PA had discussed with neurosurgery and patient was given a dose of Solu-Medrol  125 mg.  Continue pain management and formally consult neurosurgery in the morning.  Incidental thyroid nodule Thyroid function tests and thyroid ultrasound ordered for further evaluation.  Hypertension SBP currently in the 140s.  Continue amlodipine.  Hold valsartan-hydrochlorothiazide and aim for gradual  normotension.  Hyperlipidemia Continue Crestor.  Insulin-dependent type 2 diabetes Last A1c 6.0 in December 2024, repeat ordered.  Continue home long-acting insulin and placed on sensitive sliding scale insulin.  DVT prophylaxis: SCDs until patient is evaluated by neurosurgery Code Status: Full Code (discussed with the patient) Family Communication: Patient's sister and nephew at bedside. Level of care: Telemetry bed Admission status: It is my clinical opinion that referral for OBSERVATION is reasonable and necessary in this patient based on the above information provided. The aforementioned taken together are felt to place the patient at high risk for further clinical deterioration. However, it is anticipated that the patient may be medically stable for discharge from the hospital within 24 to 48 hours.  Juliette Oh MD [facility]  If 7PM-7AM, please contact night-coverage [email or web address]  08/05/2023, 6:09 AM

## 2023-08-05 NOTE — Progress Notes (Signed)
  PROGRESS NOTE  Patient admitted earlier this morning. See H&P.   Pt reports stiffness and pain in her neck.  Neurosurgery saw the patient and recommended outpatient surgery.  Await PT/OT recommendations prior to discharge.  Hopefully home tomorrow. Add Robaxin.  Jobe Mulder, DO [facility] 08/05/2023, 11:34 AM  Available via Epic secure chat 7am-7pm After these hours, please refer to coverage provider listed on [email or web address]

## 2023-08-06 DIAGNOSIS — I639 Cerebral infarction, unspecified: Secondary | ICD-10-CM | POA: Diagnosis not present

## 2023-08-06 LAB — CBC
HCT: 39.4 % (ref 36.0–46.0)
Hemoglobin: 13.7 g/dL (ref 12.0–15.0)
MCH: 27.3 pg (ref 26.0–34.0)
MCHC: 34.8 g/dL (ref 30.0–36.0)
MCV: 78.5 fL — ABNORMAL LOW (ref 80.0–100.0)
Platelets: 207 10*3/uL (ref 150–400)
RBC: 5.02 MIL/uL (ref 3.87–5.11)
RDW: 13.6 % (ref 11.5–15.5)
WBC: 9.9 10*3/uL (ref 4.0–10.5)
nRBC: 0 % (ref 0.0–0.2)

## 2023-08-06 LAB — BASIC METABOLIC PANEL WITH GFR
Anion gap: 9 (ref 5–15)
BUN: 17 mg/dL (ref 8–23)
CO2: 27 mmol/L (ref 22–32)
Calcium: 9.2 mg/dL (ref 8.9–10.3)
Chloride: 103 mmol/L (ref 98–111)
Creatinine, Ser: 1 mg/dL (ref 0.44–1.00)
GFR, Estimated: >60 mL/min (ref >60)
Glucose, Bld: 127 mg/dL — ABNORMAL HIGH (ref 70–99)
Potassium: 3.7 mmol/L (ref 3.5–5.1)
Sodium: 139 mmol/L (ref 135–145)

## 2023-08-06 LAB — GLUCOSE, CAPILLARY
Glucose-Capillary: 133 mg/dL — ABNORMAL HIGH (ref 70–99)
Glucose-Capillary: 207 mg/dL — ABNORMAL HIGH (ref 70–99)
Glucose-Capillary: 164 mg/dL — ABNORMAL HIGH (ref 70–99)
Glucose-Capillary: 149 mg/dL — ABNORMAL HIGH (ref 70–99)

## 2023-08-06 MED ORDER — GABAPENTIN 100 MG PO CAPS
100.0000 mg | ORAL_CAPSULE | Freq: Three times a day (TID) | ORAL | Status: DC
  Administered 2023-08-06 – 2023-08-08 (×6): 100 mg via ORAL
  Filled 2023-08-06 (×6): qty 1

## 2023-08-06 MED ORDER — PREDNISONE 20 MG PO TABS
40.0000 mg | ORAL_TABLET | Freq: Every day | ORAL | Status: DC
  Administered 2023-08-07 – 2023-08-09 (×3): 40 mg via ORAL
  Filled 2023-08-06 (×3): qty 2

## 2023-08-06 MED ORDER — OXYCODONE-ACETAMINOPHEN 5-325 MG PO TABS
1.0000 | ORAL_TABLET | Freq: Four times a day (QID) | ORAL | Status: DC | PRN
  Administered 2023-08-06 – 2023-08-07 (×4): 1 via ORAL
  Filled 2023-08-06 (×4): qty 1

## 2023-08-06 NOTE — Progress Notes (Addendum)
 Patient sitting on the side of the bed crying; holding her neck. Had just been medicated by night RN with muscle relaxer; prn pain medication given; per patient pain radiating around her neck and hurting; holding the right side of her neck.

## 2023-08-06 NOTE — Plan of Care (Signed)

## 2023-08-06 NOTE — Progress Notes (Signed)
 PROGRESS NOTE    Kala Ambriz  ZHY:[id number] DOB: [birth year] DOA: 08/04/2023 PCP: Madlyn Schirmer, MD     Brief Narrative:  Kathyrn Warmuth is a 73 y.o. female with medical history significant of hypertension, hyperlipidemia, insulin-dependent type 2 diabetes, obesity presented to the ED with a chief complaint of neck pain.  Patient reports having pain on the right side of her neck which has been ongoing for the past 1 month and has gotten progressively worse.  For the past few days she has had difficulty sleeping due to severe neck pain.  No falls or neck injury reported.  States she has been seen by urgent care, outside hospital ED, and her PCP over the past month and has been prescribed medications/patches for pain which have not helped.  She had an x-ray of her neck done and was told that she had arthritis.  She also reports feeling off balance when she walks but patient states this is a chronic issue since after she had a foot infection a few years ago.  Denies any weakness or paresthesias of her upper or lower extremities.    Brain MRI and C-spine showed a subacute infarct in the right centrum semiovale.  She has severe bilateral neuroforaminal stenosis and severe spinal canal stenosis with compression of the spinal cord at C2-C6.  New events last 24 hours / Subjective: Neurosurgery saw the patient and recommended outpatient surgical management.  She is still in a lot of pain, mainly on the right side of her neck.  Decreased range of motion.  Physical therapy recommended home health PT.  Morphine  was changed to oxycodone  by the neurosurgery team and gabapentin was added.  They signed off.  Neurology also signed off and recommended outpatient follow-up in optimization of lipids and sugars.  Assessment & Plan:   Principal Problem:   CVA (cerebral vascular accident) [facility])  Appreciate neurology who was signed off  Appreciate PT/OT/SLP  Aspirin  daily  LDL is 49, Crestor 20 mg  daily  Echo completed showing only mild diastolic dysfunction (grade 1)  Active Problems:   Cervical stenosis of spinal canal  Start 5-day prednisone burst 40 mg daily  Percocet 5-325 mg every 6 hours as needed for pain  Axson 750 mg every 8 hours as needed  Gabapentin 100 mg 3 times daily     Thyroid nodule   Essential hypertension  Norvasc 5 mg daily    Hyperlipidemia  Crestor as above    Type 2 diabetes mellitus (HCC)  BBI  DVT prophylaxis: SCDs Code Status: Full Family Communication: Boyfriend was bedside Coming From: Home Disposition Plan: Home with home PT Barriers to Discharge: Clinical improvement  Consultants:  Neurology Neurosurgery  Procedures:  None  Objective: Vitals:   08/05/23 1945 08/05/23 2359 08/06/23 0418 08/06/23 1126  BP: (!) 118/42 134/70 125/68 (!) 159/85  Pulse: 78 63 (!) 54 72  Resp: 17 18 18 16   Temp: 98.3 F (36.8 C) (!) 97.3 F (36.3 C) 98 F (36.7 C) 98.1 F (36.7 C)  TempSrc: Oral Oral Oral Oral  SpO2: 97% 97% 96% 100%  Weight:        Intake/Output Summary (Last 24 hours) at 08/06/2023 1337 Last data filed at 08/05/2023 2256 Gross per 24 hour  Intake 195 ml  Output --  Net 195 ml   Filed Weights   08/04/23 1313  Weight: 99.3 kg    Examination:  General exam: Appears calm and comfortable  Respiratory system: No respiratory distress.  No conversational dyspnea.  MSK: TTP w hypertonicity over the cervical paraspinal musculature and lateral neck muscles, worse on the right; decreased rotational range of motion bilaterally Extremities: Symmetric in appearance  Skin: No rashes, lesions or ulcers on exposed skin  Psychiatry: Judgement and insight appear normal. Mood & affect appropriate.   Data Reviewed: I have personally reviewed following labs and imaging studies  CBC: Recent Labs  Lab 08/04/23 1627 08/04/23 1629 08/06/23 0753  WBC  --  5.9 9.9  NEUTROABS  --  3.8  --   HGB 14.3 14.4 13.7  HCT 42.0 42.3 39.4  MCV   --  82.3 78.5*  PLT  --  187 207   Basic Metabolic Panel: Recent Labs  Lab 08/04/23 1627 08/04/23 1629 08/06/23 0753  NA 144 145 139  K 3.8 3.9 3.7  CL 103 104 103  CO2  --  28 27  GLUCOSE 106* 109* 127*  BUN 12 9 17   CREATININE 1.10* 1.05* 1.00  CALCIUM  --  9.9 9.2   GFR: Estimated Creatinine Clearance: 63.4 mL/min (by C-G formula based on SCr of 1 mg/dL).  Liver Function Tests: Recent Labs  Lab 08/04/23 1629  AST 34  ALT 37  ALKPHOS 54  BILITOT 0.5  PROT 7.1  ALBUMIN 4.2   CBG: Recent Labs  Lab 08/05/23 1136 08/05/23 1733 08/05/23 2111 08/06/23 0625 08/06/23 1129  GLUCAP 199* 244* 290* 133* 207*   Lipid Profile: Recent Labs    08/05/23 0945  CHOL 106  HDL 48  LDLCALC 49  TRIG 45  CHOLHDL 2.2   Thyroid Function Tests: Recent Labs    08/05/23 0945  TSH 0.517  FREET4 0.91    Recent Results (from the past 240 hours)  MRSA Next Gen by PCR, Nasal     Status: None   Collection Time: 08/05/23  8:20 AM   Specimen: Nasal Mucosa; Nasal Swab  Result Value Ref Range Status   MRSA by PCR Next Gen NOT DETECTED NOT DETECTED Final    Comment: (NOTE) The GeneXpert MRSA Assay (FDA approved for NASAL specimens only), is one component of a comprehensive MRSA colonization surveillance program. It is not intended to diagnose MRSA infection nor to guide or monitor treatment for MRSA infections. Test performance is not FDA approved in patients less than 36 years old. Performed at [facility], [address] [place], [place] [address]       Radiology Studies: US  THYROID Result Date: 08/06/2023 CLINICAL DATA:  Incidental on MRI. EXAM: THYROID ULTRASOUND TECHNIQUE: Ultrasound examination of the thyroid gland and adjacent soft tissues was performed. COMPARISON:  Cervical spine MRI 08/04/2023 FINDINGS: Parenchymal Echotexture: Markedly heterogenous Isthmus: 1.3 cm Right lobe: 6.5 x 3.0 x 3.4 cm Left lobe: 4.7 x 1.6 x 2.1 cm  _________________________________________________________ Estimated total number of nodules >/= 1 cm: 1 Number of spongiform nodules >/=  2 cm not described below (TR1): 0 Number of mixed cystic and solid nodules >/= 1.5 cm not described below (TR2): 0 _________________________________________________________ Nodule # 1: Location: Right; Mid Maximum size: 3.1 cm; Other 2 dimensions: 2.7 x 3.8 cm Composition: solid/almost completely solid (2) Echogenicity: isoechoic (1) Shape: not taller-than-wide (0) Margins: ill-defined (0) Echogenic foci: macrocalcifications (1) ACR TI-RADS total points: 4. ACR TI-RADS risk category: TR4 (4-6 points). ACR TI-RADS recommendations: **Given size (>/= 1.5 cm) and appearance, fine needle aspiration of this moderately suspicious nodule should be considered based on TI-RADS criteria. _________________________________________________________ Incidental note is made of a tiny  subcentimeter nodule in the left mid gland. No further follow-up for this finding. IMPRESSION: 1. Ill-defined 3.1 cm TI-RADS category 4 nodule in the right mid gland meets criteria to consider fine-needle aspiration biopsy. Biopsy is recommended. The above is in keeping with the ACR TI-RADS recommendations - J Am Coll Radiol 2017;14:587-595. Electronically Signed   By: Fernando Hoyer M.D.   On: 08/06/2023 11:04   ECHOCARDIOGRAM COMPLETE Result Date: 08/05/2023    ECHOCARDIOGRAM REPORT   Patient Name:   [facility] Date of Exam: 08/05/2023 Medical Rec #:  [id number]            Height:       67.0 in Accession #:    [id number]           Weight:       219.0 lb Date of Birth:  [birth year]            BSA:          2.102 m Patient Age:    72 years             BP:           140/66 mmHg Patient Gender: F                    HR:           77 bpm. Exam Location:  Inpatient Procedure: 2D Echo, Color Doppler, Cardiac Doppler and Intracardiac            Opacification Agent (Both Spectral and Color Flow Doppler were             utilized during procedure). Indications:    Stroke  History:        Patient has no prior history of Echocardiogram examinations.                 Risk Factors:Hypertension, Diabetes, Dyslipidemia and Obesity.  Sonographer:    Travis Friedman RDCS Referring Phys: [id number] VASUNDHRA RATHORE IMPRESSIONS  1. Left ventricular ejection fraction, by estimation, is 60 to 65%. The left ventricle has normal function. The left ventricle has no regional wall motion abnormalities. There is moderate left ventricular hypertrophy. Left ventricular diastolic parameters are consistent with Grade I diastolic dysfunction (impaired relaxation).  2. Right ventricular systolic function is normal. The right ventricular size is normal.  3. The mitral valve is abnormal. No evidence of mitral valve regurgitation. No evidence of mitral stenosis.  4. The aortic valve is tricuspid. There is moderate calcification of the aortic valve. There is moderate thickening of the aortic valve. Aortic valve regurgitation is not visualized. Aortic valve sclerosis is present, with no evidence of aortic valve stenosis.  5. The inferior vena cava is normal in size with greater than 50% respiratory variability, suggesting right atrial pressure of 3 mmHg. FINDINGS  Left Ventricle: Left ventricular ejection fraction, by estimation, is 60 to 65%. The left ventricle has normal function. The left ventricle has no regional wall motion abnormalities. Strain was performed and the global longitudinal strain is indeterminate. The left ventricular internal cavity size was normal in size. There is moderate left ventricular hypertrophy. Left ventricular diastolic parameters are consistent with Grade I diastolic dysfunction (impaired relaxation). Right Ventricle: The right ventricular size is normal. No increase in right ventricular wall thickness. Right ventricular systolic function is normal. Left Atrium: Left atrial size was normal in size. Right Atrium: Right atrial size was  normal in size. Pericardium: There is no evidence of pericardial  effusion. Mitral Valve: The mitral valve is abnormal. There is mild thickening of the mitral valve leaflet(s). There is mild calcification of the mitral valve leaflet(s). No evidence of mitral valve regurgitation. No evidence of mitral valve stenosis. Tricuspid Valve: The tricuspid valve is normal in structure. Tricuspid valve regurgitation is not demonstrated. No evidence of tricuspid stenosis. Aortic Valve: The aortic valve is tricuspid. There is moderate calcification of the aortic valve. There is moderate thickening of the aortic valve. Aortic valve regurgitation is not visualized. Aortic valve sclerosis is present, with no evidence of aortic valve stenosis. Aortic valve mean gradient measures 4.0 mmHg. Aortic valve peak gradient measures 7.1 mmHg. Aortic valve area, by VTI measures 2.89 cm. Pulmonic Valve: The pulmonic valve was normal in structure. Pulmonic valve regurgitation is trivial. No evidence of pulmonic stenosis. Aorta: The aortic root is normal in size and structure. Venous: The inferior vena cava is normal in size with greater than 50% respiratory variability, suggesting right atrial pressure of 3 mmHg. IAS/Shunts: No atrial level shunt detected by color flow Doppler. Additional Comments: 3D was performed not requiring image post processing on an independent workstation and was indeterminate.  LEFT VENTRICLE PLAX 2D LVIDd:         3.80 cm   Diastology LVIDs:         2.40 cm   LV e' medial:    6.31 cm/s LV PW:         1.20 cm   LV E/e' medial:  8.6 LV IVS:        1.30 cm   LV e' lateral:   6.96 cm/s LVOT diam:     2.00 cm   LV E/e' lateral: 7.8 LV SV:         79 LV SV Index:   38 LVOT Area:     3.14 cm  RIGHT VENTRICLE RV Basal diam:  2.90 cm RV S prime:     22.40 cm/s TAPSE (M-mode): 2.5 cm LEFT ATRIUM              Index        RIGHT ATRIUM           Index LA diam:        3.40 cm  1.62 cm/m   RA Area:     16.40 cm LA Vol (A2C):    100.0 ml 47.58 ml/m  RA Volume:   44.10 ml  20.98 ml/m LA Vol (A4C):   53.8 ml  25.60 ml/m LA Biplane Vol: 75.8 ml  36.06 ml/m  AORTIC VALVE AV Area (Vmax):    2.65 cm AV Area (Vmean):   2.66 cm AV Area (VTI):     2.89 cm AV Vmax:           133.00 cm/s AV Vmean:          87.300 cm/s AV VTI:            0.275 m AV Peak Grad:      7.1 mmHg AV Mean Grad:      4.0 mmHg LVOT Vmax:         112.00 cm/s LVOT Vmean:        73.900 cm/s LVOT VTI:          0.253 m LVOT/AV VTI ratio: 0.92  AORTA Ao Root diam: 3.10 cm Ao Asc diam:  3.70 cm MITRAL VALVE MV Area (PHT): 3.21 cm    SHUNTS MV Decel Time: 236 msec    Systemic VTI:  0.25 m MV E velocity: 54.50 cm/s  Systemic Diam: 2.00 cm MV A velocity: 74.90 cm/s MV E/A ratio:  0.73 Janelle Mediate MD Electronically signed by Janelle Mediate MD Signature Date/Time: 08/05/2023/3:28:13 PM    Final    MR BRAIN WO CONTRAST Result Date: 08/04/2023 CLINICAL DATA:  Acute neurologic deficit EXAM: MRI HEAD WITHOUT CONTRAST MRI CERVICAL SPINE WITHOUT CONTRAST TECHNIQUE: Multiplanar, multiecho pulse sequences of the brain and surrounding structures, and cervical spine, to include the craniocervical junction and cervicothoracic junction, were obtained without intravenous contrast. COMPARISON:  CTA head neck 08/04/2023 FINDINGS: MRI HEAD FINDINGS Brain: There is hyperintensity on diffusion-weighted imaging within the right centrum semiovale. No associated ADC correlate. Chronic microhemorrhage in the medial left temporal lobe. There is multifocal hyperintense T2-weighted signal within the white matter. Parenchymal volume and CSF spaces are normal. The midline structures are normal. Vascular: Normal flow voids. Skull and upper cervical spine: Normal calvarium and skull base. Visualized upper cervical spine and soft tissues are normal. Sinuses/Orbits:No paranasal sinus fluid levels or advanced mucosal thickening. No mastoid or middle ear effusion. Normal orbits. MRI CERVICAL SPINE FINDINGS  Alignment: Grade 1 retrolisthesis at C5-6 Vertebrae: No acute abnormality. There is flowing anterior osteophytosis at C4-6. Cord: Normal signal and morphology. Posterior Fossa, vertebral arteries, paraspinal tissues: Negative. Disc levels: C1-2: Unremarkable. 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 Severe spinal canal stenosis with compression of the spinal cord. No neural foraminal stenosis. C3-4: Ossification of the posterior longitudinal ligament. Severe spinal canal stenosis. There is compression of the spinal cord. Severe bilateral neural foraminal stenosis. C4-5: Ossification of posterior longitudinal ligament. Severe spinal canal stenosis. There is compression of the spinal cord. Severe bilateral neural foraminal stenosis. C5-6: Ossification of the posterior longitudinal ligament. Severe spinal canal stenosis. Spinal cord compression. Severe bilateral neural foraminal stenosis. C6-7: Small disc bulge. Moderate spinal canal stenosis. Severe left neural foraminal stenosis. C7-T1: Intermediate sized central disc protrusion. There is no spinal canal stenosis. No neural foraminal stenosis. IMPRESSION: 1. 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 No acute intracranial abnormality. 2. Severe spinal canal stenosis with compression of the spinal cord at C2-3, C3-4, C4-5 and C5-6. 3. Severe bilateral neural foraminal stenosis at C3-4, C4-5 and C5-6. 4. Severe left C6-7 neural foraminal stenosis. Electronically Signed   By: Juanetta Nordmann M.D.   On: 08/04/2023 21:48   MR Cervical Spine Wo Contrast Result Date: 08/04/2023 CLINICAL DATA:  Acute neurologic deficit EXAM: MRI HEAD WITHOUT CONTRAST MRI CERVICAL SPINE WITHOUT CONTRAST TECHNIQUE: Multiplanar, multiecho pulse sequences of the brain and surrounding structures, and cervical spine, to include the craniocervical junction and cervicothoracic junction, were obtained without intravenous contrast. COMPARISON:  CTA head neck 08/04/2023  FINDINGS: MRI HEAD FINDINGS Brain: There is hyperintensity on diffusion-weighted imaging within the right centrum semiovale. No associated ADC correlate. Chronic microhemorrhage in the medial left temporal lobe. There is multifocal hyperintense T2-weighted signal within the white matter. Parenchymal volume and CSF spaces are normal. The midline structures are normal. Vascular: Normal flow voids. Skull and upper cervical spine: Normal calvarium and skull base. Visualized upper cervical spine and soft tissues are normal. Sinuses/Orbits:No paranasal sinus fluid levels or advanced mucosal thickening. No mastoid or middle ear effusion. Normal orbits. MRI CERVICAL SPINE FINDINGS Alignment: Grade 1 retrolisthesis at C5-6 Vertebrae: No acute abnormality. There is flowing anterior osteophytosis at C4-6. Cord: Normal signal and morphology. Posterior Fossa, vertebral arteries, paraspinal tissues: Negative. Disc levels: C1-2: Unremarkable. 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 Severe spinal canal stenosis  with compression of the spinal cord. No neural foraminal stenosis. C3-4: Ossification of the posterior longitudinal ligament. Severe spinal canal stenosis. There is compression of the spinal cord. Severe bilateral neural foraminal stenosis. C4-5: Ossification of posterior longitudinal ligament. Severe spinal canal stenosis. There is compression of the spinal cord. Severe bilateral neural foraminal stenosis. C5-6: Ossification of the posterior longitudinal ligament. Severe spinal canal stenosis. Spinal cord compression. Severe bilateral neural foraminal stenosis. C6-7: Small disc bulge. Moderate spinal canal stenosis. Severe left neural foraminal stenosis. C7-T1: Intermediate sized central disc protrusion. There is no spinal canal stenosis. No neural foraminal stenosis. IMPRESSION: 1. 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 No acute intracranial abnormality. 2. Severe spinal canal stenosis with  compression of the spinal cord at C2-3, C3-4, C4-5 and C5-6. 3. Severe bilateral neural foraminal stenosis at C3-4, C4-5 and C5-6. 4. Severe left C6-7 neural foraminal stenosis. Electronically Signed   By: Juanetta Nordmann M.D.   On: 08/04/2023 21:48   CT ANGIO HEAD NECK W WO CM Result Date: 08/04/2023 CLINICAL DATA:  Right-sided neck pain, pain radiating to back causing difficulty sleeping, balance difficulty. EXAM: CT ANGIOGRAPHY HEAD AND NECK WITH AND WITHOUT CONTRAST TECHNIQUE: Multidetector CT imaging of the head and neck was performed using the standard protocol during bolus administration of intravenous contrast. Multiplanar CT image reconstructions and MIPs were obtained to evaluate the vascular anatomy. Carotid stenosis measurements (when applicable) are obtained utilizing NASCET criteria, using the distal internal carotid diameter as the denominator. RADIATION DOSE REDUCTION: This exam was performed according to the departmental dose-optimization program which includes automated exposure control, adjustment of the mA and/or kV according to patient size and/or use of iterative reconstruction technique. CONTRAST:  75mL OMNIPAQUE  IOHEXOL  350 MG/ML SOLN COMPARISON:  None Available. FINDINGS: CT HEAD FINDINGS Brain: No acute intracranial hemorrhage. No CT evidence of acute infarct. No edema, mass effect, or midline shift. The basilar cisterns are patent. Ventricles: Ventricles are normal in size and configuration. Vascular: No hyperdense vessel. Intracranial atherosclerotic calcifications noted. Skull: No acute or aggressive finding. Sinuses/orbits: Left lens replacement. Orbits otherwise unremarkable. Mucous retention cyst in the partially visualized left maxillary sinus. Other: Mastoid air cells are clear. CTA NECK FINDINGS Aortic arch: Common origin of the brachiocephalic and left common carotid arteries. Imaged portion shows no evidence of aneurysm or dissection. No significant stenosis of the major arch  vessel origins. Pulmonary arteries: As permitted by contrast timing, there are no filling defects in the visualized pulmonary arteries. Subclavian arteries: Patent bilaterally. Mild atherosclerosis at the origin of the brachiocephalic artery and along the proximal right subclavian without significant stenosis. Additional mild atherosclerosis at the origin of the left subclavian artery without significant stenosis. Right carotid system: No evidence of dissection, stenosis (50% or greater), or occlusion. Minimal calcified atherosclerosis at the carotid bifurcation. Left carotid system: No evidence of dissection, stenosis (50% or greater), or occlusion. Minimal calcified atherosclerosis at the carotid bifurcation. Vertebral arteries: Codominant. Patent from the origins to the vertebrobasilar confluence. No evidence of occlusion or dissection. Atherosclerosis involving the left vertebral artery at the V3/V4 junction extending into the proximal V4 segment resulting in mild stenosis. Skeleton: No acute findings. Degenerative changes in the cervical spine. 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 There is significant spinal canal stenosis appreciated at C2-3, C3-4, C4-5, and C5-6. There is additional ossification of the posterior longitudinal ligament at C7-T1 with at least mild spinal canal stenosis. Facet arthrosis at multiple  levels in the cervical spine there is significant foraminal narrowing at multiple levels particularly at C3-4 and C4-5. Other neck: The visualized airway is patent. No cervical lymphadenopathy. Large right thyroid nodule measuring at least 3.3 cm in diameter. Upper chest: Visualized lung apices are clear. Review of the MIP images confirms the above findings CTA HEAD FINDINGS ANTERIOR CIRCULATION: The intracranial ICAs are patent bilaterally. Mild atherosclerosis of the bilateral carotid siphons. Mild stenosis of the bilateral  cavernous ICAs. Additional moderate stenosis of the left supraclinoid ICA. No proximal occlusion, aneurysm, or vascular malformation. MCAs: The middle cerebral arteries are patent bilaterally. ACAs: The anterior cerebral arteries are patent bilaterally. POSTERIOR CIRCULATION: No significant stenosis, proximal occlusion, aneurysm, or vascular malformation. PCAs: The posterior cerebral arteries are patent bilaterally. Pcomm: Not well visualized SCAs: The superior cerebellar arteries are patent bilaterally. Basilar artery: Patent AICAs: Patent PICAs: Patent Vertebral arteries: As above. Venous sinuses: As permitted by contrast timing, patent. Anatomic variants: None Review of the MIP images confirms the above findings IMPRESSION: No large vessel occlusion. No CT evidence of acute intracranial abnormality. No significant atherosclerosis of the arteries in the neck. 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 Atherosclerosis involving the left vertebral artery at the V3/V4 junction and proximal V4 segment resulting in mild stenosis. Significant degenerative changes of the cervical spine along with ossification of the posterior longitudinal ligament. Significant osseous spinal canal narrowing at multiple levels. Additional significant osseous foraminal narrowing. Recommend correlation with MRI cervical spine. Right thyroid nodule measuring up to 3.3 cm. Recommend nonemergent correlation with thyroid ultrasound if not previously performed. Electronically Signed   By: Denny Flack M.D.   On: 08/04/2023 17:58     Scheduled Meds:  amLODipine  5 mg Oral Daily   aspirin  EC  81 mg Oral Daily   gabapentin  100 mg Oral TID   insulin aspart  0-5 Units Subcutaneous QHS   insulin aspart  0-9 Units Subcutaneous TID WC   insulin glargine-yfgn  80 Units Subcutaneous QHS   lidocaine   1 patch Transdermal Q24H   [START ON 08/07/2023]  predniSONE  40 mg Oral Q breakfast   rosuvastatin  20 mg Oral Daily   Continuous Infusions:   LOS: 0 days    Time spent: 36 minutes   Jobe Mulder, DO [facility] 08/06/2023, 1:37 PM   Available via Epic secure chat 7am-7pm After these hours, please refer to coverage provider listed on [email or web address]

## 2023-08-06 NOTE — Progress Notes (Signed)
 Providing Compassionate, Quality Care - Together   Subjective: Patient reports neck pain that is more severe on the right.  Objective: Vital signs in last 24 hours: Temp:  [97.3 F (36.3 C)-98.5 F (36.9 C)] 98 F (36.7 C) (06/08 0418) Pulse Rate:  [54-78] 54 (06/08 0418) Resp:  [16-20] 18 (06/08 0418) BP: (118-138)/(42-71) 125/68 (06/08 0418) SpO2:  [95 %-99 %] 96 % (06/08 0418)  Intake/Output from previous day: 06/07 0701 - 06/08 0700 In: 285 [P.O.:285] Out: -  Intake/Output this shift: No intake/output data recorded.  Alert and oriented PERRLA Speech clear CN II-XII grossly intact MAE, Strength intact aside from R>L grip strength  Lab Results: Recent Labs    08/04/23 1629 08/06/23 0753  WBC 5.9 9.9  HGB 14.4 13.7  HCT 42.3 39.4  PLT 187 207   BMET Recent Labs    08/04/23 1629 08/06/23 0753  NA 145 139  K 3.9 3.7  CL 104 103  CO2 28 27  GLUCOSE 109* 127*  BUN 9 17  CREATININE 1.05* 1.00  CALCIUM 9.9 9.2    Studies/Results: ECHOCARDIOGRAM COMPLETE Result Date: 08/05/2023    ECHOCARDIOGRAM REPORT   Patient Name:   [facility] CHEEK Carriker Date of Exam: 08/05/2023 Medical Rec #:  [id number]            Height:       67.0 in Accession #:    [id number]           Weight:       219.0 lb Date of Birth:  March 26, 1950            BSA:          2.102 m Patient Age:    73 years             BP:           140/66 mmHg Patient Gender: F                    HR:           77 bpm. Exam Location:  Inpatient Procedure: 2D Echo, Color Doppler, Cardiac Doppler and Intracardiac            Opacification Agent (Both Spectral and Color Flow Doppler were            utilized during procedure). Indications:    Stroke  History:        Patient has no prior history of Echocardiogram examinations.                 Risk Factors:Hypertension, Diabetes, Dyslipidemia and Obesity.  Sonographer:    Travis Friedman RDCS Referring Phys: [id number] VASUNDHRA RATHORE IMPRESSIONS  1. Left ventricular ejection fraction,  by estimation, is 60 to 65%. The left ventricle has normal function. The left ventricle has no regional wall motion abnormalities. There is moderate left ventricular hypertrophy. Left ventricular diastolic parameters are consistent with Grade I diastolic dysfunction (impaired relaxation).  2. Right ventricular systolic function is normal. The right ventricular size is normal.  3. The mitral valve is abnormal. No evidence of mitral valve regurgitation. No evidence of mitral stenosis.  4. The aortic valve is tricuspid. There is moderate calcification of the aortic valve. There is moderate thickening of the aortic valve. Aortic valve regurgitation is not visualized. Aortic valve sclerosis is present, with no evidence of aortic valve stenosis.  5. The inferior vena cava is normal in size with greater than 50% respiratory variability, suggesting right  atrial pressure of 3 mmHg. FINDINGS  Left Ventricle: Left ventricular ejection fraction, by estimation, is 60 to 65%. The left ventricle has normal function. The left ventricle has no regional wall motion abnormalities. Strain was performed and the global longitudinal strain is indeterminate. The left ventricular internal cavity size was normal in size. There is moderate left ventricular hypertrophy. Left ventricular diastolic parameters are consistent with Grade I diastolic dysfunction (impaired relaxation). Right Ventricle: The right ventricular size is normal. No increase in right ventricular wall thickness. Right ventricular systolic function is normal. Left Atrium: Left atrial size was normal in size. Right Atrium: Right atrial size was normal in size. Pericardium: There is no evidence of pericardial effusion. Mitral Valve: The mitral valve is abnormal. There is mild thickening of the mitral valve leaflet(s). There is mild calcification of the mitral valve leaflet(s). No evidence of mitral valve regurgitation. No evidence of mitral valve stenosis. Tricuspid Valve: The  tricuspid valve is normal in structure. Tricuspid valve regurgitation is not demonstrated. No evidence of tricuspid stenosis. Aortic Valve: The aortic valve is tricuspid. There is moderate calcification of the aortic valve. There is moderate thickening of the aortic valve. Aortic valve regurgitation is not visualized. Aortic valve sclerosis is present, with no evidence of aortic valve stenosis. Aortic valve mean gradient measures 4.0 mmHg. Aortic valve peak gradient measures 7.1 mmHg. Aortic valve area, by VTI measures 2.89 cm. Pulmonic Valve: The pulmonic valve was normal in structure. Pulmonic valve regurgitation is trivial. No evidence of pulmonic stenosis. Aorta: The aortic root is normal in size and structure. Venous: The inferior vena cava is normal in size with greater than 50% respiratory variability, suggesting right atrial pressure of 3 mmHg. IAS/Shunts: No atrial level shunt detected by color flow Doppler. Additional Comments: 3D was performed not requiring image post processing on an independent workstation and was indeterminate.  LEFT VENTRICLE PLAX 2D LVIDd:         3.80 cm   Diastology LVIDs:         2.40 cm   LV e' medial:    6.31 cm/s LV PW:         1.20 cm   LV E/e' medial:  8.6 LV IVS:        1.30 cm   LV e' lateral:   6.96 cm/s LVOT diam:     2.00 cm   LV E/e' lateral: 7.8 LV SV:         79 LV SV Index:   38 LVOT Area:     3.14 cm  RIGHT VENTRICLE RV Basal diam:  2.90 cm RV S prime:     22.40 cm/s TAPSE (M-mode): 2.5 cm LEFT ATRIUM              Index        RIGHT ATRIUM           Index LA diam:        3.40 cm  1.62 cm/m   RA Area:     16.40 cm LA Vol (A2C):   100.0 ml 47.58 ml/m  RA Volume:   44.10 ml  20.98 ml/m LA Vol (A4C):   53.8 ml  25.60 ml/m LA Biplane Vol: 75.8 ml  36.06 ml/m  AORTIC VALVE AV Area (Vmax):    2.65 cm AV Area (Vmean):   2.66 cm AV Area (VTI):     2.89 cm AV Vmax:           133.00 cm/s AV Vmean:  87.300 cm/s AV VTI:            0.275 m AV Peak Grad:       7.1 mmHg AV Mean Grad:      4.0 mmHg LVOT Vmax:         112.00 cm/s LVOT Vmean:        73.900 cm/s LVOT VTI:          0.253 m LVOT/AV VTI ratio: 0.92  AORTA Ao Root diam: 3.10 cm Ao Asc diam:  3.70 cm MITRAL VALVE MV Area (PHT): 3.21 cm    SHUNTS MV Decel Time: 236 msec    Systemic VTI:  0.25 m MV E velocity: 54.50 cm/s  Systemic Diam: 2.00 cm MV A velocity: 74.90 cm/s MV E/A ratio:  0.73 Janelle Mediate MD Electronically signed by Janelle Mediate MD Signature Date/Time: 08/05/2023/3:28:13 PM    Final    MR BRAIN WO CONTRAST Result Date: 08/04/2023 CLINICAL DATA:  Acute neurologic deficit EXAM: MRI HEAD WITHOUT CONTRAST MRI CERVICAL SPINE WITHOUT CONTRAST TECHNIQUE: Multiplanar, multiecho pulse sequences of the brain and surrounding structures, and cervical spine, to include the craniocervical junction and cervicothoracic junction, were obtained without intravenous contrast. COMPARISON:  CTA head neck 08/04/2023 FINDINGS: MRI HEAD FINDINGS Brain: There is hyperintensity on diffusion-weighted imaging within the right centrum semiovale. No associated ADC correlate. Chronic microhemorrhage in the medial left temporal lobe. There is multifocal hyperintense T2-weighted signal within the white matter. Parenchymal volume and CSF spaces are normal. The midline structures are normal. Vascular: Normal flow voids. Skull and upper cervical spine: Normal calvarium and skull base. Visualized upper cervical spine and soft tissues are normal. Sinuses/Orbits:No paranasal sinus fluid levels or advanced mucosal thickening. No mastoid or middle ear effusion. Normal orbits. MRI CERVICAL SPINE FINDINGS Alignment: Grade 1 retrolisthesis at C5-6 Vertebrae: No acute abnormality. There is flowing anterior osteophytosis at C4-6. Cord: Normal signal and morphology. Posterior Fossa, vertebral arteries, paraspinal tissues: Negative. Disc levels: C1-2: Unremarkable. 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 Severe spinal canal  stenosis with compression of the spinal cord. No neural foraminal stenosis. C3-4: Ossification of the posterior longitudinal ligament. Severe spinal canal stenosis. There is compression of the spinal cord. Severe bilateral neural foraminal stenosis. C4-5: Ossification of posterior longitudinal ligament. Severe spinal canal stenosis. There is compression of the spinal cord. Severe bilateral neural foraminal stenosis. C5-6: Ossification of the posterior longitudinal ligament. Severe spinal canal stenosis. Spinal cord compression. Severe bilateral neural foraminal stenosis. C6-7: Small disc bulge. Moderate spinal canal stenosis. Severe left neural foraminal stenosis. C7-T1: Intermediate sized central disc protrusion. There is no spinal canal stenosis. No neural foraminal stenosis. IMPRESSION: 1. 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 No acute intracranial abnormality. 2. Severe spinal canal stenosis with compression of the spinal cord at C2-3, C3-4, C4-5 and C5-6. 3. Severe bilateral neural foraminal stenosis at C3-4, C4-5 and C5-6. 4. Severe left C6-7 neural foraminal stenosis. Electronically Signed   By: Juanetta Nordmann M.D.   On: 08/04/2023 21:48   MR Cervical Spine Wo Contrast Result Date: 08/04/2023 CLINICAL DATA:  Acute neurologic deficit EXAM: MRI HEAD WITHOUT CONTRAST MRI CERVICAL SPINE WITHOUT CONTRAST TECHNIQUE: Multiplanar, multiecho pulse sequences of the brain and surrounding structures, and cervical spine, to include the craniocervical junction and cervicothoracic junction, were obtained without intravenous contrast. COMPARISON:  CTA head neck 08/04/2023 FINDINGS: MRI HEAD FINDINGS Brain: There is hyperintensity on diffusion-weighted imaging within the right centrum semiovale. No associated ADC correlate. Chronic microhemorrhage in the medial left  temporal lobe. There is multifocal hyperintense T2-weighted signal within the white matter. Parenchymal volume and CSF spaces are normal. The  midline structures are normal. Vascular: Normal flow voids. Skull and upper cervical spine: Normal calvarium and skull base. Visualized upper cervical spine and soft tissues are normal. Sinuses/Orbits:No paranasal sinus fluid levels or advanced mucosal thickening. No mastoid or middle ear effusion. Normal orbits. MRI CERVICAL SPINE FINDINGS Alignment: Grade 1 retrolisthesis at C5-6 Vertebrae: No acute abnormality. There is flowing anterior osteophytosis at C4-6. Cord: Normal signal and morphology. Posterior Fossa, vertebral arteries, paraspinal tissues: Negative. Disc levels: C1-2: Unremarkable. 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 Severe spinal canal stenosis with compression of the spinal cord. No neural foraminal stenosis. C3-4: Ossification of the posterior longitudinal ligament. Severe spinal canal stenosis. There is compression of the spinal cord. Severe bilateral neural foraminal stenosis. C4-5: Ossification of posterior longitudinal ligament. Severe spinal canal stenosis. There is compression of the spinal cord. Severe bilateral neural foraminal stenosis. C5-6: Ossification of the posterior longitudinal ligament. Severe spinal canal stenosis. Spinal cord compression. Severe bilateral neural foraminal stenosis. C6-7: Small disc bulge. Moderate spinal canal stenosis. Severe left neural foraminal stenosis. C7-T1: Intermediate sized central disc protrusion. There is no spinal canal stenosis. No neural foraminal stenosis. IMPRESSION: 1. 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 No acute intracranial abnormality. 2. Severe spinal canal stenosis with compression of the spinal cord at C2-3, C3-4, C4-5 and C5-6. 3. Severe bilateral neural foraminal stenosis at C3-4, C4-5 and C5-6. 4. Severe left C6-7 neural foraminal stenosis. Electronically Signed   By: Juanetta Nordmann M.D.   On: 08/04/2023 21:48   CT ANGIO HEAD NECK W WO CM Result Date: 08/04/2023 CLINICAL DATA:  Right-sided neck  pain, pain radiating to back causing difficulty sleeping, balance difficulty. EXAM: CT ANGIOGRAPHY HEAD AND NECK WITH AND WITHOUT CONTRAST TECHNIQUE: Multidetector CT imaging of the head and neck was performed using the standard protocol during bolus administration of intravenous contrast. Multiplanar CT image reconstructions and MIPs were obtained to evaluate the vascular anatomy. Carotid stenosis measurements (when applicable) are obtained utilizing NASCET criteria, using the distal internal carotid diameter as the denominator. RADIATION DOSE REDUCTION: This exam was performed according to the departmental dose-optimization program which includes automated exposure control, adjustment of the mA and/or kV according to patient size and/or use of iterative reconstruction technique. CONTRAST:  75mL OMNIPAQUE  IOHEXOL  350 MG/ML SOLN COMPARISON:  None Available. FINDINGS: CT HEAD FINDINGS Brain: No acute intracranial hemorrhage. No CT evidence of acute infarct. No edema, mass effect, or midline shift. The basilar cisterns are patent. Ventricles: Ventricles are normal in size and configuration. Vascular: No hyperdense vessel. Intracranial atherosclerotic calcifications noted. Skull: No acute or aggressive finding. Sinuses/orbits: Left lens replacement. Orbits otherwise unremarkable. Mucous retention cyst in the partially visualized left maxillary sinus. Other: Mastoid air cells are clear. CTA NECK FINDINGS Aortic arch: Common origin of the brachiocephalic and left common carotid arteries. Imaged portion shows no evidence of aneurysm or dissection. No significant stenosis of the major arch vessel origins. Pulmonary arteries: As permitted by contrast timing, there are no filling defects in the visualized pulmonary arteries. Subclavian arteries: Patent bilaterally. Mild atherosclerosis at the origin of the brachiocephalic artery and along the proximal right subclavian without significant stenosis. Additional mild  atherosclerosis at the origin of the left subclavian artery without significant stenosis. Right carotid system: No evidence of dissection, stenosis (50% or greater), or occlusion. Minimal calcified atherosclerosis at the carotid bifurcation. Left carotid system: No evidence of  dissection, stenosis (50% or greater), or occlusion. Minimal calcified atherosclerosis at the carotid bifurcation. Vertebral arteries: Codominant. Patent from the origins to the vertebrobasilar confluence. No evidence of occlusion or dissection. Atherosclerosis involving the left vertebral artery at the V3/V4 junction extending into the proximal V4 segment resulting in mild stenosis. Skeleton: No acute findings. Degenerative changes in the cervical spine. 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 There is significant spinal canal stenosis appreciated at C2-3, C3-4, C4-5, and C5-6. There is additional ossification of the posterior longitudinal ligament at C7-T1 with at least mild spinal canal stenosis. Facet arthrosis at multiple levels in the cervical spine there is significant foraminal narrowing at multiple levels particularly at C3-4 and C4-5. Other neck: The visualized airway is patent. No cervical lymphadenopathy. Large right thyroid nodule measuring at least 3.3 cm in diameter. Upper chest: Visualized lung apices are clear. Review of the MIP images confirms the above findings CTA HEAD FINDINGS ANTERIOR CIRCULATION: The intracranial ICAs are patent bilaterally. Mild atherosclerosis of the bilateral carotid siphons. Mild stenosis of the bilateral cavernous ICAs. Additional moderate stenosis of the left supraclinoid ICA. No proximal occlusion, aneurysm, or vascular malformation. MCAs: The middle cerebral arteries are patent bilaterally. ACAs: The anterior cerebral arteries are patent bilaterally. POSTERIOR CIRCULATION: No significant stenosis, proximal occlusion,  aneurysm, or vascular malformation. PCAs: The posterior cerebral arteries are patent bilaterally. Pcomm: Not well visualized SCAs: The superior cerebellar arteries are patent bilaterally. Basilar artery: Patent AICAs: Patent PICAs: Patent Vertebral arteries: As above. Venous sinuses: As permitted by contrast timing, patent. Anatomic variants: None Review of the MIP images confirms the above findings IMPRESSION: No large vessel occlusion. No CT evidence of acute intracranial abnormality. No significant atherosclerosis of the arteries in the neck. 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 Atherosclerosis involving the left vertebral artery at the V3/V4 junction and proximal V4 segment resulting in mild stenosis. Significant degenerative changes of the cervical spine along with ossification of the posterior longitudinal ligament. Significant osseous spinal canal narrowing at multiple levels. Additional significant osseous foraminal narrowing. Recommend correlation with MRI cervical spine. Right thyroid nodule measuring up to 3.3 cm. Recommend nonemergent correlation with thyroid ultrasound if not previously performed. Electronically Signed   By: Denny Flack M.D.   On: 08/04/2023 17:58    Assessment/Plan: Patient with ossification of the posterior longitudinal ligament, cervical spondylolisthesis, cervical spondylosis, cervical stenosis, and cervical myelopathy. The patient's still in quite a bit of pain and only has IV morphine  ordered as the oxycodone  order has expired. I would prefer the patient get the PO pain medication over the IV medication in anticipation of discharging home. I have also added gabapentin in hopes of improving the patient's nerve pain. Dr. Larrie Po discussed outpatient follow up in order to move forward with scheduling posterior cervical decompression and fusion surgery. Neurosurgery will sign  off for now. Please reconsult if we can be of further assistance.   LOS: 0 days    I am in communication with my attending and they agree with the plan for this patient.   Henreitta Locus, DNP, AGNP-C Nurse Practitioner  [facility] [address], [place], [place] [address] P: [phone]    F: [phone]  08/06/2023, 10:41 AM

## 2023-08-07 DIAGNOSIS — I63311 Cerebral infarction due to thrombosis of right middle cerebral artery: Secondary | ICD-10-CM | POA: Diagnosis not present

## 2023-08-07 LAB — BASIC METABOLIC PANEL WITH GFR
Anion gap: 9 (ref 5–15)
BUN: 17 mg/dL (ref 8–23)
CO2: 26 mmol/L (ref 22–32)
Calcium: 9.3 mg/dL (ref 8.9–10.3)
Chloride: 107 mmol/L (ref 98–111)
Creatinine, Ser: 1.1 mg/dL — ABNORMAL HIGH (ref 0.44–1.00)
GFR, Estimated: 54 mL/min — ABNORMAL LOW (ref >60)
Glucose, Bld: 116 mg/dL — ABNORMAL HIGH (ref 70–99)
Potassium: 4.1 mmol/L (ref 3.5–5.1)
Sodium: 142 mmol/L (ref 135–145)

## 2023-08-07 LAB — CBC
HCT: 39.8 % (ref 36.0–46.0)
Hemoglobin: 13.5 g/dL (ref 12.0–15.0)
MCH: 27.8 pg (ref 26.0–34.0)
MCHC: 33.9 g/dL (ref 30.0–36.0)
MCV: 81.9 fL (ref 80.0–100.0)
Platelets: 202 10*3/uL (ref 150–400)
RBC: 4.86 MIL/uL (ref 3.87–5.11)
RDW: 14 % (ref 11.5–15.5)
WBC: 7.2 10*3/uL (ref 4.0–10.5)
nRBC: 0 % (ref 0.0–0.2)

## 2023-08-07 LAB — GLUCOSE, CAPILLARY
Glucose-Capillary: 116 mg/dL — ABNORMAL HIGH (ref 70–99)
Glucose-Capillary: 114 mg/dL — ABNORMAL HIGH (ref 70–99)
Glucose-Capillary: 189 mg/dL — ABNORMAL HIGH (ref 70–99)
Glucose-Capillary: 208 mg/dL — ABNORMAL HIGH (ref 70–99)
Glucose-Capillary: 206 mg/dL — ABNORMAL HIGH (ref 70–99)

## 2023-08-07 LAB — HEMOGLOBIN A1C
Hgb A1c MFr Bld: 7 % — ABNORMAL HIGH (ref 4.8–5.6)
Mean Plasma Glucose: 154 mg/dL

## 2023-08-07 MED ORDER — BISACODYL 10 MG RE SUPP
10.0000 mg | Freq: Once | RECTAL | Status: AC
  Administered 2023-08-07: 10 mg via RECTAL
  Filled 2023-08-07: qty 1

## 2023-08-07 MED ORDER — SENNOSIDES-DOCUSATE SODIUM 8.6-50 MG PO TABS
1.0000 | ORAL_TABLET | Freq: Every day | ORAL | Status: DC
  Administered 2023-08-07 – 2023-08-08 (×2): 1 via ORAL
  Filled 2023-08-07 (×2): qty 1

## 2023-08-07 MED ORDER — OXYCODONE-ACETAMINOPHEN 5-325 MG PO TABS
1.0000 | ORAL_TABLET | ORAL | Status: DC | PRN
  Administered 2023-08-07 – 2023-08-08 (×4): 1 via ORAL
  Filled 2023-08-07 (×4): qty 1

## 2023-08-07 NOTE — Progress Notes (Signed)
 PROGRESS NOTE  Carla Stokes WUJ:[id number] DOB: 1950-04-01 DOA: 08/04/2023 PCP: Madlyn Schirmer, MD   LOS: 0 days   Brief narrative:  Carla Stokes is a 73 y.o. female with medical history significant of hypertension, hyperlipidemia, insulin-dependent type 2 diabetes, obesity presented hospital with neck pain on the right side for a month which was progressive in nature causing her unable to sleep at home.  Patient had gone to see her primary care provider and had been prescribed medications and patches which did not help.  X-ray of her neck was done which showed arthritis.  Patient did not improve so was brought into the hospital.  In the hospital brain MRI and C-spine showed a subacute infarct in the right centrum semiovale.  She has severe bilateral neuroforaminal stenosis and severe spinal canal stenosis with compression of the spinal cord at C2-C6.  Patient was then admitted hospital for further evaluation and treatment.   Assessment/Plan: Principal Problem:   CVA (cerebral vascular accident) [facility]) Active Problems:   Cervical stenosis of spinal canal   Thyroid nodule   Essential hypertension   Hyperlipidemia   Type 2 diabetes mellitus (HCC)    CVA (cerebral vascular accident)  Seen by neurology during hospitalization.  Continue aspirin .  LDL was 49 Crestor 20 daily.  2D echocardiogram showed mild diastolic dysfunction grade 1.  Neurology has signed off at this time.  Advised lifestyle modifications.    Cervical stenosis of spinal canal Currently on 5-day prednisone burst 40 mg daily.  Continue Percocet gabapentin.  Patient still complains of severe pain in the neck with some sharp component to it.  Does not feel like she has improved much.  Patient was seen by neurosurgery during hospitalization and recommended outpatient neurosurgical follow-up for surgical management..  Patient has been seen by physical therapy and recommend home health PT on discharge.  Will continue to  optimize medications for pain if possible.                        Thyroid nodule Recommend outpatient PCP follow-up.    Essential hypertension Continue Norvasc's daily     Hyperlipidemia Continue Crestor     Type 2 diabetes mellitus Currently on sliding scale insulin and Semglee.  Closely monitor blood glucose levels while on steroids.  Latest POC glucose of 189  DVT prophylaxis: SCD's Start: 08/05/23 9562   Disposition: Likely home with home health 08/08/23 if pain is better controlled  Status is: Observation \ The patient will require care spanning > 2 midnights and should be moved to inpatient because: Uncontrolled pain, pain medication adjustment    Code Status:     Code Status: Full Code  Family Communication: None at bedside  Consultants: Neurosurgery Neurology  Procedures: None  Anti-infectives:  None  Anti-infectives (From admission, onward)    None      Subjective: Today, patient was seen and examined at bedside.  Patient still complains of severe pain in the neck and has not had much relief from pain.  Complains of severe sharp pain.  Denies any nausea, vomiting shortness of breath or dyspnea.  Objective: Vitals:   08/07/23 0831 08/07/23 1204  BP: (!) 154/77 (!) 154/61  Pulse: 63 (!) 53  Resp: 20 16  Temp: 97.8 F (36.6 C) 98.2 F (36.8 C)  SpO2: 96% 99%    Intake/Output Summary (Last 24 hours) at 08/07/2023 1251 Last data filed at 08/07/2023 0930 Gross per 24 hour  Intake 960  ml  Output --  Net 960 ml   Filed Weights   08/04/23 1313  Weight: 99.3 kg   Body mass index is 34.3 kg/m.   Physical Exam:  GENERAL: Patient is alert awake and oriented.   Obese build, HENT: No scleral pallor or icterus. Pupils equally reactive to light. Oral mucosa is moist,  NECK: is supple, no gross swelling noted.restricted movement of the neck CHEST: Clear to auscultation.  Diminished breath sounds bilaterally. CVS: S1 and S2 heard, no murmur. Regular  rate and rhythm.  ABDOMEN: Soft, non-tender, bowel sounds are present. EXTREMITIES: No edema. CNS: Cranial nerves are intact. No focal motor deficits. SKIN: warm and dry without rashes.  Data Review: I have personally reviewed the following laboratory data and studies,  CBC: Recent Labs  Lab 08/04/23 1627 08/04/23 1629 08/06/23 0753 08/07/23 0450  WBC  --  5.9 9.9 7.2  NEUTROABS  --  3.8  --   --   HGB 14.3 14.4 13.7 13.5  HCT 42.0 42.3 39.4 39.8  MCV  --  82.3 78.5* 81.9  PLT  --  187 207 202   Basic Metabolic Panel: Recent Labs  Lab 08/04/23 1627 08/04/23 1629 08/06/23 0753 08/07/23 0450  NA 144 145 139 142  K 3.8 3.9 3.7 4.1  CL 103 104 103 107  CO2  --  28 27 26   GLUCOSE 106* 109* 127* 116*  BUN 12 9 17 17   CREATININE 1.10* 1.05* 1.00 1.10*  CALCIUM  --  9.9 9.2 9.3   Liver Function Tests: Recent Labs  Lab 08/04/23 1629  AST 34  ALT 37  ALKPHOS 54  BILITOT 0.5  PROT 7.1  ALBUMIN 4.2   No results for input(s): "LIPASE", "AMYLASE" in the last 168 hours. No results for input(s): "AMMONIA" in the last 168 hours. Cardiac Enzymes: No results for input(s): "CKTOTAL", "CKMB", "CKMBINDEX", "TROPONINI" in the last 168 hours. BNP (last 3 results) No results for input(s): "BNP" in the last 8760 hours.  ProBNP (last 3 results) No results for input(s): "PROBNP" in the last 8760 hours.  CBG: Recent Labs  Lab 08/06/23 1621 08/06/23 2109 08/07/23 0609 08/07/23 0833 08/07/23 1206  GLUCAP 164* 149* 116* 114* 189*   Recent Results (from the past 240 hours)  MRSA Next Gen by PCR, Nasal     Status: None   Collection Time: 08/05/23  8:20 AM   Specimen: Nasal Mucosa; Nasal Swab  Result Value Ref Range Status   MRSA by PCR Next Gen NOT DETECTED NOT DETECTED Final    Comment: (NOTE) The GeneXpert MRSA Assay (FDA approved for NASAL specimens only), is one component of a comprehensive MRSA colonization surveillance program. It is not intended to diagnose MRSA  infection nor to guide or monitor treatment for MRSA infections. Test performance is not FDA approved in patients less than 16 years old. Performed at [facility], [address], [place], [place] [address]      Studies: ECHOCARDIOGRAM COMPLETE Result Date: 08/05/2023    ECHOCARDIOGRAM REPORT   Patient Name:   [facility] Console Date of Exam: 08/05/2023 Medical Rec #:  [id number]            Height:       67.0 in Accession #:    [id number]           Weight:       219.0 lb Date of Birth:  10-22-1950  BSA:          2.102 m Patient Age:    72 years             BP:           140/66 mmHg Patient Gender: F                    HR:           77 bpm. Exam Location:  Inpatient Procedure: 2D Echo, Color Doppler, Cardiac Doppler and Intracardiac            Opacification Agent (Both Spectral and Color Flow Doppler were            utilized during procedure). Indications:    Stroke  History:        Patient has no prior history of Echocardiogram examinations.                 Risk Factors:Hypertension, Diabetes, Dyslipidemia and Obesity.  Sonographer:    Travis Friedman RDCS Referring Phys: [id number] VASUNDHRA RATHORE IMPRESSIONS  1. Left ventricular ejection fraction, by estimation, is 60 to 65%. The left ventricle has normal function. The left ventricle has no regional wall motion abnormalities. There is moderate left ventricular hypertrophy. Left ventricular diastolic parameters are consistent with Grade I diastolic dysfunction (impaired relaxation).  2. Right ventricular systolic function is normal. The right ventricular size is normal.  3. The mitral valve is abnormal. No evidence of mitral valve regurgitation. No evidence of mitral stenosis.  4. The aortic valve is tricuspid. There is moderate calcification of the aortic valve. There is moderate thickening of the aortic valve. Aortic valve regurgitation is not visualized. Aortic valve sclerosis is present, with no evidence of aortic valve stenosis.  5. The  inferior vena cava is normal in size with greater than 50% respiratory variability, suggesting right atrial pressure of 3 mmHg. FINDINGS  Left Ventricle: Left ventricular ejection fraction, by estimation, is 60 to 65%. The left ventricle has normal function. The left ventricle has no regional wall motion abnormalities. Strain was performed and the global longitudinal strain is indeterminate. The left ventricular internal cavity size was normal in size. There is moderate left ventricular hypertrophy. Left ventricular diastolic parameters are consistent with Grade I diastolic dysfunction (impaired relaxation). Right Ventricle: The right ventricular size is normal. No increase in right ventricular wall thickness. Right ventricular systolic function is normal. Left Atrium: Left atrial size was normal in size. Right Atrium: Right atrial size was normal in size. Pericardium: There is no evidence of pericardial effusion. Mitral Valve: The mitral valve is abnormal. There is mild thickening of the mitral valve leaflet(s). There is mild calcification of the mitral valve leaflet(s). No evidence of mitral valve regurgitation. No evidence of mitral valve stenosis. Tricuspid Valve: The tricuspid valve is normal in structure. Tricuspid valve regurgitation is not demonstrated. No evidence of tricuspid stenosis. Aortic Valve: The aortic valve is tricuspid. There is moderate calcification of the aortic valve. There is moderate thickening of the aortic valve. Aortic valve regurgitation is not visualized. Aortic valve sclerosis is present, with no evidence of aortic valve stenosis. Aortic valve mean gradient measures 4.0 mmHg. Aortic valve peak gradient measures 7.1 mmHg. Aortic valve area, by VTI measures 2.89 cm. Pulmonic Valve: The pulmonic valve was normal in structure. Pulmonic valve regurgitation is trivial. No evidence of pulmonic stenosis. Aorta: The aortic root is normal in size and structure. Venous: The inferior vena cava  is normal in  size with greater than 50% respiratory variability, suggesting right atrial pressure of 3 mmHg. IAS/Shunts: No atrial level shunt detected by color flow Doppler. Additional Comments: 3D was performed not requiring image post processing on an independent workstation and was indeterminate.  LEFT VENTRICLE PLAX 2D LVIDd:         3.80 cm   Diastology LVIDs:         2.40 cm   LV e' medial:    6.31 cm/s LV PW:         1.20 cm   LV E/e' medial:  8.6 LV IVS:        1.30 cm   LV e' lateral:   6.96 cm/s LVOT diam:     2.00 cm   LV E/e' lateral: 7.8 LV SV:         79 LV SV Index:   38 LVOT Area:     3.14 cm  RIGHT VENTRICLE RV Basal diam:  2.90 cm RV S prime:     22.40 cm/s TAPSE (M-mode): 2.5 cm LEFT ATRIUM              Index        RIGHT ATRIUM           Index LA diam:        3.40 cm  1.62 cm/m   RA Area:     16.40 cm LA Vol (A2C):   100.0 ml 47.58 ml/m  RA Volume:   44.10 ml  20.98 ml/m LA Vol (A4C):   53.8 ml  25.60 ml/m LA Biplane Vol: 75.8 ml  36.06 ml/m  AORTIC VALVE AV Area (Vmax):    2.65 cm AV Area (Vmean):   2.66 cm AV Area (VTI):     2.89 cm AV Vmax:           133.00 cm/s AV Vmean:          87.300 cm/s AV VTI:            0.275 m AV Peak Grad:      7.1 mmHg AV Mean Grad:      4.0 mmHg LVOT Vmax:         112.00 cm/s LVOT Vmean:        73.900 cm/s LVOT VTI:          0.253 m LVOT/AV VTI ratio: 0.92  AORTA Ao Root diam: 3.10 cm Ao Asc diam:  3.70 cm MITRAL VALVE MV Area (PHT): 3.21 cm    SHUNTS MV Decel Time: 236 msec    Systemic VTI:  0.25 m MV E velocity: 54.50 cm/s  Systemic Diam: 2.00 cm MV A velocity: 74.90 cm/s MV E/A ratio:  0.73 Janelle Mediate MD Electronically signed by Janelle Mediate MD Signature Date/Time: 08/05/2023/3:28:13 PM    Final       Rosena Conradi, MD  [facility] 08/07/2023  If 7PM-7AM, please contact night-coverage

## 2023-08-07 NOTE — TOC Initial Note (Signed)
 Transition of Care [facility]) - Initial/Assessment Note    Patient Details  Name: Carla Stokes MRN: [id number] Date of Birth: 02-11-1951  [name] CM/SW Contact:    Jonathan Neighbor, RN Phone Number: 08/07/2023, 11:40 AM  Clinical Narrative:                  Pt is from home alone. Her son lives across the street and can assist if needed.  Pt  manages her own medications and denies any issues.  She drives but son/ DIL can assist.  Home health arranged with [facility]. Information on the AVS. [facility] will contact her for the first home visit. Walker ordered through [employer] and will be delivered to the room.  Pts son will provide transport home at d/c.   Expected Discharge Plan: Home w Home Health Services Barriers to Discharge: Continued Medical Work up   Patient Goals and CMS Choice   CMS Medicare.gov Compare Post Acute Care list provided to:: Patient Choice offered to / list presented to : Patient      Expected Discharge Plan and Services   Discharge Planning Services: CM Consult Post Acute Care Choice: Home Health, Durable Medical Equipment Living arrangements for the past 2 months: Single Family Home                 DME Arranged: Walker rolling DME Agency: [employer] Date DME Agency Contacted: 08/07/23   Representative spoke with at DME Agency: Coral Der HH Arranged: PT HH Agency: [facility] Date [facility] Agency Contacted: 08/07/23   Representative spoke with at [facility] Agency: kelly  Prior Living Arrangements/Services Living arrangements for the past 2 months: Single Family Home Lives with:: Self Patient language and need for interpreter reviewed:: Yes Do you feel safe going back to the place where you live?: Yes        Care giver support system in place?: Yes (comment) Current home services: DME (shower seat) Criminal Activity/Legal Involvement Pertinent to Current Situation/Hospitalization: No - Comment as needed  Activities of Daily  Living      Permission Sought/Granted                  Emotional Assessment Appearance:: Appears stated age Attitude/Demeanor/Rapport: Engaged Affect (typically observed): Accepting Orientation: : Oriented to Place, Oriented to  Time, Oriented to Self, Oriented to Situation   Psych Involvement: No (comment)  Admission diagnosis:  CVA (cerebral vascular accident) (HCC) [I63.9] Cervical stenosis of spinal canal [M48.02] Cerebrovascular accident (CVA), unspecified mechanism (HCC) [I63.9] Patient Active Problem List   Diagnosis Date Noted   CVA (cerebral vascular accident) (HCC) 08/05/2023   Cervical stenosis of spinal canal 08/05/2023   Thyroid nodule 08/05/2023   Essential hypertension 08/05/2023   Hyperlipidemia 08/05/2023   Type 2 diabetes mellitus (HCC) 08/05/2023   Bleeding internal hemorrhoids 04/13/2022   Subacute osteomyelitis of right foot (HCC) 03/16/2022   Diabetic foot ulcer (HCC) 01/18/2022   PAD (peripheral artery disease) (HCC) 01/18/2022   PCP:  Madlyn Schirmer, MD Pharmacy:   [facility] [phone] [address], [place] - [address] AT [facility] [address] [place] [address] Phone: [phone] Fax: [phone]     Social Drivers of Health (SDOH) Social History: SDOH Screenings   Food Insecurity: No Food Insecurity (08/06/2023)  Housing: Low Risk  (08/06/2023)  Transportation Needs: No Transportation Needs (08/06/2023)  Utilities: Not At Risk (08/06/2023)  Depression (PHQ2-9): Low Risk  (05/18/2022)  Social Connections: Moderately Integrated (08/06/2023)  Tobacco  Use: Low Risk  (08/04/2023)   SDOH Interventions:     Readmission Risk Interventions     No data to display

## 2023-08-07 NOTE — Progress Notes (Signed)
 Physical Therapy Treatment Patient Details Name: Carla Stokes MRN: [id number] DOB: Feb 04, 1951 Today's Date: 08/07/2023   History of Present Illness Pt is a 73 yo female [facility] on [facility]/04[facility] with c/o neck pain and impaired balance. 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 PMH of HTN, DM II, obesity, DDD.    PT Comments  Patient progressing with ambulation and balance and LE strength.  Notes some cervical pain and with continued cognitive issues with memory (self reported) and needing step by step instructions for mobility at times.  Remains appropriate for HHPT with family support at d/c.    If plan is discharge home, recommend the following: A little help with walking and/or transfers;A little help with bathing/dressing/bathroom;Assistance with cooking/housework;Direct supervision/assist for medications management;Direct supervision/assist for financial management;Assist for transportation   Can travel by private vehicle        Equipment Recommendations  Rolling walker (2 wheels)    Recommendations for Other Services       Precautions / Restrictions Precautions Precautions: Fall Recall of Precautions/Restrictions: Intact     Mobility  Bed Mobility Overal bed mobility: Needs Assistance Bed Mobility: Rolling, Sidelying to Sit, Sit to Sidelying Rolling: Supervision, Used rails Sidelying to sit: Contact guard assist     Sit to sidelying: Contact guard assist General bed mobility comments: increased time and cues for technique though pt reports they had instructed her in technique previously    Transfers Overall transfer level: Needs assistance Equipment used: Rolling walker (2 wheels) Transfers: Sit to/from Stand Sit to Stand: From elevated surface, Supervision, Contact guard assist           General transfer comment: initially with CGA progressing to S with practice    Ambulation/Gait Ambulation/Gait assistance: Contact guard  assist, Supervision Gait Distance (Feet): 180 Feet Assistive device: Rolling walker (2 wheels) Gait Pattern/deviations: Step-through pattern, Decreased stride length       General Gait Details: initially CGA for safety; progressing to S, though increased time and cues with CGA for turning   Stairs             Wheelchair Mobility     Tilt Bed    Modified Rankin (Stroke Patients Only) Modified Rankin (Stroke Patients Only) Pre-Morbid Rankin Score: No symptoms Modified Rankin: Moderately severe disability     Balance Overall balance assessment: Needs assistance Sitting-balance support: No upper extremity supported, Feet supported Sitting balance-Leahy Scale: Good Sitting balance - Comments: completing toilet hygiene unaided   Standing balance support: Bilateral upper extremity supported, During functional activity Standing balance-Leahy Scale: Poor Standing balance comment: UE support for balance                            Communication Communication Communication: No apparent difficulties  Cognition Arousal: Alert Behavior During Therapy: WFL for tasks assessed/performed   PT - Cognitive impairments: Attention, Safety/Judgement, Problem solving, Memory                       PT - Cognition Comments: step by step commands for mobility at times, admits to poor memory, reminding herself throughout to stay close to walker Following commands: Impaired Following commands impaired: Follows multi-step commands with increased time    Cueing Cueing Techniques: Verbal cues, Visual cues, Tactile cues  Exercises Other Exercises Other Exercises: sit<>stand x 3 with S Other Exercises: supine ankle DF/PF x 10 Other Exercises: supine bridge x 5  General Comments General comments (skin integrity, edema, etc.): sister in the room and supportive, pt relates her grandaughters now out of school and her daughter in law works from home and they live next door;  sister/friend discussing helping pt at home      Pertinent Vitals/Pain Pain Assessment Faces Pain Scale: Hurts little more Pain Location: back of neck Pain Descriptors / Indicators: Discomfort, Aching Pain Intervention(s): Monitored during session, Repositioned    Home Living                          Prior Function            PT Goals (current goals can now be found in the care plan section) Progress towards PT goals: Progressing toward goals    Frequency    Min 2X/week      PT Plan      Co-evaluation              AM-PAC PT "6 Clicks" Mobility   Outcome Measure  Help needed turning from your back to your side while in a flat bed without using bedrails?: A Little Help needed moving from lying on your back to sitting on the side of a flat bed without using bedrails?: A Little Help needed moving to and from a bed to a chair (including a wheelchair)?: A Little Help needed standing up from a chair using your arms (e.g., wheelchair or bedside chair)?: A Little Help needed to walk in hospital room?: A Little Help needed climbing 3-5 steps with a railing? : A Little 6 Click Score: 18    End of Session Equipment Utilized During Treatment: Gait belt Activity Tolerance: Patient tolerated treatment well Patient left: in bed;with call bell/phone within reach;with bed alarm set;with family/visitor present   PT Visit Diagnosis: Other abnormalities of gait and mobility (R26.89);Other symptoms and signs involving the nervous system (R29.898)     Time: 5732-2025 PT Time Calculation (min) (ACUTE ONLY): 28 min  Charges:    $Gait Training: 8-22 mins $Therapeutic Activity: 8-22 mins PT General Charges $$ ACUTE PT VISIT: 1 Visit                     Abigail Hoff, PT Acute Rehabilitation Services Office:[phone] 08/07/2023    Carla Stokes 08/07/2023, 6:21 PM

## 2023-08-07 NOTE — Hospital Course (Signed)
 Carla Stokes is a 73 y.o. female with medical history significant of hypertension, hyperlipidemia, insulin-dependent type 2 diabetes, obesity presented hospital with neck pain on the right side for a month which was progressive in nature causing her unable to sleep at home.  Patient had gone to see her primary care provider and had been prescribed medications and patches which did not help.  X-ray of her neck was done which showed arthritis.  Patient did not improve so was brought into the hospital.  In the hospital brain MRI and C-spine showed a subacute infarct in the right centrum semiovale.  She has severe bilateral neuroforaminal stenosis and severe spinal canal stenosis with compression of the spinal cord at C2-C6.  Patient was then admitted hospital for further evaluation and treatment.   Assessment & Plan:      CVA (cerebral vascular accident)  Seen by neurology during hospitalization.  Continue aspirin .  LDL was 49 Crestor 20 daily.  2D echocardiogram showed mild diastolic dysfunction grade 1.  Neurology has signed off at this time.    Cervical stenosis of spinal canal Currently on 5-day prednisone burst 40 mg daily.  Continue Percocet gabapentin.  Patient was seen by neurosurgery during hospitalization and recommended outpatient neurosurgical follow-up for surgical management..  Patient has been seen by physical therapy and recommend home health PT on discharge.                        Thyroid nodule Recommend outpatient PCP follow-up.    Essential hypertension Continue Norvasc's daily     Hyperlipidemia Continue Crestor     Type 2 diabetes mellitus Currently on sliding scale insulin and Semglee.

## 2023-08-07 NOTE — Progress Notes (Signed)
 Heat packs applied to the patients posterior neck as needed; continue to manage pain.

## 2023-08-08 DIAGNOSIS — M4802 Spinal stenosis, cervical region: Secondary | ICD-10-CM | POA: Diagnosis present

## 2023-08-08 DIAGNOSIS — E1165 Type 2 diabetes mellitus with hyperglycemia: Secondary | ICD-10-CM | POA: Diagnosis present

## 2023-08-08 DIAGNOSIS — I6381 Other cerebral infarction due to occlusion or stenosis of small artery: Secondary | ICD-10-CM | POA: Diagnosis present

## 2023-08-08 DIAGNOSIS — R531 Weakness: Secondary | ICD-10-CM | POA: Diagnosis present

## 2023-08-08 DIAGNOSIS — Z7984 Long term (current) use of oral hypoglycemic drugs: Secondary | ICD-10-CM | POA: Diagnosis not present

## 2023-08-08 DIAGNOSIS — M488X2 Other specified spondylopathies, cervical region: Secondary | ICD-10-CM | POA: Diagnosis present

## 2023-08-08 DIAGNOSIS — Z7982 Long term (current) use of aspirin: Secondary | ICD-10-CM | POA: Diagnosis not present

## 2023-08-08 DIAGNOSIS — I63311 Cerebral infarction due to thrombosis of right middle cerebral artery: Secondary | ICD-10-CM | POA: Diagnosis not present

## 2023-08-08 DIAGNOSIS — E041 Nontoxic single thyroid nodule: Secondary | ICD-10-CM | POA: Diagnosis present

## 2023-08-08 DIAGNOSIS — Z8673 Personal history of transient ischemic attack (TIA), and cerebral infarction without residual deficits: Secondary | ICD-10-CM | POA: Diagnosis not present

## 2023-08-08 DIAGNOSIS — M542 Cervicalgia: Secondary | ICD-10-CM | POA: Diagnosis present

## 2023-08-08 DIAGNOSIS — R29700 NIHSS score 0: Secondary | ICD-10-CM | POA: Diagnosis present

## 2023-08-08 DIAGNOSIS — M4712 Other spondylosis with myelopathy, cervical region: Secondary | ICD-10-CM | POA: Diagnosis present

## 2023-08-08 DIAGNOSIS — Z823 Family history of stroke: Secondary | ICD-10-CM | POA: Diagnosis not present

## 2023-08-08 DIAGNOSIS — I639 Cerebral infarction, unspecified: Secondary | ICD-10-CM | POA: Diagnosis present

## 2023-08-08 DIAGNOSIS — Z79899 Other long term (current) drug therapy: Secondary | ICD-10-CM | POA: Diagnosis not present

## 2023-08-08 DIAGNOSIS — Z794 Long term (current) use of insulin: Secondary | ICD-10-CM | POA: Diagnosis not present

## 2023-08-08 DIAGNOSIS — Z888 Allergy status to other drugs, medicaments and biological substances status: Secondary | ICD-10-CM | POA: Diagnosis not present

## 2023-08-08 DIAGNOSIS — M4722 Other spondylosis with radiculopathy, cervical region: Secondary | ICD-10-CM | POA: Diagnosis present

## 2023-08-08 DIAGNOSIS — E66811 Obesity, class 1: Secondary | ICD-10-CM | POA: Diagnosis present

## 2023-08-08 DIAGNOSIS — Z885 Allergy status to narcotic agent status: Secondary | ICD-10-CM | POA: Diagnosis not present

## 2023-08-08 DIAGNOSIS — Z6834 Body mass index (BMI) 34.0-34.9, adult: Secondary | ICD-10-CM | POA: Diagnosis not present

## 2023-08-08 DIAGNOSIS — I119 Hypertensive heart disease without heart failure: Secondary | ICD-10-CM | POA: Diagnosis present

## 2023-08-08 DIAGNOSIS — E7800 Pure hypercholesterolemia, unspecified: Secondary | ICD-10-CM | POA: Diagnosis present

## 2023-08-08 DIAGNOSIS — R270 Ataxia, unspecified: Secondary | ICD-10-CM | POA: Diagnosis present

## 2023-08-08 DIAGNOSIS — Z91040 Latex allergy status: Secondary | ICD-10-CM | POA: Diagnosis not present

## 2023-08-08 LAB — GLUCOSE, CAPILLARY
Glucose-Capillary: 145 mg/dL — ABNORMAL HIGH (ref 70–99)
Glucose-Capillary: 154 mg/dL — ABNORMAL HIGH (ref 70–99)
Glucose-Capillary: 231 mg/dL — ABNORMAL HIGH (ref 70–99)
Glucose-Capillary: 239 mg/dL — ABNORMAL HIGH (ref 70–99)

## 2023-08-08 LAB — CBC
HCT: 40.2 % (ref 36.0–46.0)
Hemoglobin: 13.7 g/dL (ref 12.0–15.0)
MCH: 27.3 pg (ref 26.0–34.0)
MCHC: 34.1 g/dL (ref 30.0–36.0)
MCV: 80.2 fL (ref 80.0–100.0)
Platelets: 205 10*3/uL (ref 150–400)
RBC: 5.01 MIL/uL (ref 3.87–5.11)
RDW: 13.7 % (ref 11.5–15.5)
WBC: 8.4 10*3/uL (ref 4.0–10.5)
nRBC: 0 % (ref 0.0–0.2)

## 2023-08-08 LAB — BASIC METABOLIC PANEL WITH GFR
Anion gap: 7 (ref 5–15)
BUN: 17 mg/dL (ref 8–23)
CO2: 29 mmol/L (ref 22–32)
Calcium: 9.7 mg/dL (ref 8.9–10.3)
Chloride: 105 mmol/L (ref 98–111)
Creatinine, Ser: 1.2 mg/dL — ABNORMAL HIGH (ref 0.44–1.00)
GFR, Estimated: 49 mL/min — ABNORMAL LOW (ref >60)
Glucose, Bld: 176 mg/dL — ABNORMAL HIGH (ref 70–99)
Potassium: 3.8 mmol/L (ref 3.5–5.1)
Sodium: 141 mmol/L (ref 135–145)

## 2023-08-08 MED ORDER — GABAPENTIN 100 MG PO CAPS
200.0000 mg | ORAL_CAPSULE | Freq: Three times a day (TID) | ORAL | Status: DC
  Administered 2023-08-08 – 2023-08-09 (×3): 200 mg via ORAL
  Filled 2023-08-08 (×3): qty 2

## 2023-08-08 MED ORDER — OXYCODONE-ACETAMINOPHEN 5-325 MG PO TABS
2.0000 | ORAL_TABLET | ORAL | Status: DC | PRN
  Administered 2023-08-08 – 2023-08-09 (×3): 2 via ORAL
  Filled 2023-08-08 (×3): qty 2

## 2023-08-08 NOTE — Progress Notes (Signed)
 Speech Language Pathology Treatment: Cognitive-Linguistic  Patient Details Name: Carla Stokes MRN: [id number] DOB: [birth year] Today's Date: 08/08/2023 Time: 4696-2952 SLP Time Calculation (min) (ACUTE ONLY): 11 min  Assessment / Plan / Recommendation Clinical Impression  SLP conducted skilled therapy session targeting cognitive goals. SLP targeted medication management, as patient endorses utilizing medication organizer at home. At baseline, patient notes that she often forgets to take medications, but can usually remember to take them "before noon." SLP encouraged use of medication alarm on her phone to assist to recall. SLP then challenged patient to identify mistakes in a BID pill box simulation given medication instructions in written form. Patient required mod assist to interpret instructions and correctly identify mistakes. Provided education re: pharmacy program where medications may be able to come pre-sorted to assist with reduction of medication errors. Patient verbalized understanding. Patient was left in room with call bell in reach and alarm set. SLP will continue to target goals per plan of care.     HPI HPI: Pt is a 73 yo female presenting to [facility] on 08/04/23 with c/o neck pain and impaired balance. 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 PMH of HTN, DM II, obesity, DDD      SLP Plan  Continue with current plan of care          Recommendations                     Oral care BID   Intermittent Supervision/Assistance Cognitive communication deficit (W41.324)     Continue with current plan of care     Bernadine Melecio A Raianna Slight  08/08/2023, 12:58 PM

## 2023-08-08 NOTE — Plan of Care (Signed)

## 2023-08-08 NOTE — Progress Notes (Signed)
 Occupational Therapy Treatment Patient Details Name: Carla Stokes MRN: [id number] DOB: 06/12/1950 Today's Date: 08/08/2023   History of present illness Pt is a 73 yo female presenting to [facility] on 08/04/23 with c/o neck pain and impaired balance. 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 PMH of HTN, DM II, obesity, DDD.   OT comments  Pt progressing toward established OT goals. Challenging activity tolerance, strength, balance today with pt performing toileting with up to CGA, UB bathing with supervision in standing and LB bathing with up to CGA. Able to reach slightly outside BOS without UE support and needing UE support for dynamic balance tasks. Will continue to follow acutely.      If plan is discharge home, recommend the following:  Other (comment) (supervision assist for safety at home)   Equipment Recommendations  BSC/3in1;Other (comment) (RW)    Recommendations for Other Services      Precautions / Restrictions Precautions Precautions: Fall Recall of Precautions/Restrictions: Intact Restrictions Weight Bearing Restrictions Per Provider Order: No       Mobility Bed Mobility Overal bed mobility: Needs Assistance Bed Mobility: Rolling, Sidelying to Sit, Sit to Sidelying Rolling: Supervision, Used rails Sidelying to sit: Contact guard assist     Sit to sidelying: Contact guard assist General bed mobility comments: increased time and cues for technique though pt reports they had instructed her in technique previously    Transfers Overall transfer level: Needs assistance Equipment used: Rolling walker (2 wheels) Transfers: Sit to/from Stand Sit to Stand: Supervision, Contact guard assist           General transfer comment: initially needed 2 attempts up from EOB but then able to rise from armless chair same height and low commode     Balance Overall balance assessment: Needs assistance Sitting-balance support: No upper extremity  supported, Feet supported Sitting balance-Leahy Scale: Good Sitting balance - Comments: completing toilet hygiene unaided as well as LB ADL   Standing balance support: Bilateral upper extremity supported, During functional activity Standing balance-Leahy Scale: Fair Standing balance comment: UE support for balance dynamically                           ADL either performed or assessed with clinical judgement   ADL Overall ADL's : Needs assistance/impaired     Grooming: Supervision/safety;Standing;Wash/dry face;Oral care   Upper Body Bathing: Supervision/ safety;Standing   Lower Body Bathing: Contact guard assist;Sit to/from stand   Upper Body Dressing : Sitting;Set up   Lower Body Dressing: Contact guard assist;Sitting/lateral leans;Sit to/from stand Lower Body Dressing Details (indicate cue type and reason): figure 4 for socks Toilet Transfer: Contact guard assist;Rolling walker (2 wheels);Ambulation           Functional mobility during ADLs: Contact guard assist;Rolling walker (2 wheels)      Extremity/Trunk Assessment Upper Extremity Assessment Upper Extremity Assessment: RUE deficits/detail RUE Deficits / Details: weak grasp compared to L, MMT 4/5 throughout. pain with full shoulder AROM. RUE: Shoulder pain with ROM RUE Sensation: WNL RUE Coordination: decreased fine motor   Lower Extremity Assessment Lower Extremity Assessment: Defer to PT evaluation        Vision   Vision Assessment?: Vision impaired- to be further tested in functional context Additional Comments: Pt reports blurred vision and cataracts. per previous OT notes, pt reported having cataracts   Perception     Praxis     Communication Communication Communication: No apparent difficulties   Cognition  Arousal: Alert Behavior During Therapy: WFL for tasks assessed/performed Cognition: Cognition impaired     Awareness: Online awareness impaired, Intellectual awareness intact  (intermittent cues for safety with RW)   Attention impairment (select first level of impairment): Selective attention, Alternating attention   OT - Cognition Comments: follows all commands, able to carry coversation while mobilizing and performing ADL. pt is oriented and aware of current physical limitations                 Following commands: Impaired Following commands impaired: Follows multi-step commands with increased time      Cueing   Cueing Techniques: Verbal cues  Exercises      Shoulder Instructions       General Comments      Pertinent Vitals/ Pain       Pain Assessment Pain Assessment: No/denies pain Faces Pain Scale: Hurts little more Pain Location: back of neck Pain Descriptors / Indicators: Discomfort, Aching Pain Intervention(s): Limited activity within patient's tolerance  Home Living                                          Prior Functioning/Environment              Frequency  Min 2X/week        Progress Toward Goals  OT Goals(current goals can now be found in the care plan section)  Progress towards OT goals: Progressing toward goals  Acute Rehab OT Goals Patient Stated Goal: get better OT Goal Formulation: With patient Time For Goal Achievement: 08/19/23 Potential to Achieve Goals: Good ADL Goals Pt Will Perform Grooming: with modified independence;standing Pt Will Perform Lower Body Bathing: with modified independence;sitting/lateral leans Pt Will Perform Lower Body Dressing: with modified independence;sit to/from stand Pt Will Transfer to Toilet: with modified independence;ambulating;bedside commode  Plan      Co-evaluation                 AM-PAC OT "6 Clicks" Daily Activity     Outcome Measure   Help from another person eating meals?: None Help from another person taking care of personal grooming?: A Little Help from another person toileting, which includes using toliet, bedpan, or urinal?: A  Little Help from another person bathing (including washing, rinsing, drying)?: A Little Help from another person to put on and taking off regular upper body clothing?: A Little Help from another person to put on and taking off regular lower body clothing?: A Little 6 Click Score: 19    End of Session Equipment Utilized During Treatment: Gait belt;Rolling walker (2 wheels)  OT Visit Diagnosis: Unsteadiness on feet (R26.81);Muscle weakness (generalized) (M62.81);Pain Pain - Right/Left: Right Pain - part of body: Shoulder   Activity Tolerance Patient tolerated treatment well   Patient Left in bed;with call bell/phone within reach;with bed alarm set   Nurse Communication Mobility status        Time: 6045-4098 OT Time Calculation (min): 35 min  Charges: OT General Charges $OT Visit: 1 Visit OT Treatments $Self Care/Home Management : 23-37 mins  Karilyn Ouch, OTR/L [facility] Acute Rehabilitation Office: [phone]   Carla Stokes 08/08/2023, 11:10 AM

## 2023-08-08 NOTE — Progress Notes (Signed)
 PROGRESS NOTE  Carla Stokes NUU:[id number] DOB: 04-14-1950 DOA: 08/04/2023 PCP: Madlyn Schirmer, MD   LOS: 0 days   Brief narrative:  Carla Stokes is a 73 y.o. female with medical history significant of hypertension, hyperlipidemia, insulin-dependent type 2 diabetes, obesity presented hospital with neck pain on the right side for a month which was progressive in nature causing her unable to sleep at home.  Patient had gone to see her primary care provider and had been prescribed medications and patches which did not help.  X-ray of her neck was done which showed arthritis.  Patient did not improve so was brought into the hospital.  In the hospital brain MRI and C-spine showed a subacute infarct in the right centrum semiovale.  She has severe bilateral neuroforaminal stenosis and severe spinal canal stenosis with compression of the spinal cord at C2-C6.  Patient was then admitted hospital for further evaluation and treatment.   Assessment/Plan: Principal Problem:   CVA (cerebral vascular accident) [facility]) Active Problems:   Cervical stenosis of spinal canal   Thyroid nodule   Essential hypertension   Hyperlipidemia   Type 2 diabetes mellitus (HCC)    CVA (cerebral vascular accident)  Seen by neurology during hospitalization.  Continue aspirin .  LDL was 49 Crestor 20 daily.  2D echocardiogram showed mild diastolic dysfunction grade 1.  Neurology has signed off at this time.  Advised lifestyle modifications.  Ambulatory referral to neurology has been placed in.    Cervical stenosis of spinal canal with intractable neck pain Currently on 5-day prednisone burst 40 mg daily.  Continue Percocet gabapentin.  Patient still complains of severe pain in the neck with some sharp component to it.  Will increase the dose of gabapentin to 200 mg 3 times daily from 100 mg 3 times daily.  Will also increase the dose of Percocet to 10 mg since patient complains of still pain even on slight movement  of the neck.   Patient was seen by neurosurgery Dr Larrie Po during hospitalization and recommended outpatient neurosurgical follow-up for surgical management..  Patient has been seen by physical therapy and recommend home health PT on discharge.  Will continue to optimize medications for pain if possible.                        Thyroid nodule Recommend outpatient PCP follow-up.    Essential hypertension Continue Norvasc's daily     Hyperlipidemia Continue Crestor     Type 2 diabetes mellitus Currently on sliding scale insulin and Semglee.  Closely monitor blood glucose levels while on steroids.  Latest POC glucose of 189  Class 1 obesity.Body mass index is 34.3 kg/m.  Would benefit from lifestyle medications and weight loss  DVT prophylaxis: SCD's Start: 08/05/23 3474   Disposition: Likely home with home health 08/09/23 if pain is better controlled  Status is: Observation  The patient will require care spanning > 2 midnights and should be moved to inpatient because: Uncontrolled pain, pain medication adjustment,    Code Status:     Code Status: Full Code  Family Communication: None at bedside  Consultants: Neurosurgery Neurology  Procedures: None  Anti-infectives:  None  Anti-infectives (From admission, onward)    None      Subjective: Today, patient was seen and examined at bedside.  Patient still complains of severe pain in the neck which is sharp especially on mobility and tells not much improved from yesterday.  Denies any fever shortness  of breath nausea vomiting.  Objective: Vitals:   08/08/23 0337 08/08/23 0731  BP: (!) 149/72 (!) 161/75  Pulse: (!) 58 62  Resp: 16 16  Temp: 97.9 F (36.6 C) 98.4 F (36.9 C)  SpO2: 97% 100%    Intake/Output Summary (Last 24 hours) at 08/08/2023 0904 Last data filed at 08/07/2023 2108 Gross per 24 hour  Intake 1140 ml  Output --  Net 1140 ml   Filed Weights   08/04/23 1313  Weight: 99.3 kg   Body mass index  is 34.3 kg/m.   Physical Exam:  GENERAL: Patient is alert awake and oriented.   Obese build, in mild distress due to pain HENT: No scleral pallor or icterus. Pupils equally reactive to light. Oral mucosa is moist,  NECK: is supple, no gross swelling noted.restricted movement of the neck, tenderness on palpation of the neck CHEST: Clear to auscultation.  Diminished breath sounds bilaterally. CVS: S1 and S2 heard, no murmur. Regular rate and rhythm.  ABDOMEN: Soft, non-tender, bowel sounds are present. EXTREMITIES: No edema. CNS: Cranial nerves are intact.  Moves all extremities SKIN: warm and dry without rashes.  Data Review: I have personally reviewed the following laboratory data and studies,  CBC: Recent Labs  Lab 08/04/23 1627 08/04/23 1629 08/06/23 0753 08/07/23 0450 08/08/23 0435  WBC  --  5.9 9.9 7.2 8.4  NEUTROABS  --  3.8  --   --   --   HGB 14.3 14.4 13.7 13.5 13.7  HCT 42.0 42.3 39.4 39.8 40.2  MCV  --  82.3 78.5* 81.9 80.2  PLT  --  187 207 202 205   Basic Metabolic Panel: Recent Labs  Lab 08/04/23 1627 08/04/23 1629 08/06/23 0753 08/07/23 0450 08/08/23 0435  NA 144 145 139 142 141  K 3.8 3.9 3.7 4.1 3.8  CL 103 104 103 107 105  CO2  --  28 27 26 29   GLUCOSE 106* 109* 127* 116* 176*  BUN 12 9 17 17 17   CREATININE 1.10* 1.05* 1.00 1.10* 1.20*  CALCIUM  --  9.9 9.2 9.3 9.7   Liver Function Tests: Recent Labs  Lab 08/04/23 1629  AST 34  ALT 37  ALKPHOS 54  BILITOT 0.5  PROT 7.1  ALBUMIN 4.2   No results for input(s): "LIPASE", "AMYLASE" in the last 168 hours. No results for input(s): "AMMONIA" in the last 168 hours. Cardiac Enzymes: No results for input(s): "CKTOTAL", "CKMB", "CKMBINDEX", "TROPONINI" in the last 168 hours. BNP (last 3 results) No results for input(s): "BNP" in the last 8760 hours.  ProBNP (last 3 results) No results for input(s): "PROBNP" in the last 8760 hours.  CBG: Recent Labs  Lab 08/07/23 0833 08/07/23 1206  08/07/23 1624 08/07/23 2115 08/08/23 0634  GLUCAP 114* 189* 208* 206* 145*   Recent Results (from the past 240 hours)  MRSA Next Gen by PCR, Nasal     Status: None   Collection Time: 08/05/23  8:20 AM   Specimen: Nasal Mucosa; Nasal Swab  Result Value Ref Range Status   MRSA by PCR Next Gen NOT DETECTED NOT DETECTED Final    Comment: (NOTE) The GeneXpert MRSA Assay (FDA approved for NASAL specimens only), is one component of a comprehensive MRSA colonization surveillance program. It is not intended to diagnose MRSA infection nor to guide or monitor treatment for MRSA infections. Test performance is not FDA approved in patients less than 95 years old. Performed at [facility], [address],  [place], [place] [address]      Studies: No results found.     Claretta Kendra, MD  [facility] 08/08/2023  If 7PM-7AM, please contact night-coverage

## 2023-08-09 ENCOUNTER — Other Ambulatory Visit (HOSPITAL_COMMUNITY): Payer: Self-pay

## 2023-08-09 DIAGNOSIS — I639 Cerebral infarction, unspecified: Secondary | ICD-10-CM | POA: Diagnosis not present

## 2023-08-09 DIAGNOSIS — M4802 Spinal stenosis, cervical region: Secondary | ICD-10-CM | POA: Diagnosis not present

## 2023-08-09 LAB — BASIC METABOLIC PANEL WITH GFR
Anion gap: 6 (ref 5–15)
BUN: 20 mg/dL (ref 8–23)
CO2: 26 mmol/L (ref 22–32)
Calcium: 9.4 mg/dL (ref 8.9–10.3)
Chloride: 108 mmol/L (ref 98–111)
Creatinine, Ser: 1.05 mg/dL — ABNORMAL HIGH (ref 0.44–1.00)
GFR, Estimated: 57 mL/min — ABNORMAL LOW (ref >60)
Glucose, Bld: 151 mg/dL — ABNORMAL HIGH (ref 70–99)
Potassium: 3.9 mmol/L (ref 3.5–5.1)
Sodium: 140 mmol/L (ref 135–145)

## 2023-08-09 LAB — CBC
HCT: 38.2 % (ref 36.0–46.0)
Hemoglobin: 13.3 g/dL (ref 12.0–15.0)
MCH: 27.7 pg (ref 26.0–34.0)
MCHC: 34.8 g/dL (ref 30.0–36.0)
MCV: 79.4 fL — ABNORMAL LOW (ref 80.0–100.0)
Platelets: 222 10*3/uL (ref 150–400)
RBC: 4.81 MIL/uL (ref 3.87–5.11)
RDW: 13.5 % (ref 11.5–15.5)
WBC: 8.7 10*3/uL (ref 4.0–10.5)
nRBC: 0 % (ref 0.0–0.2)

## 2023-08-09 LAB — GLUCOSE, CAPILLARY
Glucose-Capillary: 135 mg/dL — ABNORMAL HIGH (ref 70–99)
Glucose-Capillary: 186 mg/dL — ABNORMAL HIGH (ref 70–99)

## 2023-08-09 MED ORDER — PREDNISONE 20 MG PO TABS
40.0000 mg | ORAL_TABLET | Freq: Every day | ORAL | 0 refills | Status: AC
  Filled 2023-08-09: qty 4, 2d supply, fill #0

## 2023-08-09 MED ORDER — OXYCODONE-ACETAMINOPHEN 5-325 MG PO TABS
1.0000 | ORAL_TABLET | Freq: Four times a day (QID) | ORAL | 0 refills | Status: DC | PRN
  Filled 2023-08-09: qty 24, 3d supply, fill #0

## 2023-08-09 MED ORDER — GABAPENTIN 100 MG PO CAPS
200.0000 mg | ORAL_CAPSULE | Freq: Three times a day (TID) | ORAL | 0 refills | Status: AC
  Filled 2023-08-09: qty 180, 30d supply, fill #0

## 2023-08-09 MED ORDER — METHOCARBAMOL 750 MG PO TABS
750.0000 mg | ORAL_TABLET | Freq: Three times a day (TID) | ORAL | 0 refills | Status: AC | PRN
  Filled 2023-08-09: qty 15, 5d supply, fill #0

## 2023-08-09 MED ORDER — OXYCODONE-ACETAMINOPHEN 5-325 MG PO TABS: 1.0000 | ORAL_TABLET | Freq: Four times a day (QID) | ORAL | 0 refills | Status: AC | PRN

## 2023-08-09 MED ORDER — LIDOCAINE 4 % EX PTCH
1.0000 | MEDICATED_PATCH | CUTANEOUS | 0 refills | Status: AC
  Filled 2023-08-09: qty 7, 7d supply, fill #0
  Filled 2023-08-09: qty 6, 6d supply, fill #0

## 2023-08-09 MED ORDER — POLYETHYLENE GLYCOL 3350 17 G PO PACK
17.0000 g | PACK | Freq: Two times a day (BID) | ORAL | Status: DC
  Administered 2023-08-09: 17 g via ORAL
  Filled 2023-08-09: qty 1

## 2023-08-09 NOTE — Discharge Summary (Signed)
 Physician Discharge Summary  Carla Stokes HQI:[id number] DOB: 1950/03/28 DOA: 08/04/2023  PCP: Madlyn Schirmer, MD  Admit date: 08/04/2023 Discharge date: 08/09/2023  Time spent: 40 minutes  Recommendations for Outpatient Follow-up:  Follow outpatient CBC/CMP  Needs outpatient neurosurgery follow up  Follow with neurology outpatient Follow thyroid nodule outpatient, needs FNA  Discharge Diagnoses:  Principal Problem:   CVA (cerebral vascular accident) [facility]) Active Problems:   Cervical stenosis of spinal canal   Thyroid nodule   Essential hypertension   Hyperlipidemia   Type 2 diabetes mellitus (HCC)   Stroke [facility])   Discharge Condition: stable  Diet recommendation: heart healthy, diabetic   Filed Weights   08/04/23 1313  Weight: 99.3 kg    History of present illness:   Carla Stokes is Carla Stokes 73 y.o. female with medical history significant of hypertension, hyperlipidemia, insulin-dependent type 2 diabetes, obesity presented hospital with neck pain on the right side for Carla Stokes month which was progressive in nature causing her unable to sleep at home.  Patient had gone to see her primary care provider and had been prescribed medications and patches which did not help.  X-ray of her neck was done which showed arthritis.  Patient did not improve so was brought into the hospital.  In the hospital brain MRI and C-spine showed Carla Stokes subacute infarct in the right centrum semiovale.  She has severe bilateral neuroforaminal stenosis and severe spinal canal stenosis with compression of the spinal cord at C2-C6.    She's been seen by neurology, recommending aspirin  and crestor and outpatient follow up.    Neurosurgery recommending outpatient follow up for surgical management.   See below for additional details.   Hospital Course:  Assessment and Plan:   CVA (cerebral vascular accident)  MRI brain with subacute infarct within R centrum semiovale CTA head/neck without LVO, no  significant atherosclerosis of arteries in neck  Thought due to small vessel disease.  Seen by neurology during hospitalization.  They recommended aspirin  daily, crestor 20 mg.  A1c 7, LDL 49.  Echo with Plan for outpatient neurology follow up.  Continue aspirin .  LDL was 49 Crestor 20 daily.  2D echocardiogram showed mild diastolic dysfunction grade 1.  Neurology has signed off at this time.  Advised lifestyle modifications.  Ambulatory referral to neurology has been placed in.     Cervical stenosis of spinal canal with intractable neck pain MRI C spine with severe spinal canal stenosis with compression of spinal cord at C2-3, C3-4, C4-5, and C5-6.  Severe bilatearl neuro foraminal stenosis at C3-4, C4-5, and C5-6.  Severe L C6-7 neural foraminal stenosis.  Currently on 5-day prednisone burst 40 mg daily (2 doses pending at discharge).   Will discharge with gabapentin Short course of percocet and muscle relaxer  Patient was seen by neurosurgery who recommended outpatient neurosurgical follow-up for surgical management..  planning for [facility] at discharge.                      Thyroid nodule Meets criteria for FNA, follow outpatient    Essential hypertension Continue Norvasc's daily   Hyperlipidemia Continue Crestor   Type 2 diabetes mellitus Resume home insulin at discharge (toujeo 80 units daily and ozempic)   Class 1 obesity Body mass index is 34.3 kg/m.    Procedures: Echo IMPRESSIONS     1. Left ventricular ejection fraction, by estimation, is 60 to 65%. The  left ventricle has normal function. The left ventricle has no  regional  wall motion abnormalities. There is moderate left ventricular hypertrophy.  Left ventricular diastolic  parameters are consistent with Grade I diastolic dysfunction (impaired  relaxation).   2. Right ventricular systolic function is normal. The right ventricular  size is normal.   3. The mitral valve is abnormal. No evidence of mitral valve   regurgitation. No evidence of mitral stenosis.   4. The aortic valve is tricuspid. There is moderate calcification of the  aortic valve. There is moderate thickening of the aortic valve. Aortic  valve regurgitation is not visualized. Aortic valve sclerosis is present,  with no evidence of aortic valve  stenosis.   5. The inferior vena cava is normal in size with greater than 50%  respiratory variability, suggesting right atrial pressure of 3 mmHg.   Consultations: Neurology neurosurgery  Discharge Exam: Vitals:   08/09/23 0826 08/09/23 1325  BP: 137/71 133/81  Pulse: (!) 59 (!) 57  Resp: 18   Temp: 98.2 F (36.8 C) 97.6 F (36.4 C)  SpO2: 98% 99%   Pain is improved She doesn't have any now Carla Stokes at bedside  General: No acute distress. Cardiovascular: Heart sounds show Carla Stokes regular rate, and rhythm.  Lungs: Clear to auscultation bilaterally Abdomen: Soft, nontender, nondistended  Neurological: Alert and oriented 3. Moves all extremities 4, 5/5 strength. Cranial nerves II through XII intact. Skin: Warm and dry. No rashes or lesions. Extremities: No clubbing or cyanosis. No edema.  Discharge Instructions   Discharge Instructions     Ambulatory referral to Neurology   Complete by: As directed    Follow up with stroke clinic NP at [facility] in about 4-6 weeks. Thanks.   Ambulatory referral to Neurology   Complete by: As directed    An appointment is requested in approximately: 2 weeks   Call MD for:  difficulty breathing, headache or visual disturbances   Complete by: As directed    Call MD for:  extreme fatigue   Complete by: As directed    Call MD for:  hives   Complete by: As directed    Call MD for:  persistant dizziness or light-headedness   Complete by: As directed    Call MD for:  persistant nausea and vomiting   Complete by: As directed    Call MD for:  redness, tenderness, or signs of infection (pain, swelling, redness, odor or green/yellow discharge around  incision site)   Complete by: As directed    Call MD for:  severe uncontrolled pain   Complete by: As directed    Call MD for:  temperature >100.4   Complete by: As directed    Diet - low sodium heart healthy   Complete by: As directed    Discharge instructions   Complete by: As directed    You were seen for Carla Stokes stroke and severe cervical stenosis.    You were seen by neurology for your stroke.  They recommend aspirin  and crestor.  You'll follow up with neurology outpatient.  I'll place Thaine Garriga referral.  You also had cervical stenosis.  You were seen by neurosurgery who recommended outpatient follow up for surgery.  Call Dr. Larrie Po' office for an appointment.  They'll plan for surgical management outpatient.  We'll send you home with 2 more days of steroids as well as percocet as needed.  We've started you on gabapentin.  You can also use Lamine Laton muscle relaxer if this helps.  You have Carla Stokes thyroid nodule that warrants outpatient follow up.  Based on  your ultrasound, you'll need Carla Stokes biopsy outpatient.  Return for new, recurrent, or worsening symptoms.   Please ask your PCP to request records from this hospitalization so they know what was done and what the next steps will be.   Increase activity slowly   Complete by: As directed       Allergies as of 08/09/2023       Reactions   Codeine    Other Reaction(s): Dizziness (intolerance)   Dapagliflozin Other (See Comments)   2017: yeast infection   Latex Itching        Medication List     STOP taking these medications    valsartan-hydrochlorothiazide 320-25 MG tablet Commonly known as: DIOVAN-HCT       TAKE these medications    acetaminophen  500 MG tablet Commonly known as: TYLENOL  Take 500-1,000 mg by mouth every 6 (six) hours as needed for moderate pain.   amLODipine 5 MG tablet Commonly known as: NORVASC Take 5 mg by mouth daily.   aspirin  EC 81 MG tablet Take 81 mg by mouth daily. Swallow whole.   B-D SINGLE USE SWABS  REGULAR Pads   gabapentin 100 MG capsule Commonly known as: NEURONTIN Take 2 capsules (200 mg total) by mouth 3 (three) times daily.   lidocaine  4 % Place 1 patch onto the skin daily. Remove & Discard patch within 12 hours or as directed by MD Start taking on: August 10, 2023   methocarbamol 750 MG tablet Commonly known as: ROBAXIN Take 1 tablet (750 mg total) by mouth every 8 (eight) hours as needed for up to 5 days for muscle spasms.   multivitamin with minerals Tabs tablet Take 1 tablet by mouth daily.   oxyCODONE -acetaminophen  5-325 MG tablet Commonly known as: PERCOCET/ROXICET Take 1-2 tablets by mouth every 6 (six) hours as needed for up to 3 days for severe pain (pain score 7-10).   Ozempic (2 MG/DOSE) 8 MG/3ML Sopn Generic drug: Semaglutide (2 MG/DOSE) Inject 2 mg into the skin every Wednesday.   predniSONE 20 MG tablet Commonly known as: DELTASONE Take 2 tablets (40 mg total) by mouth daily with breakfast for 2 days. Start taking on: August 10, 2023   rosuvastatin 20 MG tablet Commonly known as: CRESTOR Take 20 mg by mouth daily.   Toujeo SoloStar 300 UNIT/ML Solostar Pen Generic drug: insulin glargine (1 Unit Dial) Inject 80 Units into the skin at bedtime.   zinc gluconate 50 MG tablet Take 50 mg by mouth daily.               Durable Medical Equipment  (From admission, onward)           Start     Ordered   08/09/23 1127  For home use only DME Bedside commode  Once       Question:  Patient needs Carla Stokes bedside commode to treat with the following condition  Answer:  Weakness   08/09/23 1126   08/07/23 1049  For home use only DME Walker rolling  Once       Question Answer Comment  Walker: With 5 Inch Wheels   Patient needs Carla Stokes walker to treat with the following condition Stroke (HCC)      08/07/23 1048           Allergies  Allergen Reactions   Codeine     Other Reaction(s): Dizziness (intolerance)   Dapagliflozin Other (See Comments)    2017:  yeast infection   Latex Itching  Follow-up Information      [facility]. Schedule an appointment as soon as possible for Carla Stokes visit in 1 month(s).   Specialty: Neurology Why: stroke clinic Contact information: [address] [place]  [address] [phone]        Garry [place], MD. Schedule an appointment as soon as possible for Carla Stokes visit.   Specialty: Neurosurgery Contact information: [address] [place] [address] [phone]         [facility] Follow up.   Specialty: Home Health Services Why: [facility] will contact you for the first home visit. Contact information: [address] [place] [address] [phone]         Madlyn Schirmer, MD Follow up.   Specialty: Family Medicine Contact information: [address] [place] [address] [phone]                  The results of significant diagnostics from this hospitalization (including imaging, microbiology, ancillary and laboratory) are listed below for reference.    Significant Diagnostic Studies: US  THYROID Result Date: 08/06/2023 CLINICAL DATA:  Incidental on MRI. EXAM: THYROID ULTRASOUND TECHNIQUE: Ultrasound examination of the thyroid gland and adjacent soft tissues was performed. COMPARISON:  Cervical spine MRI 08/04/2023 FINDINGS: Parenchymal Echotexture: Markedly heterogenous Isthmus: 1.3 cm Right lobe: 6.5 x 3.0 x 3.4 cm Left lobe: 4.7 x 1.6 x 2.1 cm _________________________________________________________ Estimated total number of nodules >/= 1 cm: 1 Number of spongiform nodules >/=  2 cm not described below (TR1): 0 Number of mixed cystic and solid nodules >/= 1.5 cm not described below (TR2): 0 _________________________________________________________ Nodule # 1: Location: Right; Mid Maximum size: 3.1 cm; Other 2 dimensions: 2.7 x 3.8 cm Composition: solid/almost completely solid (2)  Echogenicity: isoechoic (1) Shape: not taller-than-wide (0) Margins: ill-defined (0) Echogenic foci: macrocalcifications (1) ACR TI-RADS total points: 4. ACR TI-RADS risk category: TR4 (4-6 points). ACR TI-RADS recommendations: **Given size (>/= 1.5 cm) and appearance, fine needle aspiration of this moderately suspicious nodule should be considered based on TI-RADS criteria. _________________________________________________________ Incidental note is made of Carla Stokes tiny subcentimeter nodule in the left mid gland. No further follow-up for this finding. IMPRESSION: 1. Ill-defined 3.1 cm TI-RADS category 4 nodule in the right mid gland meets criteria to consider fine-needle aspiration biopsy. Biopsy is recommended. The above is in keeping with the ACR TI-RADS recommendations - J Am Coll Radiol 2017;14:587-595. Electronically Signed   By: Fernando Hoyer M.D.   On: 08/06/2023 11:04   ECHOCARDIOGRAM COMPLETE Result Date: 08/05/2023    ECHOCARDIOGRAM REPORT   Patient Name:   [facility] Date of Exam: 08/05/2023 Medical Rec #:  [id number]            Height:       67.0 in Accession #:    [id number]           Weight:       219.0 lb Date of Birth:  September 25, 1950            BSA:          2.102 m Patient Age:    72 years             BP:           140/66 mmHg Patient Gender: F                    HR:  77 bpm. Exam Location:  Inpatient Procedure: 2D Echo, Color Doppler, Cardiac Doppler and Intracardiac            Opacification Agent (Both Spectral and Color Flow Doppler were            utilized during procedure). Indications:    Stroke  History:        Patient has no prior history of Echocardiogram examinations.                 Risk Factors:Hypertension, Diabetes, Dyslipidemia and Obesity.  Sonographer:    Travis Friedman RDCS Referring Phys: [id number] VASUNDHRA RATHORE IMPRESSIONS  1. Left ventricular ejection fraction, by estimation, is 60 to 65%. The left ventricle has normal function. The left ventricle has no regional wall  motion abnormalities. There is moderate left ventricular hypertrophy. Left ventricular diastolic parameters are consistent with Grade I diastolic dysfunction (impaired relaxation).  2. Right ventricular systolic function is normal. The right ventricular size is normal.  3. The mitral valve is abnormal. No evidence of mitral valve regurgitation. No evidence of mitral stenosis.  4. The aortic valve is tricuspid. There is moderate calcification of the aortic valve. There is moderate thickening of the aortic valve. Aortic valve regurgitation is not visualized. Aortic valve sclerosis is present, with no evidence of aortic valve stenosis.  5. The inferior vena cava is normal in size with greater than 50% respiratory variability, suggesting right atrial pressure of 3 mmHg. FINDINGS  Left Ventricle: Left ventricular ejection fraction, by estimation, is 60 to 65%. The left ventricle has normal function. The left ventricle has no regional wall motion abnormalities. Strain was performed and the global longitudinal strain is indeterminate. The left ventricular internal cavity size was normal in size. There is moderate left ventricular hypertrophy. Left ventricular diastolic parameters are consistent with Grade I diastolic dysfunction (impaired relaxation). Right Ventricle: The right ventricular size is normal. No increase in right ventricular wall thickness. Right ventricular systolic function is normal. Left Atrium: Left atrial size was normal in size. Right Atrium: Right atrial size was normal in size. Pericardium: There is no evidence of pericardial effusion. Mitral Valve: The mitral valve is abnormal. There is mild thickening of the mitral valve leaflet(s). There is mild calcification of the mitral valve leaflet(s). No evidence of mitral valve regurgitation. No evidence of mitral valve stenosis. Tricuspid Valve: The tricuspid valve is normal in structure. Tricuspid valve regurgitation is not demonstrated. No evidence of  tricuspid stenosis. Aortic Valve: The aortic valve is tricuspid. There is moderate calcification of the aortic valve. There is moderate thickening of the aortic valve. Aortic valve regurgitation is not visualized. Aortic valve sclerosis is present, with no evidence of aortic valve stenosis. Aortic valve mean gradient measures 4.0 mmHg. Aortic valve peak gradient measures 7.1 mmHg. Aortic valve area, by VTI measures 2.89 cm. Pulmonic Valve: The pulmonic valve was normal in structure. Pulmonic valve regurgitation is trivial. No evidence of pulmonic stenosis. Aorta: The aortic root is normal in size and structure. Venous: The inferior vena cava is normal in size with greater than 50% respiratory variability, suggesting right atrial pressure of 3 mmHg. IAS/Shunts: No atrial level shunt detected by color flow Doppler. Additional Comments: 3D was performed not requiring image post processing on an independent workstation and was indeterminate.  LEFT VENTRICLE PLAX 2D LVIDd:         3.80 cm   Diastology LVIDs:         2.40 cm   LV e' medial:  6.31 cm/s LV PW:         1.20 cm   LV E/e' medial:  8.6 LV IVS:        1.30 cm   LV e' lateral:   6.96 cm/s LVOT diam:     2.00 cm   LV E/e' lateral: 7.8 LV SV:         79 LV SV Index:   38 LVOT Area:     3.14 cm  RIGHT VENTRICLE RV Basal diam:  2.90 cm RV S prime:     22.40 cm/s TAPSE (M-mode): 2.5 cm LEFT ATRIUM              Index        RIGHT ATRIUM           Index LA diam:        3.40 cm  1.62 cm/m   RA Area:     16.40 cm LA Vol (A2C):   100.0 ml 47.58 ml/m  RA Volume:   44.10 ml  20.98 ml/m LA Vol (A4C):   53.8 ml  25.60 ml/m LA Biplane Vol: 75.8 ml  36.06 ml/m  AORTIC VALVE AV Area (Vmax):    2.65 cm AV Area (Vmean):   2.66 cm AV Area (VTI):     2.89 cm AV Vmax:           133.00 cm/s AV Vmean:          87.300 cm/s AV VTI:            0.275 m AV Peak Grad:      7.1 mmHg AV Mean Grad:      4.0 mmHg LVOT Vmax:         112.00 cm/s LVOT Vmean:        73.900 cm/s LVOT VTI:           0.253 m LVOT/AV VTI ratio: 0.92  AORTA Ao Root diam: 3.10 cm Ao Asc diam:  3.70 cm MITRAL VALVE MV Area (PHT): 3.21 cm    SHUNTS MV Decel Time: 236 msec    Systemic VTI:  0.25 m MV E velocity: 54.50 cm/s  Systemic Diam: 2.00 cm MV Berna Gitto velocity: 74.90 cm/s MV E/Carla Stokes ratio:  0.73 Janelle Mediate MD Electronically signed by Janelle Mediate MD Signature Date/Time: 08/05/2023/3:28:13 PM    Final    MR BRAIN WO CONTRAST Result Date: 08/04/2023 CLINICAL DATA:  Acute neurologic deficit EXAM: MRI HEAD WITHOUT CONTRAST MRI CERVICAL SPINE WITHOUT CONTRAST TECHNIQUE: Multiplanar, multiecho pulse sequences of the brain and surrounding structures, and cervical spine, to include the craniocervical junction and cervicothoracic junction, were obtained without intravenous contrast. COMPARISON:  CTA head neck 08/04/2023 FINDINGS: MRI HEAD FINDINGS Brain: There is hyperintensity on diffusion-weighted imaging within the right centrum semiovale. No associated ADC correlate. Chronic microhemorrhage in the medial left temporal lobe. There is multifocal hyperintense T2-weighted signal within the white matter. Parenchymal volume and CSF spaces are normal. The midline structures are normal. Vascular: Normal flow voids. Skull and upper cervical spine: Normal calvarium and skull base. Visualized upper cervical spine and soft tissues are normal. Sinuses/Orbits:No paranasal sinus fluid levels or advanced mucosal thickening. No mastoid or middle ear effusion. Normal orbits. MRI CERVICAL SPINE FINDINGS Alignment: Grade 1 retrolisthesis at C5-6 Vertebrae: No acute abnormality. There is flowing anterior osteophytosis at C4-6. Cord: Normal signal and morphology. Posterior Fossa, vertebral arteries, paraspinal tissues: Negative. Disc levels: C1-2: Unremarkable. 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 Severe spinal canal stenosis  with compression of the spinal cord. No neural foraminal stenosis. C3-4: Ossification of the posterior  longitudinal ligament. Severe spinal canal stenosis. There is compression of the spinal cord. Severe bilateral neural foraminal stenosis. C4-5: Ossification of posterior longitudinal ligament. Severe spinal canal stenosis. There is compression of the spinal cord. Severe bilateral neural foraminal stenosis. C5-6: Ossification of the posterior longitudinal ligament. Severe spinal canal stenosis. Spinal cord compression. Severe bilateral neural foraminal stenosis. C6-7: Small disc bulge. Moderate spinal canal stenosis. Severe left neural foraminal stenosis. C7-T1: Intermediate sized central disc protrusion. There is no spinal canal stenosis. No neural foraminal stenosis. IMPRESSION: 1. 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 No acute intracranial abnormality. 2. Severe spinal canal stenosis with compression of the spinal cord at C2-3, C3-4, C4-5 and C5-6. 3. Severe bilateral neural foraminal stenosis at C3-4, C4-5 and C5-6. 4. Severe left C6-7 neural foraminal stenosis. Electronically Signed   By: Juanetta Nordmann M.D.   On: 08/04/2023 21:48   MR Cervical Spine Wo Contrast Result Date: 08/04/2023 CLINICAL DATA:  Acute neurologic deficit EXAM: MRI HEAD WITHOUT CONTRAST MRI CERVICAL SPINE WITHOUT CONTRAST TECHNIQUE: Multiplanar, multiecho pulse sequences of the brain and surrounding structures, and cervical spine, to include the craniocervical junction and cervicothoracic junction, were obtained without intravenous contrast. COMPARISON:  CTA head neck 08/04/2023 FINDINGS: MRI HEAD FINDINGS Brain: There is hyperintensity on diffusion-weighted imaging within the right centrum semiovale. No associated ADC correlate. Chronic microhemorrhage in the medial left temporal lobe. There is multifocal hyperintense T2-weighted signal within the white matter. Parenchymal volume and CSF spaces are normal. The midline structures are normal. Vascular: Normal flow voids. Skull and upper cervical spine: Normal calvarium and  skull base. Visualized upper cervical spine and soft tissues are normal. Sinuses/Orbits:No paranasal sinus fluid levels or advanced mucosal thickening. No mastoid or middle ear effusion. Normal orbits. MRI CERVICAL SPINE FINDINGS Alignment: Grade 1 retrolisthesis at C5-6 Vertebrae: No acute abnormality. There is flowing anterior osteophytosis at C4-6. Cord: Normal signal and morphology. Posterior Fossa, vertebral arteries, paraspinal tissues: Negative. Disc levels: C1-2: Unremarkable. 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 Severe spinal canal stenosis with compression of the spinal cord. No neural foraminal stenosis. C3-4: Ossification of the posterior longitudinal ligament. Severe spinal canal stenosis. There is compression of the spinal cord. Severe bilateral neural foraminal stenosis. C4-5: Ossification of posterior longitudinal ligament. Severe spinal canal stenosis. There is compression of the spinal cord. Severe bilateral neural foraminal stenosis. C5-6: Ossification of the posterior longitudinal ligament. Severe spinal canal stenosis. Spinal cord compression. Severe bilateral neural foraminal stenosis. C6-7: Small disc bulge. Moderate spinal canal stenosis. Severe left neural foraminal stenosis. C7-T1: Intermediate sized central disc protrusion. There is no spinal canal stenosis. No neural foraminal stenosis. IMPRESSION: 1. 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 No acute intracranial abnormality. 2. Severe spinal canal stenosis with compression of the spinal cord at C2-3, C3-4, C4-5 and C5-6. 3. Severe bilateral neural foraminal stenosis at C3-4, C4-5 and C5-6. 4. Severe left C6-7 neural foraminal stenosis. Electronically Signed   By: Juanetta Nordmann M.D.   On: 08/04/2023 21:48   CT ANGIO HEAD NECK W WO CM Result Date: 08/04/2023 CLINICAL DATA:  Right-sided neck pain, pain radiating to back causing difficulty sleeping, balance difficulty. EXAM: CT ANGIOGRAPHY HEAD AND NECK  WITH AND WITHOUT CONTRAST TECHNIQUE: Multidetector CT imaging of the head and neck was performed using the standard protocol during bolus administration of intravenous contrast. Multiplanar CT image reconstructions and MIPs were obtained to evaluate the vascular  anatomy. Carotid stenosis measurements (when applicable) are obtained utilizing NASCET criteria, using the distal internal carotid diameter as the denominator. RADIATION DOSE REDUCTION: This exam was performed according to the departmental dose-optimization program which includes automated exposure control, adjustment of the mA and/or kV according to patient size and/or use of iterative reconstruction technique. CONTRAST:  75mL OMNIPAQUE  IOHEXOL  350 MG/ML SOLN COMPARISON:  None Available. FINDINGS: CT HEAD FINDINGS Brain: No acute intracranial hemorrhage. No CT evidence of acute infarct. No edema, mass effect, or midline shift. The basilar cisterns are patent. Ventricles: Ventricles are normal in size and configuration. Vascular: No hyperdense vessel. Intracranial atherosclerotic calcifications noted. Skull: No acute or aggressive finding. Sinuses/orbits: Left lens replacement. Orbits otherwise unremarkable. Mucous retention cyst in the partially visualized left maxillary sinus. Other: Mastoid air cells are clear. CTA NECK FINDINGS Aortic arch: Common origin of the brachiocephalic and left common carotid arteries. Imaged portion shows no evidence of aneurysm or dissection. No significant stenosis of the major arch vessel origins. Pulmonary arteries: As permitted by contrast timing, there are no filling defects in the visualized pulmonary arteries. Subclavian arteries: Patent bilaterally. Mild atherosclerosis at the origin of the brachiocephalic artery and along the proximal right subclavian without significant stenosis. Additional mild atherosclerosis at the origin of the left subclavian artery without significant stenosis. Right carotid system: No evidence  of dissection, stenosis (50% or greater), or occlusion. Minimal calcified atherosclerosis at the carotid bifurcation. Left carotid system: No evidence of dissection, stenosis (50% or greater), or occlusion. Minimal calcified atherosclerosis at the carotid bifurcation. Vertebral arteries: Codominant. Patent from the origins to the vertebrobasilar confluence. No evidence of occlusion or dissection. Atherosclerosis involving the left vertebral artery at the V3/V4 junction extending into the proximal V4 segment resulting in mild stenosis. Skeleton: No acute findings. Degenerative changes in the cervical spine. 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 There is significant spinal canal stenosis appreciated at C2-3, C3-4, C4-5, and C5-6. There is additional ossification of the posterior longitudinal ligament at C7-T1 with at least mild spinal canal stenosis. Facet arthrosis at multiple levels in the cervical spine there is significant foraminal narrowing at multiple levels particularly at C3-4 and C4-5. Other neck: The visualized airway is patent. No cervical lymphadenopathy. Large right thyroid nodule measuring at least 3.3 cm in diameter. Upper chest: Visualized lung apices are clear. Review of the MIP images confirms the above findings CTA HEAD FINDINGS ANTERIOR CIRCULATION: The intracranial ICAs are patent bilaterally. Mild atherosclerosis of the bilateral carotid siphons. Mild stenosis of the bilateral cavernous ICAs. Additional moderate stenosis of the left supraclinoid ICA. No proximal occlusion, aneurysm, or vascular malformation. MCAs: The middle cerebral arteries are patent bilaterally. ACAs: The anterior cerebral arteries are patent bilaterally. POSTERIOR CIRCULATION: No significant stenosis, proximal occlusion, aneurysm, or vascular malformation. PCAs: The posterior cerebral arteries are patent bilaterally. Pcomm: Not well visualized SCAs:  The superior cerebellar arteries are patent bilaterally. Basilar artery: Patent AICAs: Patent PICAs: Patent Vertebral arteries: As above. Venous sinuses: As permitted by contrast timing, patent. Anatomic variants: None Review of the MIP images confirms the above findings IMPRESSION: No large vessel occlusion. No CT evidence of acute intracranial abnormality. No significant atherosclerosis of the arteries in the neck. 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 Atherosclerosis involving the left vertebral artery at the V3/V4 junction and proximal V4 segment resulting in mild stenosis. 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 Significant osseous spinal canal narrowing at multiple levels. Additional significant osseous foraminal narrowing. Recommend correlation with MRI cervical spine. Right thyroid nodule measuring up to 3.3 cm. Recommend nonemergent correlation with thyroid ultrasound if not previously performed. Electronically Signed   By: Denny Flack M.D.   On: 08/04/2023 17:58    Microbiology: Recent Results (from the past 240 hours)  MRSA Next Gen by PCR, Nasal     Status: None   Collection Time: 08/05/23  8:20 AM   Specimen: Nasal Mucosa; Nasal Swab  Result Value Ref Range Status   MRSA by PCR Next Gen NOT DETECTED NOT DETECTED Final    Comment: (NOTE) The GeneXpert MRSA Assay (FDA approved for NASAL specimens only), is one component of [place] comprehensive MRSA colonization surveillance program. It is not intended to diagnose MRSA infection nor to guide or monitor treatment for MRSA infections. Test performance is not FDA approved in patients less than 71 years old. Performed at [name], [address] [place], [place] [address]      Labs: Basic Metabolic Panel: Recent Labs  Lab 08/04/23 1629 08/06/23 0753  08/07/23 0450 08/08/23 0435 08/09/23 0445  NA 145 139 142 141 140  K 3.9 3.7 4.1 3.8 3.9  CL 104 103 107 105 108  CO2 28 27 26 29 26   GLUCOSE 109* 127* 116* 176* 151*  BUN 9 17 17 17 20   CREATININE 1.05* 1.00 1.10* 1.20* 1.05*  CALCIUM 9.9 9.2 9.3 9.7 9.4   Liver Function Tests: Recent Labs  Lab 08/04/23 1629  AST 34  ALT 37  ALKPHOS 54  BILITOT 0.5  PROT 7.1  ALBUMIN 4.2   No results for input(s): LIPASE, AMYLASE in the last 168 hours. No results for input(s): AMMONIA in the last 168 hours. CBC: Recent Labs  Lab 08/04/23 1629 08/06/23 0753 08/07/23 0450 08/08/23 0435 08/09/23 0445  WBC 5.9 9.9 7.2 8.4 8.7  NEUTROABS 3.8  --   --   --   --   HGB 14.4 13.7 13.5 13.7 13.3  HCT 42.3 39.4 39.8 40.2 38.2  MCV 82.3 78.5* 81.9 80.2 79.4*  PLT 187 207 202 205 222   Cardiac Enzymes: No results for input(s): CKTOTAL, CKMB, CKMBINDEX, TROPONINI in the last 168 hours. BNP: BNP (last 3 results) No results for input(s): BNP in the last 8760 hours.  ProBNP (last 3 results) No results for input(s): PROBNP in the last 8760 hours.  CBG: Recent Labs  Lab 08/08/23 1129 08/08/23 1636 08/08/23 2112 08/09/23 0618 08/09/23 1257  GLUCAP 154* 231* 239* 135* 186*       Signed:  Donnetta Gains MD.  [facility] 08/09/2023, 3:48 PM

## 2023-08-09 NOTE — Plan of Care (Signed)

## 2023-08-09 NOTE — TOC Transition Note (Signed)
 Transition of Care [facility]) - Discharge Note   Patient Details  Name: Carla Stokes MRN: [id number] Date of Birth: [birth year]  Transition of Care [facility]) CM/SW Contact:  Jonathan Neighbor, RN Phone Number: 08/09/2023, 3:22 PM   Clinical Narrative:     Pt is discharging home with home health PT through [facility]. [facility] will contact her for the first home visit.  Walker/ BSC at bedside for home. Pt has transportation home.  Final next level of care: Home w Home Health Services Barriers to Discharge: No Barriers Identified   Patient Goals and CMS Choice   CMS Medicare.gov Compare Post Acute Care list provided to:: Patient Choice offered to / list presented to : Patient      Discharge Placement                       Discharge Plan and Services Additional resources added to the After Visit Summary for     Discharge Planning Services: CM Consult Post Acute Care Choice: Home Health, Durable Medical Equipment          DME Arranged: Bedside commode DME Agency: [facility] Date DME Agency Contacted: 08/09/23   Representative spoke with at DME Agency: d/c lounge HH Arranged: PT HH Agency: [facility] Date [facility] Agency Contacted: 08/07/23   Representative spoke with at [facility] Agency: kelly  Social Drivers of Health (SDOH) Interventions SDOH Screenings   Food Insecurity: No Food Insecurity (08/06/2023)  Housing: Low Risk  (08/06/2023)  Transportation Needs: No Transportation Needs (08/06/2023)  Utilities: Not At Risk (08/06/2023)  Depression (PHQ2-9): Low Risk  (05/18/2022)  Social Connections: Moderately Integrated (08/06/2023)  Tobacco Use: Low Risk  (08/04/2023)     Readmission Risk Interventions     No data to display

## 2023-08-09 NOTE — Plan of Care (Signed)
 Problem: Education: Goal: Ability to describe self-care measures that may prevent or decrease complications (Diabetes Survival Skills Education) will improve 08/09/2023 0501 by Carla Balk, RN Outcome: Progressing 08/09/2023 0330 by Carla Balk, RN Outcome: Progressing Goal: Individualized Educational Video(s) 08/09/2023 0501 by Carla Balk, RN Outcome: Progressing 08/09/2023 0330 by Carla Balk, RN Outcome: Progressing   Problem: Coping: Goal: Ability to adjust to condition or change in health will improve 08/09/2023 0501 by Carla Balk, RN Outcome: Progressing 08/09/2023 0330 by Carla Balk, RN Outcome: Progressing   Problem: Fluid Volume: Goal: Ability to maintain a balanced intake and output will improve 08/09/2023 0501 by Carla Balk, RN Outcome: Progressing 08/09/2023 0330 by Carla Balk, RN Outcome: Progressing   Problem: Health Behavior/Discharge Planning: Goal: Ability to identify and utilize available resources and services will improve 08/09/2023 0501 by Carla Balk, RN Outcome: Progressing 08/09/2023 0330 by Braulio Calender D, RN Outcome: Progressing Goal: Ability to manage health-related needs will improve 08/09/2023 0501 by Carla Balk, RN Outcome: Progressing 08/09/2023 0330 by Braulio Calender D, RN Outcome: Progressing   Problem: Metabolic: Goal: Ability to maintain appropriate glucose levels will improve 08/09/2023 0501 by Carla Balk, RN Outcome: Progressing 08/09/2023 0330 by Braulio Calender D, RN Outcome: Progressing   Problem: Nutritional: Goal: Maintenance of adequate nutrition will improve 08/09/2023 0501 by Carla Balk, RN Outcome: Progressing 08/09/2023 0330 by Braulio Calender D, RN Outcome: Progressing Goal: Progress toward achieving an optimal weight will improve 08/09/2023 0501 by Carla Balk, RN Outcome: Progressing 08/09/2023 0330 by Braulio Calender D, RN Outcome: Progressing   Problem:  Skin Integrity: Goal: Risk for impaired skin integrity will decrease 08/09/2023 0501 by Braulio Calender D, RN Outcome: Progressing 08/09/2023 0330 by Braulio Calender D, RN Outcome: Progressing   Problem: Tissue Perfusion: Goal: Adequacy of tissue perfusion will improve 08/09/2023 0501 by Carla Balk, RN Outcome: Progressing 08/09/2023 0330 by Carla Balk, RN Outcome: Progressing   Problem: Education: Goal: Knowledge of disease or condition will improve 08/09/2023 0501 by Carla Balk, RN Outcome: Progressing 08/09/2023 0330 by Braulio Calender D, RN Outcome: Progressing Goal: Knowledge of secondary prevention will improve (MUST DOCUMENT ALL) 08/09/2023 0501 by Carla Balk, RN Outcome: Progressing 08/09/2023 0330 by Braulio Calender D, RN Outcome: Progressing Goal: Knowledge of patient specific risk factors will improve (DELETE if not current risk factor) 08/09/2023 0501 by Carla Balk, RN Outcome: Progressing 08/09/2023 0330 by Braulio Calender D, RN Outcome: Progressing   Problem: Ischemic Stroke/TIA Tissue Perfusion: Goal: Complications of ischemic stroke/TIA will be minimized 08/09/2023 0501 by Carla Balk, RN Outcome: Progressing 08/09/2023 0330 by Braulio Calender D, RN Outcome: Progressing   Problem: Coping: Goal: Will verbalize positive feelings about self 08/09/2023 0501 by Carla Balk, RN Outcome: Progressing 08/09/2023 0330 by Carla Balk, RN Outcome: Progressing Goal: Will identify appropriate support needs 08/09/2023 0501 by Carla Balk, RN Outcome: Progressing 08/09/2023 0330 by Carla Balk, RN Outcome: Progressing   Problem: Health Behavior/Discharge Planning: Goal: Ability to manage health-related needs will improve 08/09/2023 0501 by Carla Balk, RN Outcome: Progressing 08/09/2023 0330 by Carla Balk, RN Outcome: Progressing Goal: Goals will be collaboratively established with patient/family 08/09/2023 0501 by  Carla Balk, RN Outcome: Progressing 08/09/2023 0330 by Braulio Calender D, RN Outcome: Progressing   Problem: Self-Care: Goal: Ability to participate in self-care as condition permits will improve 08/09/2023 0501 by Carla Balk, RN Outcome: Progressing 08/09/2023  0330 by Carla Balk, RN Outcome: Progressing Goal: Verbalization of feelings and concerns over difficulty with self-care will improve 08/09/2023 0501 by Carla Balk, RN Outcome: Progressing 08/09/2023 0330 by Carla Balk, RN Outcome: Progressing Goal: Ability to communicate needs accurately will improve 08/09/2023 0501 by Carla Balk, RN Outcome: Progressing 08/09/2023 0330 by Carla Balk, RN Outcome: Progressing   Problem: Nutrition: Goal: Risk of aspiration will decrease 08/09/2023 0501 by Carla Balk, RN Outcome: Progressing 08/09/2023 0330 by Braulio Calender D, RN Outcome: Progressing Goal: Dietary intake will improve 08/09/2023 0501 by Carla Balk, RN Outcome: Progressing 08/09/2023 0330 by Carla Balk, RN Outcome: Progressing   Problem: Education: Goal: Knowledge of General Education information will improve Description: Including pain rating scale, medication(s)/side effects and non-pharmacologic comfort measures 08/09/2023 0501 by Carla Balk, RN Outcome: Progressing 08/09/2023 0330 by Carla Balk, RN Outcome: Progressing   Problem: Health Behavior/Discharge Planning: Goal: Ability to manage health-related needs will improve 08/09/2023 0501 by Carla Balk, RN Outcome: Progressing 08/09/2023 0330 by Braulio Calender D, RN Outcome: Progressing   Problem: Clinical Measurements: Goal: Ability to maintain clinical measurements within normal limits will improve 08/09/2023 0501 by Carla Balk, RN Outcome: Progressing 08/09/2023 0330 by Braulio Calender D, RN Outcome: Progressing Goal: Will remain free from infection 08/09/2023 0501 by Braulio Calender D,  RN Outcome: Progressing 08/09/2023 0330 by Braulio Calender D, RN Outcome: Progressing Goal: Diagnostic test results will improve 08/09/2023 0501 by Carla Balk, RN Outcome: Progressing 08/09/2023 0330 by Braulio Calender D, RN Outcome: Progressing Goal: Respiratory complications will improve 08/09/2023 0501 by Braulio Calender D, RN Outcome: Progressing 08/09/2023 0330 by Braulio Calender D, RN Outcome: Progressing Goal: Cardiovascular complication will be avoided 08/09/2023 0501 by Carla Balk, RN Outcome: Progressing 08/09/2023 0330 by Carla Balk, RN Outcome: Progressing   Problem: Activity: Goal: Risk for activity intolerance will decrease 08/09/2023 0501 by Carla Balk, RN Outcome: Progressing 08/09/2023 0330 by Braulio Calender D, RN Outcome: Progressing   Problem: Nutrition: Goal: Adequate nutrition will be maintained 08/09/2023 0501 by Carla Balk, RN Outcome: Progressing 08/09/2023 0330 by Braulio Calender D, RN Outcome: Progressing   Problem: Coping: Goal: Level of anxiety will decrease 08/09/2023 0501 by Carla Balk, RN Outcome: Progressing 08/09/2023 0330 by Braulio Calender D, RN Outcome: Progressing   Problem: Elimination: Goal: Will not experience complications related to bowel motility 08/09/2023 0501 by Carla Balk, RN Outcome: Progressing 08/09/2023 0330 by Braulio Calender D, RN Outcome: Progressing Goal: Will not experience complications related to urinary retention 08/09/2023 0501 by Carla Balk, RN Outcome: Progressing 08/09/2023 0330 by Braulio Calender D, RN Outcome: Progressing   Problem: Pain Managment: Goal: General experience of comfort will improve and/or be controlled 08/09/2023 0501 by Carla Balk, RN Outcome: Progressing 08/09/2023 0330 by Braulio Calender D, RN Outcome: Progressing   Problem: Safety: Goal: Ability to remain free from injury will improve 08/09/2023 0501 by Carla Balk, RN Outcome:  Progressing 08/09/2023 0330 by Braulio Calender D, RN Outcome: Progressing   Problem: Skin Integrity: Goal: Risk for impaired skin integrity will decrease 08/09/2023 0501 by Carla Balk, RN Outcome: Progressing 08/09/2023 0330 by Carla Balk, RN Outcome: Progressing

## 2023-08-09 NOTE — Progress Notes (Signed)
 Physical Therapy Treatment Patient Details Name: Carla Stokes MRN: [id number] DOB: 07/07/1950 Today's Date: 08/09/2023   History of Present Illness Pt is a 73 yo female presenting to [facility] on 08/04/23 with c/o neck pain and impaired balance. 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 PMH of HTN, DM II, obesity, DDD.    PT Comments  Pt is progressing towards goals. Currently Mod I with bed mobility, Supervision with sit to stand and gait with RW. Pt continues with impaired safety awareness and reports she will have assist at home. Due to pt current functional status, home set up and available assistance at home recommending skilled physical therapy services 3x/week in order to address strength, balance and functional mobility to decrease risk for falls, injury and re-hospitalization.       If plan is discharge home, recommend the following: A little help with walking and/or transfers;Assistance with cooking/housework;Direct supervision/assist for financial management;Assist for transportation;Supervision due to cognitive status     Equipment Recommendations  Rolling walker (2 wheels);BSC/3in1       Precautions / Restrictions Precautions Precautions: Fall Recall of Precautions/Restrictions: Intact Restrictions Weight Bearing Restrictions Per Provider Order: No     Mobility  Bed Mobility Overal bed mobility: Modified Independent Bed Mobility: Supine to Sit     Supine to sit: Modified independent (Device/Increase time)     General bed mobility comments: increased time    Transfers Overall transfer level: Needs assistance Equipment used: Rolling walker (2 wheels) Transfers: Sit to/from Stand Sit to Stand: Supervision           General transfer comment: supervision for safety. Pt has some decreased safety awareness and is slightly unstable when going to standing.    Ambulation/Gait Ambulation/Gait assistance: Supervision Gait Distance (Feet): 200  Feet Assistive device: Rolling walker (2 wheels) Gait Pattern/deviations: Step-through pattern, Decreased stride length Gait velocity: reduced Gait velocity interpretation: <1.8 ft/sec, indicate of risk for recurrent falls   General Gait Details: Supervision with increased time for turning    Modified Rankin (Stroke Patients Only) Modified Rankin (Stroke Patients Only) Pre-Morbid Rankin Score: No symptoms Modified Rankin: Moderate disability     Balance Overall balance assessment: Needs assistance Sitting-balance support: No upper extremity supported, Feet supported Sitting balance-Leahy Scale: Good Sitting balance - Comments: completing toilet hygiene unaided   Standing balance support: Bilateral upper extremity supported, During functional activity Standing balance-Leahy Scale: Fair Standing balance comment: UE support for balance dynamically      Communication Communication Communication: No apparent difficulties  Cognition Arousal: Alert Behavior During Therapy: WFL for tasks assessed/performed   PT - Cognitive impairments: Attention, Safety/Judgement       Following commands: Intact      Cueing Cueing Techniques: Verbal cues     General Comments General comments (skin integrity, edema, etc.): no signs/symptoms of cardiac/respiratory distress during session      Pertinent Vitals/Pain Pain Assessment Pain Assessment: No/denies pain     PT Goals (current goals can now be found in the care plan section) Acute Rehab PT Goals Patient Stated Goal: to improve PT Goal Formulation: With patient/family Time For Goal Achievement: 08/19/23 Potential to Achieve Goals: Good Progress towards PT goals: Progressing toward goals    Frequency    Min 2X/week      PT Plan  Continue with current POC        AM-PAC PT 6 Clicks Mobility   Outcome Measure  Help needed turning from your back to your side while in a flat  bed without using bedrails?: None Help  needed moving from lying on your back to sitting on the side of a flat bed without using bedrails?: None Help needed moving to and from a bed to a chair (including a wheelchair)?: A Little Help needed standing up from a chair using your arms (e.g., wheelchair or bedside chair)?: A Little Help needed to walk in hospital room?: A Little Help needed climbing 3-5 steps with a railing? : A Little 6 Click Score: 20    End of Session Equipment Utilized During Treatment: Gait belt Activity Tolerance: Patient tolerated treatment well Patient left: with call bell/phone within reach;in chair;with chair alarm set Nurse Communication: Mobility status PT Visit Diagnosis: Other abnormalities of gait and mobility (R26.89);Other symptoms and signs involving the nervous system (R29.898)     Time: 1117-1140 PT Time Calculation (min) (ACUTE ONLY): 23 min  Charges:    $Therapeutic Activity: 23-37 mins PT General Charges $$ ACUTE PT VISIT: 1 Visit                    Sloan Duncans, DPT, CLT  Acute Rehabilitation Services Office: [phone] (Secure chat preferred)    Carla Stokes 08/09/2023, 1:54 PM

## 2023-08-09 NOTE — Progress Notes (Deleted)
 Physician Discharge Summary  Carla Stokes ZOX:[id number] DOB: [birth year] DOA: 08/04/2023  PCP: Madlyn Schirmer, MD  Admit date: 08/04/2023 Discharge date: 08/09/2023  Time spent: 40 minutes  Recommendations for Outpatient Follow-up:  Follow outpatient CBC/CMP  Needs outpatient neurosurgery follow up  Follow with neurology outpatient Follow thyroid nodule outpatient, needs FNA  Discharge Diagnoses:  Principal Problem:   CVA (cerebral vascular accident) [facility]) Active Problems:   Cervical stenosis of spinal canal   Thyroid nodule   Essential hypertension   Hyperlipidemia   Type 2 diabetes mellitus (HCC)   Stroke [facility])   Discharge Condition: stable  Diet recommendation: heart healthy, diabetic   Filed Weights   08/04/23 1313  Weight: 99.3 kg    History of present illness:   Carla Stokes is Carla Stokes 73 y.o. female with medical history significant of hypertension, hyperlipidemia, insulin-dependent type 2 diabetes, obesity presented hospital with neck pain on the right side for Carla Stokes month which was progressive in nature causing her unable to sleep at home.  Patient had gone to see her primary care provider and had been prescribed medications and patches which did not help.  X-ray of her neck was done which showed arthritis.  Patient did not improve so was brought into the hospital.  In the hospital brain MRI and C-spine showed Carla Stokes subacute infarct in the right centrum semiovale.  She has severe bilateral neuroforaminal stenosis and severe spinal canal stenosis with compression of the spinal cord at C2-C6.    She's been seen by neurology, recommending aspirin  and crestor and outpatient follow up.    Neurosurgery recommending outpatient follow up for surgical management.   See below for additional details.   Hospital Course:  Assessment and Plan:   CVA (cerebral vascular accident)  MRI brain with subacute infarct within R centrum semiovale CTA head/neck without LVO, no  significant atherosclerosis of arteries in neck  Thought due to small vessel disease.  Seen by neurology during hospitalization.  They recommended aspirin  daily, crestor 20 mg.  A1c 7, LDL 49.  Echo with Plan for outpatient neurology follow up.  Continue aspirin .  LDL was 49 Crestor 20 daily.  2D echocardiogram showed mild diastolic dysfunction grade 1.  Neurology has signed off at this time.  Advised lifestyle modifications.  Ambulatory referral to neurology has been placed in.     Cervical stenosis of spinal canal with intractable neck pain MRI C spine with severe spinal canal stenosis with compression of spinal cord at C2-3, C3-4, C4-5, and C5-6.  Severe bilatearl neuro foraminal stenosis at C3-4, C4-5, and C5-6.  Severe L C6-7 neural foraminal stenosis.  Currently on 5-day prednisone burst 40 mg daily (2 doses pending at discharge).   Will discharge with gabapentin Short course of percocet and muscle relaxer  Patient was seen by neurosurgery who recommended outpatient neurosurgical follow-up for surgical management..  planning for [facility] at discharge.                      Thyroid nodule Meets criteria for FNA, follow outpatient    Essential hypertension Continue Norvasc's daily   Hyperlipidemia Continue Crestor   Type 2 diabetes mellitus Resume home insulin at discharge (toujeo 80 units daily and ozempic)   Class 1 obesity Body mass index is 34.3 kg/m.    Procedures: Echo IMPRESSIONS     1. Left ventricular ejection fraction, by estimation, is 60 to 65%. The  left ventricle has normal function. The left ventricle has no  regional  wall motion abnormalities. There is moderate left ventricular hypertrophy.  Left ventricular diastolic  parameters are consistent with Grade I diastolic dysfunction (impaired  relaxation).   2. Right ventricular systolic function is normal. The right ventricular  size is normal.   3. The mitral valve is abnormal. No evidence of mitral valve   regurgitation. No evidence of mitral stenosis.   4. The aortic valve is tricuspid. There is moderate calcification of the  aortic valve. There is moderate thickening of the aortic valve. Aortic  valve regurgitation is not visualized. Aortic valve sclerosis is present,  with no evidence of aortic valve  stenosis.   5. The inferior vena cava is normal in size with greater than 50%  respiratory variability, suggesting right atrial pressure of 3 mmHg.   Consultations: Neurology neurosurgery  Discharge Exam: Vitals:   08/09/23 0826 08/09/23 1325  BP: 137/71 133/81  Pulse: (!) 59 (!) 57  Resp: 18   Temp: 98.2 F (36.8 C) 97.6 F (36.4 C)  SpO2: 98% 99%   Pain is improved She doesn't have any now Carla Stokes at bedside  General: No acute distress. Cardiovascular: Heart sounds show Caylor Cerino regular rate, and rhythm.  Lungs: Clear to auscultation bilaterally Abdomen: Soft, nontender, nondistended  Neurological: Alert and oriented 3. Moves all extremities 4, 5/5 strength. Cranial nerves II through XII intact. Skin: Warm and dry. No rashes or lesions. Extremities: No clubbing or cyanosis. No edema.  Discharge Instructions   Discharge Instructions     Ambulatory referral to Neurology   Complete by: As directed    Follow up with stroke clinic NP at [facility] in about 4-6 weeks. Thanks.   Ambulatory referral to Neurology   Complete by: As directed    An appointment is requested in approximately: 2 weeks   Call MD for:  difficulty breathing, headache or visual disturbances   Complete by: As directed    Call MD for:  extreme fatigue   Complete by: As directed    Call MD for:  hives   Complete by: As directed    Call MD for:  persistant dizziness or light-headedness   Complete by: As directed    Call MD for:  persistant nausea and vomiting   Complete by: As directed    Call MD for:  redness, tenderness, or signs of infection (pain, swelling, redness, odor or green/yellow discharge around  incision site)   Complete by: As directed    Call MD for:  severe uncontrolled pain   Complete by: As directed    Call MD for:  temperature >100.4   Complete by: As directed    Diet - low sodium heart healthy   Complete by: As directed    Discharge instructions   Complete by: As directed    You were seen for Zaedyn Covin stroke and severe cervical stenosis.    You were seen by neurology for your stroke.  They recommend aspirin  and crestor.  You'll follow up with neurology outpatient.  I'll place Chrisette Man referral.  You also had cervical stenosis.  You were seen by neurosurgery who recommended outpatient follow up for surgery.  Call Dr. Larrie Po' office for an appointment.  They'll plan for surgical management outpatient.  We'll send you home with 2 more days of steroids as well as percocet as needed.  We've started you on gabapentin.  You can also use Flornce Record muscle relaxer if this helps.  You have Tenesha Garza thyroid nodule that warrants outpatient follow up.  Based on  your ultrasound, you'll need Nataya Bastedo biopsy outpatient.  Return for new, recurrent, or worsening symptoms.   Please ask your PCP to request records from this hospitalization so they know what was done and what the next steps will be.   Increase activity slowly   Complete by: As directed       Allergies as of 08/09/2023       Reactions   Codeine    Other Reaction(s): Dizziness (intolerance)   Dapagliflozin Other (See Comments)   2017: yeast infection   Latex Itching        Medication List     TAKE these medications    acetaminophen  500 MG tablet Commonly known as: TYLENOL  Take 500-1,000 mg by mouth every 6 (six) hours as needed for moderate pain.   amLODipine 5 MG tablet Commonly known as: NORVASC Take 5 mg by mouth daily.   aspirin  EC 81 MG tablet Take 81 mg by mouth daily. Swallow whole.   B-D SINGLE USE SWABS REGULAR Pads   gabapentin 100 MG capsule Commonly known as: NEURONTIN Take 2 capsules (200 mg total) by mouth 3 (three) times  daily.   lidocaine  5 % Commonly known as: LIDODERM  Place 1 patch onto the skin daily. Remove & Discard patch within 12 hours or as directed by MD Start taking on: August 10, 2023   methocarbamol 750 MG tablet Commonly known as: ROBAXIN Take 1 tablet (750 mg total) by mouth every 8 (eight) hours as needed for up to 5 days for muscle spasms.   multivitamin with minerals Tabs tablet Take 1 tablet by mouth daily.   oxyCODONE -acetaminophen  5-325 MG tablet Commonly known as: PERCOCET/ROXICET Take 1-2 tablets by mouth every 6 (six) hours as needed for up to 3 days for severe pain (pain score 7-10).   Ozempic (2 MG/DOSE) 8 MG/3ML Sopn Generic drug: Semaglutide (2 MG/DOSE) Inject 2 mg into the skin every Wednesday.   predniSONE 20 MG tablet Commonly known as: DELTASONE Take 2 tablets (40 mg total) by mouth daily with breakfast for 2 days. Start taking on: August 10, 2023   rosuvastatin 20 MG tablet Commonly known as: CRESTOR Take 20 mg by mouth daily.   Toujeo SoloStar 300 UNIT/ML Solostar Pen Generic drug: insulin glargine (1 Unit Dial) Inject 80 Units into the skin at bedtime.   valsartan-hydrochlorothiazide 320-25 MG tablet Commonly known as: DIOVAN-HCT Take 1 tablet by mouth daily.   zinc gluconate 50 MG tablet Take 50 mg by mouth daily.               Durable Medical Equipment  (From admission, onward)           Start     Ordered   08/09/23 1127  For home use only DME Bedside commode  Once       Question:  Patient needs Lenox Ladouceur bedside commode to treat with the following condition  Answer:  Weakness   08/09/23 1126   08/07/23 1049  For home use only DME Walker rolling  Once       Question Answer Comment  Walker: With 5 Inch Wheels   Patient needs Ronold Hardgrove walker to treat with the following condition Stroke (HCC)      08/07/23 1048           Allergies  Allergen Reactions   Codeine     Other Reaction(s): Dizziness (intolerance)   Dapagliflozin Other (See Comments)     2017: yeast infection   Latex Itching    Follow-up  Information     [facility]. Schedule an appointment as soon as possible for Meleena Munroe visit in 1 month(s).   Specialty: Neurology Why: stroke clinic Contact information: [address] [place]  [address] [phone]        Garry Kansas, MD. Schedule an appointment as soon as possible for Rain Wilhide visit.   Specialty: Neurosurgery Contact information: [address] [place] [address] [phone]         [facility] Follow up.   Specialty: Home Health Services Why: [facility] will contact you for the first home visit. Contact information: [address] [place] [address] [phone]         Madlyn Schirmer, MD Follow up.   Specialty: Family Medicine Contact information: [address] [place] [address] [phone]                  The results of significant diagnostics from this hospitalization (including imaging, microbiology, ancillary and laboratory) are listed below for reference.    Significant Diagnostic Studies: US  THYROID Result Date: 08/06/2023 CLINICAL DATA:  Incidental on MRI. EXAM: THYROID ULTRASOUND TECHNIQUE: Ultrasound examination of the thyroid gland and adjacent soft tissues was performed. COMPARISON:  Cervical spine MRI 08/04/2023 FINDINGS: Parenchymal Echotexture: Markedly heterogenous Isthmus: 1.3 cm Right lobe: 6.5 x 3.0 x 3.4 cm Left lobe: 4.7 x 1.6 x 2.1 cm _________________________________________________________ Estimated total number of nodules >/= 1 cm: 1 Number of spongiform nodules >/=  2 cm not described below (TR1): 0 Number of mixed cystic and solid nodules >/= 1.5 cm not described below (TR2): 0 _________________________________________________________ Nodule # 1: Location: Right; Mid Maximum size: 3.1 cm; Other 2 dimensions: 2.7 x 3.8 cm Composition: solid/almost completely solid  (2) Echogenicity: isoechoic (1) Shape: not taller-than-wide (0) Margins: ill-defined (0) Echogenic foci: macrocalcifications (1) ACR TI-RADS total points: 4. ACR TI-RADS risk category: TR4 (4-6 points). ACR TI-RADS recommendations: **Given size (>/= 1.5 cm) and appearance, fine needle aspiration of this moderately suspicious nodule should be considered based on TI-RADS criteria. _________________________________________________________ Incidental note is made of Carla Stokes tiny subcentimeter nodule in the left mid gland. No further follow-up for this finding. IMPRESSION: 1. Ill-defined 3.1 cm TI-RADS category 4 nodule in the right mid gland meets criteria to consider fine-needle aspiration biopsy. Biopsy is recommended. The above is in keeping with the ACR TI-RADS recommendations - J Am Coll Radiol 2017;14:587-595. Electronically Signed   By: Fernando Hoyer M.D.   On: 08/06/2023 11:04   ECHOCARDIOGRAM COMPLETE Result Date: 08/05/2023    ECHOCARDIOGRAM REPORT   Patient Name:   Carla Stokes Date of Exam: 08/05/2023 Medical Rec #:  [id number]            Height:       67.0 in Accession #:    [id number]           Weight:       219.0 lb Date of Birth:  [birth year]            BSA:          2.102 m Patient Age:    72 years             BP:           140/66 mmHg Patient Gender: F                    HR:  77 bpm. Exam Location:  Inpatient Procedure: 2D Echo, Color Doppler, Cardiac Doppler and Intracardiac            Opacification Agent (Both Spectral and Color Flow Doppler were            utilized during procedure). Indications:    Stroke  History:        Patient has no prior history of Echocardiogram examinations.                 Risk Factors:Hypertension, Diabetes, Dyslipidemia and Obesity.  Sonographer:    Travis Friedman RDCS Referring Phys: [id number] VASUNDHRA RATHORE IMPRESSIONS  1. Left ventricular ejection fraction, by estimation, is 60 to 65%. The left ventricle has normal function. The left ventricle has no regional  wall motion abnormalities. There is moderate left ventricular hypertrophy. Left ventricular diastolic parameters are consistent with Grade I diastolic dysfunction (impaired relaxation).  2. Right ventricular systolic function is normal. The right ventricular size is normal.  3. The mitral valve is abnormal. No evidence of mitral valve regurgitation. No evidence of mitral stenosis.  4. The aortic valve is tricuspid. There is moderate calcification of the aortic valve. There is moderate thickening of the aortic valve. Aortic valve regurgitation is not visualized. Aortic valve sclerosis is present, with no evidence of aortic valve stenosis.  5. The inferior vena cava is normal in size with greater than 50% respiratory variability, suggesting right atrial pressure of 3 mmHg. FINDINGS  Left Ventricle: Left ventricular ejection fraction, by estimation, is 60 to 65%. The left ventricle has normal function. The left ventricle has no regional wall motion abnormalities. Strain was performed and the global longitudinal strain is indeterminate. The left ventricular internal cavity size was normal in size. There is moderate left ventricular hypertrophy. Left ventricular diastolic parameters are consistent with Grade I diastolic dysfunction (impaired relaxation). Right Ventricle: The right ventricular size is normal. No increase in right ventricular wall thickness. Right ventricular systolic function is normal. Left Atrium: Left atrial size was normal in size. Right Atrium: Right atrial size was normal in size. Pericardium: There is no evidence of pericardial effusion. Mitral Valve: The mitral valve is abnormal. There is mild thickening of the mitral valve leaflet(s). There is mild calcification of the mitral valve leaflet(s). No evidence of mitral valve regurgitation. No evidence of mitral valve stenosis. Tricuspid Valve: The tricuspid valve is normal in structure. Tricuspid valve regurgitation is not demonstrated. No evidence  of tricuspid stenosis. Aortic Valve: The aortic valve is tricuspid. There is moderate calcification of the aortic valve. There is moderate thickening of the aortic valve. Aortic valve regurgitation is not visualized. Aortic valve sclerosis is present, with no evidence of aortic valve stenosis. Aortic valve mean gradient measures 4.0 mmHg. Aortic valve peak gradient measures 7.1 mmHg. Aortic valve area, by VTI measures 2.89 cm. Pulmonic Valve: The pulmonic valve was normal in structure. Pulmonic valve regurgitation is trivial. No evidence of pulmonic stenosis. Aorta: The aortic root is normal in size and structure. Venous: The inferior vena cava is normal in size with greater than 50% respiratory variability, suggesting right atrial pressure of 3 mmHg. IAS/Shunts: No atrial level shunt detected by color flow Doppler. Additional Comments: 3D was performed not requiring image post processing on an independent workstation and was indeterminate.  LEFT VENTRICLE PLAX 2D LVIDd:         3.80 cm   Diastology LVIDs:         2.40 cm   LV e' medial:  6.31 cm/s LV PW:         1.20 cm   LV E/e' medial:  8.6 LV IVS:        1.30 cm   LV e' lateral:   6.96 cm/s LVOT diam:     2.00 cm   LV E/e' lateral: 7.8 LV SV:         79 LV SV Index:   38 LVOT Area:     3.14 cm  RIGHT VENTRICLE RV Basal diam:  2.90 cm RV S prime:     22.40 cm/s TAPSE (M-mode): 2.5 cm LEFT ATRIUM              Index        RIGHT ATRIUM           Index LA diam:        3.40 cm  1.62 cm/m   RA Area:     16.40 cm LA Vol (A2C):   100.0 ml 47.58 ml/m  RA Volume:   44.10 ml  20.98 ml/m LA Vol (A4C):   53.8 ml  25.60 ml/m LA Biplane Vol: 75.8 ml  36.06 ml/m  AORTIC VALVE AV Area (Vmax):    2.65 cm AV Area (Vmean):   2.66 cm AV Area (VTI):     2.89 cm AV Vmax:           133.00 cm/s AV Vmean:          87.300 cm/s AV VTI:            0.275 m AV Peak Grad:      7.1 mmHg AV Mean Grad:      4.0 mmHg LVOT Vmax:         112.00 cm/s LVOT Vmean:        73.900 cm/s LVOT  VTI:          0.253 m LVOT/AV VTI ratio: 0.92  AORTA Ao Root diam: 3.10 cm Ao Asc diam:  3.70 cm MITRAL VALVE MV Area (PHT): 3.21 cm    SHUNTS MV Decel Time: 236 msec    Systemic VTI:  0.25 m MV E velocity: 54.50 cm/s  Systemic Diam: 2.00 cm MV Ardie Mclennan velocity: 74.90 cm/s MV E/Antion Andres ratio:  0.73 Carla Mediate MD Electronically signed by Carla Mediate MD Signature Date/Time: 08/05/2023/3:28:13 PM    Final    MR BRAIN WO CONTRAST Result Date: 08/04/2023 CLINICAL DATA:  Acute neurologic deficit EXAM: MRI HEAD WITHOUT CONTRAST MRI CERVICAL SPINE WITHOUT CONTRAST TECHNIQUE: Multiplanar, multiecho pulse sequences of the brain and surrounding structures, and cervical spine, to include the craniocervical junction and cervicothoracic junction, were obtained without intravenous contrast. COMPARISON:  CTA head neck 08/04/2023 FINDINGS: MRI HEAD FINDINGS Brain: There is hyperintensity on diffusion-weighted imaging within the right centrum semiovale. No associated ADC correlate. Chronic microhemorrhage in the medial left temporal lobe. There is multifocal hyperintense T2-weighted signal within the white matter. Parenchymal volume and CSF spaces are normal. The midline structures are normal. Vascular: Normal flow voids. Skull and upper cervical spine: Normal calvarium and skull base. Visualized upper cervical spine and soft tissues are normal. Sinuses/Orbits:No paranasal sinus fluid levels or advanced mucosal thickening. No mastoid or middle ear effusion. Normal orbits. MRI CERVICAL SPINE FINDINGS Alignment: Grade 1 retrolisthesis at C5-6 Vertebrae: No acute abnormality. There is flowing anterior osteophytosis at C4-6. Cord: Normal signal and morphology. Posterior Fossa, vertebral arteries, paraspinal tissues: Negative. Disc levels: C1-2: Unremarkable. 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 Severe spinal canal stenosis  with compression of the spinal cord. No neural foraminal stenosis. C3-4: Ossification of the posterior  longitudinal ligament. Severe spinal canal stenosis. There is compression of the spinal cord. Severe bilateral neural foraminal stenosis. C4-5: Ossification of posterior longitudinal ligament. Severe spinal canal stenosis. There is compression of the spinal cord. Severe bilateral neural foraminal stenosis. C5-6: Ossification of the posterior longitudinal ligament. Severe spinal canal stenosis. Spinal cord compression. Severe bilateral neural foraminal stenosis. C6-7: Small disc bulge. Moderate spinal canal stenosis. Severe left neural foraminal stenosis. C7-T1: Intermediate sized central disc protrusion. There is no spinal canal stenosis. No neural foraminal stenosis. IMPRESSION: 1. 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 No acute intracranial abnormality. 2. Severe spinal canal stenosis with compression of the spinal cord at C2-3, C3-4, C4-5 and C5-6. 3. Severe bilateral neural foraminal stenosis at C3-4, C4-5 and C5-6. 4. Severe left C6-7 neural foraminal stenosis. Electronically Signed   By: Juanetta Nordmann M.D.   On: 08/04/2023 21:48   MR Cervical Spine Wo Contrast Result Date: 08/04/2023 CLINICAL DATA:  Acute neurologic deficit EXAM: MRI HEAD WITHOUT CONTRAST MRI CERVICAL SPINE WITHOUT CONTRAST TECHNIQUE: Multiplanar, multiecho pulse sequences of the brain and surrounding structures, and cervical spine, to include the craniocervical junction and cervicothoracic junction, were obtained without intravenous contrast. COMPARISON:  CTA head neck 08/04/2023 FINDINGS: MRI HEAD FINDINGS Brain: There is hyperintensity on diffusion-weighted imaging within the right centrum semiovale. No associated ADC correlate. Chronic microhemorrhage in the medial left temporal lobe. There is multifocal hyperintense T2-weighted signal within the white matter. Parenchymal volume and CSF spaces are normal. The midline structures are normal. Vascular: Normal flow voids. Skull and upper cervical spine: Normal calvarium and  skull base. Visualized upper cervical spine and soft tissues are normal. Sinuses/Orbits:No paranasal sinus fluid levels or advanced mucosal thickening. No mastoid or middle ear effusion. Normal orbits. MRI CERVICAL SPINE FINDINGS Alignment: Grade 1 retrolisthesis at C5-6 Vertebrae: No acute abnormality. There is flowing anterior osteophytosis at C4-6. Cord: Normal signal and morphology. Posterior Fossa, vertebral arteries, paraspinal tissues: Negative. Disc levels: C1-2: Unremarkable. 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 Severe spinal canal stenosis with compression of the spinal cord. No neural foraminal stenosis. C3-4: Ossification of the posterior longitudinal ligament. Severe spinal canal stenosis. There is compression of the spinal cord. Severe bilateral neural foraminal stenosis. C4-5: Ossification of posterior longitudinal ligament. Severe spinal canal stenosis. There is compression of the spinal cord. Severe bilateral neural foraminal stenosis. C5-6: Ossification of the posterior longitudinal ligament. Severe spinal canal stenosis. Spinal cord compression. Severe bilateral neural foraminal stenosis. C6-7: Small disc bulge. Moderate spinal canal stenosis. Severe left neural foraminal stenosis. C7-T1: Intermediate sized central disc protrusion. There is no spinal canal stenosis. No neural foraminal stenosis. IMPRESSION: 1. 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 No acute intracranial abnormality. 2. Severe spinal canal stenosis with compression of the spinal cord at C2-3, C3-4, C4-5 and C5-6. 3. Severe bilateral neural foraminal stenosis at C3-4, C4-5 and C5-6. 4. Severe left C6-7 neural foraminal stenosis. Electronically Signed   By: Juanetta Nordmann M.D.   On: 08/04/2023 21:48   CT ANGIO HEAD NECK W WO CM Result Date: 08/04/2023 CLINICAL DATA:  Right-sided neck pain, pain radiating to back causing difficulty sleeping, balance difficulty. EXAM: CT ANGIOGRAPHY HEAD AND NECK  WITH AND WITHOUT CONTRAST TECHNIQUE: Multidetector CT imaging of the head and neck was performed using the standard protocol during bolus administration of intravenous contrast. Multiplanar CT image reconstructions and MIPs were obtained to evaluate the vascular  anatomy. Carotid stenosis measurements (when applicable) are obtained utilizing NASCET criteria, using the distal internal carotid diameter as the denominator. RADIATION DOSE REDUCTION: This exam was performed according to the departmental dose-optimization program which includes automated exposure control, adjustment of the mA and/or kV according to patient size and/or use of iterative reconstruction technique. CONTRAST:  75mL OMNIPAQUE  IOHEXOL  350 MG/ML SOLN COMPARISON:  None Available. FINDINGS: CT HEAD FINDINGS Brain: No acute intracranial hemorrhage. No CT evidence of acute infarct. No edema, mass effect, or midline shift. The basilar cisterns are patent. Ventricles: Ventricles are normal in size and configuration. Vascular: No hyperdense vessel. Intracranial atherosclerotic calcifications noted. Skull: No acute or aggressive finding. Sinuses/orbits: Left lens replacement. Orbits otherwise unremarkable. Mucous retention cyst in the partially visualized left maxillary sinus. Other: Mastoid air cells are clear. CTA NECK FINDINGS Aortic arch: Common origin of the brachiocephalic and left common carotid arteries. Imaged portion shows no evidence of aneurysm or dissection. No significant stenosis of the major arch vessel origins. Pulmonary arteries: As permitted by contrast timing, there are no filling defects in the visualized pulmonary arteries. Subclavian arteries: Patent bilaterally. Mild atherosclerosis at the origin of the brachiocephalic artery and along the proximal right subclavian without significant stenosis. Additional mild atherosclerosis at the origin of the left subclavian artery without significant stenosis. Right carotid system: No evidence  of dissection, stenosis (50% or greater), or occlusion. Minimal calcified atherosclerosis at the carotid bifurcation. Left carotid system: No evidence of dissection, stenosis (50% or greater), or occlusion. Minimal calcified atherosclerosis at the carotid bifurcation. Vertebral arteries: Codominant. Patent from the origins to the vertebrobasilar confluence. No evidence of occlusion or dissection. Atherosclerosis involving the left vertebral artery at the V3/V4 junction extending into the proximal V4 segment resulting in mild stenosis. Skeleton: No acute findings. Degenerative changes in the cervical spine. 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 There is significant spinal canal stenosis appreciated at C2-3, C3-4, C4-5, and C5-6. There is additional ossification of the posterior longitudinal ligament at C7-T1 with at least mild spinal canal stenosis. Facet arthrosis at multiple levels in the cervical spine there is significant foraminal narrowing at multiple levels particularly at C3-4 and C4-5. Other neck: The visualized airway is patent. No cervical lymphadenopathy. Large right thyroid nodule measuring at least 3.3 cm in diameter. Upper chest: Visualized lung apices are clear. Review of the MIP images confirms the above findings CTA HEAD FINDINGS ANTERIOR CIRCULATION: The intracranial ICAs are patent bilaterally. Mild atherosclerosis of the bilateral carotid siphons. Mild stenosis of the bilateral cavernous ICAs. Additional moderate stenosis of the left supraclinoid ICA. No proximal occlusion, aneurysm, or vascular malformation. MCAs: The middle cerebral arteries are patent bilaterally. ACAs: The anterior cerebral arteries are patent bilaterally. POSTERIOR CIRCULATION: No significant stenosis, proximal occlusion, aneurysm, or vascular malformation. PCAs: The posterior cerebral arteries are patent bilaterally. Pcomm: Not well visualized SCAs:  The superior cerebellar arteries are patent bilaterally. Basilar artery: Patent AICAs: Patent PICAs: Patent Vertebral arteries: As above. Venous sinuses: As permitted by contrast timing, patent. Anatomic variants: None Review of the MIP images confirms the above findings IMPRESSION: No large vessel occlusion. No CT evidence of acute intracranial abnormality. No significant atherosclerosis of the arteries in the neck. 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 Atherosclerosis involving the left vertebral artery at the V3/V4 junction and proximal V4 segment resulting in mild stenosis. 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 Significant osseous spinal canal narrowing at multiple levels. Additional significant osseous foraminal narrowing. Recommend correlation with MRI cervical spine. Right thyroid nodule measuring up to 3.3 cm. Recommend nonemergent correlation with thyroid ultrasound if not previously performed. Electronically Signed   By: Denny Flack M.D.   On: 08/04/2023 17:58    Microbiology: Recent Results (from the past 240 hours)  MRSA Next Gen by PCR, Nasal     Status: None   Collection Time: 08/05/23  8:20 AM   Specimen: Nasal Mucosa; Nasal Swab  Result Value Ref Range Status   MRSA by PCR Next Gen NOT DETECTED NOT DETECTED Final    Comment: (NOTE) The GeneXpert MRSA Assay (FDA approved for NASAL specimens only), is one component of Tashanna Dolin comprehensive MRSA colonization surveillance program. It is not intended to diagnose MRSA infection nor to guide or monitor treatment for MRSA infections. Test performance is not FDA approved in patients less than 54 years old. Performed at [facility], [address], [place], [place] [address]      Labs: Basic Metabolic Panel: Recent Labs  Lab 08/04/23 1629 08/06/23 0753  08/07/23 0450 08/08/23 0435 08/09/23 0445  NA 145 139 142 141 140  K 3.9 3.7 4.1 3.8 3.9  CL 104 103 107 105 108  CO2 28 27 26 29 26   GLUCOSE 109* 127* 116* 176* 151*  BUN 9 17 17 17 20   CREATININE 1.05* 1.00 1.10* 1.20* 1.05*  CALCIUM 9.9 9.2 9.3 9.7 9.4   Liver Function Tests: Recent Labs  Lab 08/04/23 1629  AST 34  ALT 37  ALKPHOS 54  BILITOT 0.5  PROT 7.1  ALBUMIN 4.2   No results for input(s): LIPASE, AMYLASE in the last 168 hours. No results for input(s): AMMONIA in the last 168 hours. CBC: Recent Labs  Lab 08/04/23 1629 08/06/23 0753 08/07/23 0450 08/08/23 0435 08/09/23 0445  WBC 5.9 9.9 7.2 8.4 8.7  NEUTROABS 3.8  --   --   --   --   HGB 14.4 13.7 13.5 13.7 13.3  HCT 42.3 39.4 39.8 40.2 38.2  MCV 82.3 78.5* 81.9 80.2 79.4*  PLT 187 207 202 205 222   Cardiac Enzymes: No results for input(s): CKTOTAL, CKMB, CKMBINDEX, TROPONINI in the last 168 hours. BNP: BNP (last 3 results) No results for input(s): BNP in the last 8760 hours.  ProBNP (last 3 results) No results for input(s): PROBNP in the last 8760 hours.  CBG: Recent Labs  Lab 08/08/23 1129 08/08/23 1636 08/08/23 2112 08/09/23 0618 08/09/23 1257  GLUCAP 154* 231* 239* 135* 186*       Signed:  Donnetta Gains MD.  [facility] 08/09/2023, 2:39 PM

## 2023-08-09 NOTE — Progress Notes (Signed)
 Patient's family unable to wait for prescriptions to be filled here at [facility]. Pharmacy technicians able to forward all meds, except Percocet to the [facility] in [place]. Reached out to Dr. Ada Acres, awaiting response.

## 2023-08-09 NOTE — Progress Notes (Signed)
    Durable Medical Equipment  (From admission, onward)           Start     Ordered   08/09/23 1127  For home use only DME Bedside commode  Once       Question:  Patient needs a bedside commode to treat with the following condition  Answer:  Weakness   08/09/23 1126   08/07/23 1049  For home use only DME Walker rolling  Once       Question Answer Comment  Walker: With 5 Inch Wheels   Patient needs a walker to treat with the following condition Stroke (HCC)      08/07/23 1048            BSC:   The patient is confined to one level of the home environment and there is no toilet on the that level of the home.

## 2023-08-16 ENCOUNTER — Ambulatory Visit: Payer: Medicare HMO | Admitting: Podiatry

## 2023-08-28 ENCOUNTER — Other Ambulatory Visit: Payer: Self-pay | Admitting: Neurosurgery

## 2023-09-04 NOTE — Progress Notes (Signed)
 Carla Stokes [birth year] [id number] Lamar LITTIE Hobby, MD 09/12/2023   ASSESSMENT/PLAN   1. Wellness examination (Primary) 09/12/2023  OVWELL: This is an evaluation without assigned complexity of medical decision making - Your annual wellness visit was completed today. Routine education was provided, including living a healthy lifestyle, cancer screening, and vaccinations. Testing, vaccinations and referrals were discussed, and ordered, as appropriate and desired.  - Tests needed include fasting lipid profile (cholesterol), fasting blood sugar. - Vaccinations needed include Influenza, Shingles (Shingrix, 2 dose), Coronavirus. - Your next wellness visit is due yearly - -   2. Encounter for screening mammogram for malignant neoplasm of breast 09/12/2023  OVSCABREAST: Screening for breast cancer was addressed at this visit - This review found the following information: Your risk of breast cancer is: Normal. Your current symptom status is: no symptoms present. Your current examination revealed: normal findings.  - Your next mammogram is due: 2026, 1 year - Routine education was provided.  - Recheck in 1 year - -   3. Screening for colon cancer 09/12/2023  OVSCACOLON: Screening for colorectal cancer was addressed at this visit - This review found the following information: Your risk of colon cancer is: Elevated. Your current symptom status is: no symptoms present. Your current examination revealed: normal findings.  - Your next test is due: 2027, 5 years - Testing reviewed and offered included: no testing due to age or medical infirmity, testing stools for blood, testing stools for blood and cellular markers of polyps and cancer, imaging, partial colonoscopy, full colonoscopy.  - The testing you selected is: testing options reviewed, including stool for blood, stool for polyps, imaging and colonoscopy/sigmoidoscopy with selected testing: , full colonoscopy - Routine education was provided.   - Recheck in 1 year - -   4. Benign hypertension 09/12/2023 OVHTN:  This is an established problem which is stable (LC MDM) - Your blood pressure is at your goal of JNC-8 HTN goals: for adults all ages over 18 years with diabetes: under 140 systolic and under 90 diastolic, optimal at 130/80. - Routine disease specific education is provided and medication use is reviewed and discussed. - A change in medication is not needed at this time - Continue to work on diet, exercise and healthy weight maintenance. - Monitor your blood pressure and send an update if not staying near your goal. - Monitor your blood pressure and send a list if the top number is staying under 120 or over 140, or if the bottom number is staying over 90  - Monitor your heart rate and send a list if staying under 60 or over 100 - Recheck in about 6 months - -   5. 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 Type 2 diabetes mellitus with diabetic peripheral angiopathy without gangrene, with long-term current use of insulin     (CMD) 7. Type 2 diabetes mellitus with diabetic neuropathy, with long-term current use of insulin  (HCC) 09/12/2023 OVDM:  This is an established problem with mild exacerbation, progression or side-effect of treatment (MC MDM). - Your type 2 diabetes is about at your goal of A1C 6.5 - 7.0% - Today your fasting blood glucose is 111 mg/dl, your J8R test is 3.4%. Your A1C test is getting worse, with your last A1C 6.0% - Medication use is reviewed and discussed. - A change in medication is not needed at this time. . - Diabetes education  was provided regarding medication, low and high blood glucose and response plan, diet, weight maintenance, vision care, and foot care. - Referral to a nutritionist and disease educator was discussed but is deferred at  this time by the patient. - Return for an evaluation and A1C test in about 6 months - -   8. Mixed hyperlipidemia 09/12/2023  OVLIPID: The lipid report is pending and you will be contacted when it has been evaluated. - Routine, medication, diet, exercise, and optimal weight maintenance education was provided. - Recheck in about 6 months - -   9. Obesity (BMI 30-39.9) 09/12/2023  OVOBESITY:  This is an established problem with mild exacerbation, progression or side-effect of treatment (MC MDM)  - Your weight is not at your optimal goal. - A weight higher than optimal leads to, and worsens, certain illnesses like diabetes, high blood pressure, high cholesterol, arthritis and sleep apnea related illnesses. - Routine education is provided. - You need to limit your daily calories. A smart phone app like my fitness pal can help with this process. - Referral to a nutritionist and disease educator was recommended but is deferred at this time by the patient.  - Referral to a weight loss program was recommended but is deferred at this time by the patient. - Ongoing exercise is needed, this should be done 5 days a week. Your selected exercise is: as able - Behavior modifications are discussed, including parking and walking, using stairs instead of elevators, taking longer to eat, etc. - Recheck in about 6 months - - Wt Readings from Last 10 Encounters:  09/12/23 104 kg (229 lb)  08/02/23 101 kg (222 lb 3.2 oz)  05/09/23 99.3 kg (219 lb)  03/31/23 99.3 kg (219 lb)  03/28/23 101 kg (223 lb)  02/28/23 99.2 kg (218 lb 9.6 oz)  08/03/22 103 kg (226 lb)  05/04/22 104 kg (230 lb)  04/13/22 103 kg (228 lb)  03/08/22 106 kg (234 lb)    10. Cervical stenosis of spinal canal (NSURG) 09/12/2023 - continue with neurosurgery - -  11. Cerebrovascular accident (CVA), unspecified mechanism    (CMD) 09/12/2023 OVCEREBROVASC:  This is an established problem which is stable (LC MDM) - Your brain artery  disease is at your goal of no symptoms or complications.  - A change in medication is not needed at this time. - Routine education is provided. - Control risk factors like high blood pressure, diabetes and cholesterol and avoid cigarette smoke.  - Report symptoms such as sudden onset weakness, numbness, vision disturbance, speech or swallowing difficulties, or other stroke-like symptoms. - Continue specialty care with your  neurologist (brain and nerves doctor) - Recheck in about 6 months - -   12. Thyroid  nodule 09/12/2023 - normal thyroid  test 01/2023, 07/2023 - biopsy recommended, discussed and deferred until after neck surgery - -   13. Type 2 diabetes mellitus with stage 3a chronic kidney disease, with long-term current use of insulin     (CMD) 14. Stage 3a chronic kidney disease (CMD) 09/12/2023  OVCKD:  This is an established problem with mild exacerbation, progression or side-effect of treatment (MC MDM)  - Your kidneys are slow at cleaning your blood, this is often related to high blood pressure and diabetes, but can also be the result of damage from other diseases and kidney injuries, and can be made worse by low blood pressure and certain medications - To slow the deterioration process, you must control risk factors like diabetes and high blood  pressure and avoid medications like advil (ibuprofen) and aleve .  - Monitoring with regular blood testing is needed, the next test is needed in about 6 months - An evaluation, or continuing care, by a nephrologist-kidney specialist is dicussed and recommended - Recheck in about 6 months - -  * stable on recent testing    41M OV30F, HTN/DM/LIPID, obesity, CKD, CBG, A1C, UACR, FLP CMP      Immunization History  Administered Date(s) Administered  . Covid-19 Vaccine Unspecified 01/14/2022, 11/04/2022  . Covid-19, Mrna, Lnp-s, Pf, Tris-sucrose, 30 Mcg/0.3 Ml 01/14/2022, 11/04/2022  . Influenza, High-dose Seasonal, Quadrivalent,  Preservative Free 12/18/2018, 12/10/2019, 12/08/2020  . Influenza, Recombinant, Quadrivalent, Injectable, Preservative Free (Egg Free) 12/08/2016  . Influenza, Unspecified 12/22/2017  . Influenza, high-dose, trivalent, PF 11/04/2022  . Moderna SARS-CoV-2 Primary Series 12+ yrs 04/15/2019, 05/13/2019, 02/18/2020  . Pneumococcal Conjugate 13-Valent 12/18/2018  . Pneumococcal Polysaccharide Vaccine, 23 Valent (PNEUMOVAX-23) 2Y+ 06/16/2021  . RSV, PF (AREXVY) 60Y+ 03/14/2023  . TDAP VACCINE (BOOSTRIX,ADACEL) 7Y+ 04/26/2023  . Varicella SQ (VARIVAX) 1Y+ 02/28/2013  . Varicella Zoster [facility]) 18Y+ 04/26/2023     - reviewed with patient  Health Maintenance Status       Date Due Completion Dates   Bone Density Scan (Ongoing) Never done ---   Bone Density Scan Never done ---   Diabetes: Foot Exam 10/06/2022 10/05/2021 (Done), 06/16/2021 (Done)   Comment on 10/05/2021: See Legacy System   Comment on 06/16/2021: See Legacy System   Comment on 09/08/2020: See Legacy System   Comment on 03/10/2020: See Legacy System   Comment on 05/14/2019: See Legacy System   COVID-19 Vaccine (6 - 2024-25 season) 05/04/2023 11/04/2022, 11/04/2022   Diabetes:  eGFR for Kidney Evaluation 08/03/2023 08/03/2022, 06/16/2021   Comprehensive Annual Visit 08/03/2023 08/03/2022, 08/03/2022   Medicare Annual Wellness (AWV) Subsequent Visits 08/03/2023 08/03/2022, 08/03/2022   Influenza Vaccine (1) 09/29/2023 11/04/2022, 12/08/2020   Diabetes: Retinopathy Screening Combo 11/23/2023 11/22/2022, 07/27/2021   Diabetes:  Quantitative uACR for Kidney Evaluation 02/28/2024 02/28/2023   Breast Cancer Screening (Mammogram) 05/16/2024 05/17/2023, 04/19/2022   Diabetes: Hemoglobin A1C 09/11/2024 09/12/2023, 09/12/2023   Depression Screening 09/11/2024 09/12/2023   Colorectal Cancer Screening 01/05/2026 01/05/2021, 01/05/2021   DTaP/Tdap/Td Vaccines (2 - Td or Tdap) 04/25/2033 04/26/2023       - reviewed with patient - other wellness providers: see's EYE  doctor for regular examinations, see's DENTIST for regular tooth and gum care  Relevant Interim History 02/28/2023 1. Benign hypertension 02/28/2023 OVHTN:  This is an established problem which is stable (LC MDM) - Your blood pressure is at your goal of JNC-8 HTN goals: for adults all ages over 18 years with diabetes: under 140 systolic and under 90 diastolic, optimal at 130/80. - Routine disease specific education is provided and medication use is reviewed and discussed. - A change in medication is not needed at this time - Continue to work on diet, exercise and healthy weight maintenance. - Monitor your blood pressure and send an update if not staying near your goal. - Monitor your blood pressure and send a list if the top number is staying under 120 or over 140, or if the bottom number is staying over 90  - Monitor your heart rate and send a list if staying under 60 or over 100 - Recheck in about 6 months - -    2. Type 2 diabetes mellitus with peripheral vascular disease (HCC) 3. 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 Type 2 diabetes mellitus with diabetic neuropathy, with long-term current use of insulin  (HCC) 02/28/2023 OVDM:  This is an established problem which is improving (LC MDM). - Your type 2 diabetes is not at your goal of A1C 6.5 - 7.0% - Today your fasting blood glucose is 124 mg/dl, your J8R test is 3.9%. Your A1C test is getting better, with your last A1C 6.6% - Medication use is reviewed and discussed. - A change in medication is needed at this time,decrease insulin  to 60 units, stop metformin . - Diabetes education was provided regarding medication, low and high blood glucose and response plan, diet, weight maintenance, vision care, and foot care. - Referral to a nutritionist and disease educator was  discussed but is deferred at this time by the patient. - Monitor your pre-meal glucose and send a list if staying under 100 or over 200.  - Send a blood glucose list in about 1 months - Return for an evaluation and A1C test in about 6 months - -    5. Mixed hyperlipidemia 02/28/2023  OVLIPID: The lipid report is pending and you will be contacted when it has been evaluated. - Routine, medication, diet, exercise, and optimal weight maintenance education was provided. - Recheck in about 6 months - -     83M OV30F: , AMV, WELL/scBR/scCOLON/scCX;, HTN/DM/LIPID, A1C, CBG, UACR, FLP CMP     History was provided by: patient, child  Chief Complaint   Chief Complaint  Patient presents with  . Annual Exam  . Hyperlipidemia  . Hypertension  . Diabetes   Current appointment note:  - 83M OV30F: , AMV, WELL/scBR/scCOLON/scCX;, HTN/DM/LIPID, A1C, CBG, UACR, FLP CMP  PT AWARE/FLP   HPI   Current health status - overall well, chronic diseases stable: yes - chronic problems needing attention today: yes - new/acute problems needing attention today: no - -  T2DM 09/12/2023  - pain, wt gain, pred and inc insulin  dose - CBG readings: only fasting, lowest 67 - highest 129 rare lows, no sx, discussed and discussed CGM - Symptoms: high/low glucose: none - Symptoms neuropathy: none - Symptoms vision: none. Last eye visit 2024, known abnormal findings: NA. Wt Readings from Last 3 Encounters:  09/12/23 104 kg (229 lb)  08/02/23 101 kg (222 lb 3.2 oz)  05/09/23 99.3 kg (219 lb)   BMI Readings from Last 3 Encounters:  09/12/23 35.87 kg/m  08/02/23 34.80 kg/m  05/09/23 34.30 kg/m   - Healthy lifestyle compliance: good with, or working towards: diet, including reduction of saturated fat and avoidance of trans fats;  exercise, at least 30 minutes of moderate intensity activities most days of the week;  discussed. - Medications: medication list: reviewed; compliance: good; adverse effects:  none. A1C trend Lab Results  Component Value Date   POCA1C 6.5 (A) 09/12/2023   POCA1C 6.0 (A) 02/28/2023   POCA1C 6.6 (A) 08/03/2022   HGBA1C 7.2 (A) 12/21/2021   HGBA1C 7.9 (A) 06/16/2021   HGBA1C 7.5 (A) 12/08/2020   HGBA1C 7.2 (A) 06/09/2020   HGBA1C 6.9 (A) 12/10/2019     - -  HTN 09/12/2023  - no issues - Managed CARDS:  no - Activity level:  Light (1-3 METS): self-grooming, light housework, sedentary activities in home, slow walking - Cardiovascular ROS - denies: fatigue, swelling, dyspnea, chest pain, cold feet, claudication, palpitations, fast heart beats, and fainting. - Neurological ROS - Denies: headache, vertigo/dizziness, balance problems, vision loss,  diplopia, sudden hearing loss, tinnitus, taste loss/change, swallowing problems, sensation loss or change, muscle weakness, paralysis, extremity tremor/rigidity/abnormal movements, mental status changes, disorientation, and memory loss. -  Weight trend:  stable Wt Readings from Last 3 Encounters:  09/12/23 104 kg (229 lb)  08/02/23 101 kg (222 lb 3.2 oz)  05/09/23 99.3 kg (219 lb)    Home BP readings:  NA BP Readings from Last 3 Encounters:  09/12/23 122/78  08/02/23 118/60  05/12/23 138/60    - Home HR readings:  NA Pulse Readings from Last 3 Encounters:  09/12/23 71  08/02/23 54  05/12/23 77    - - Symptoms: high/low BP/HR: none - Healthy lifestyle compliance: already noted in another HPI for this visit - Medications: medication list: reviewed; compliance: good; adverse effects: none. - -  LIPIDS 09/12/2023  - no issues - Medications: medication list: reviewed; compliance: good; adverse effects: none. - Healthy lifestyle compliance: already noted in another HPI for this visit - -   OBESITY RECHECK 09/12/2023  - wt up, situational , to have surgery - nutrition evaluation: no, ed - counting calories: no - losing weight: yes - current medications: none - desired next step: none - -   REVIEW OF SYSTEMS   GENERAL: Reports overall feeling well. Denies anorexia, weight loss, fevers/chills/sweats and other general symptoms. HEAD: Denies pain, tenderness, swelling, rashes, and other scalp problems. EYES: Denies visual changes, loss of vision, scotomata, irritation, mattering, lid changes, and pain ENT: Denies hearing difficulties, ear pain, ear canal blockage/drainage/pain, sinus pain, nose congestion/drainage/pain, throat post nasal drainage/pain/exudation, tonsil pain/exudation/mass, tongue pain/change in texture/mass, tooth pain and gum pain/bleeding. RESPIRATORY: Denies chest pain with breathing, chest congestion/fullness, cough, wheeze and shortness of breath. HEART: Denies general fatigue, exercise intolerance, chest pain/pressure,dyspnea on exertion/at night/lying flat, heart beats fast/extra/skipping/missing, fainting, weak spells and swelling. * swelling left leg recently, not better over night, no pain BREAST: Performs BSE: yes Change in size or shape: no Skin dimpling, redness, other changes: no Nipple structure change, discharge: no Breast pain: no GI: Denies swallowing pain/difficulty/obstruction, reflux, bloating, nausea/vomiting, dyspepsia, abdominal pain, change in bowel habits, loose stools, constipation, blood in the stool and black tarry bowel movements,  rectal pain/mass. NEURO: Denies headache, vertigo/dizziness/balance loss or change, vision loss/diplopia, hearing loss/tinnitus, taste loss/change, swallowing problems, muscle weakness/paralysis, extremity tremor/rigidity/abnormal movements, mental status changes/disorientation/memory loss/hallucination. * onset recently, forgets, loses train of thought PSYCH: Denies dysphoria, anhedonia, anxiety, insomnia, irritability, agitation,  pressured speech, racing thoughts, delusions and hallucinations. ENDOCRINE: Denies hyperphagia, polyuria/polydipsia, heat intolerance/sweating/weight loss, cold intolerance/weight  gain. SKIN: Denies rash/sores/pustules/nodules, easy bruising, change in moles/other skin spots.  LYMPH: Denies lymph node pain and swelling.  MS: Denies joint heat/pain/redness/swelling and spinal pain/stiffness/limitation of motion or function.  * neck pain URINARY: Denies dysuria, urgency, frequency, slow stream, hematuria, discharge, incontinence, change in urinary habits, mass and inguinal herniation. GENITAL/REPRODUCTIVE FEMALE: .  She denies pelvic pain or fullness, abnormal vaginal bleeding, vaginal discharge, vaginal pain, external lesions/ulcers/rash.   REPRODUCTIVE:   PHYSICAL EXAMINATION    Vitals:   09/12/23 1113  BP: 122/78  BP Location: Left arm  Pulse: 71  SpO2: 96%  Weight: 104 kg (229 lb)  Height: 1.702 m (5' 7)   Body mass index is 35.87 kg/m.   GENERAL: Awake, alert, oriented, cooperative and appropriately interactive and not in acute distress ; well-nourished, well developed and without the appearance of acute or chronic disease; appears stated age of 73 y.o. * Obese, discussed status, impact and basic approaches.  HEAD: Atraumatic. Nontender. Scalp without nodularity or inflammatory changes. No alopecia. NECK: Supple. Trachea midline, no deviation. No palpable adenopathy. Thyroid  normal position and size without tenderness or nodularity. No hoarseness, stridor or LT tenderness. EYES: No exopthalmos. No lid lag. EOMI, gaze conjugate, no nystagmus.  PERRLA. No lid inflammation, nodules. No conjunctival dryness, thickening, erythema or discharge. * a cataract is visible right, a lens is present: left  EARS: The pinnae are well-formed and without inflammatory changes. Hearing is grossly intact. The EAC's are without obstruction or inflammation. The TM's appear normal, with normal landmarks and light reflex and without inflammation or scarring and without findings of middle ear fluid. There is no visible cholesteoma.  NOSE: There is no significant nasal discharge or  obstruction. The septum is straight. The sinuses are non-tender.  OP: The teeth are intact and there are no caries evident or dentures are in place. There is no gum inflammation evident. The tongue is without structural changes or inflammation and protrudes in the midline.  The throat is without inflammation, cobblestoning, or post nasal drainage. There is no tonsillar enlargement, asymmetry or inflammation. The salivary glands and appendices are not enlarged or tender. BREAST: Breasts: breasts appear normal, no suspicious masses, no skin or nipple changes or axillary nodes RESP: There is no respiratory distress. There in no cyanosis or clubbing.  Auscultation reveals good exchange and equal breath sounds throughout with no rales, rhonchi, expiratory wheeze, or other adventitious sound. HEART: There is no edema. The precordium is quiet. The rhythm is regular and the rate is between 60 and 100. There is no audible murmur, gallop, rub or other extraneous sound. S1 is normal and S2 is physiologically split. S3 and S4 are not heard. VASC: Bilateral lower extremities are without significant varicosities, sequelae of chronic venous stasis disease, acute DVT change, or stasis edema. Peripheral pulses are 2/4 and there is good distal capillary perfusion. Central pulses are 2/4 and are without bruits. * mild NP edema BLE to knees, no inflam changes, nontender, H neg, discussed and deferred doppler GI/ABD: The patient is anicteric and there is no pallor. The abdomen is soft, nondistended, nontender and without palpable mass or HSM. Bowel sounds are normal.  There is no umbilical hernia or inflammation. There is no cutanous vascular engorgement. PSYCH: The patient is AAO and appropriately interactive. There is no thought disorder or hallucination evident. Speech is fluent and germane. Mood and affect are normal and congruent. NEURO:  AAO and appropriately interactive. Hearing is grossly intact. Face is symmetric,  EOMI, PERRL.  Gait is stable. Strength and touch sensation are grossly intact without overt localized or lateralized finding. Tremor is not present. * memory impaired, has to ask daughter for answers MS: Gait normal and stable. No use of cane or walker.  Joints FROM, stable and without structural or inflammatory changes. Spine is straight with good posture, is nontender and has good mobility.  Muscles have normal tone and no atrophy, spasm or nodularity. * decreased ROM Cspine and LSS, hips DERM: Anicteric. No pallor. Exposed skin does not exhibit abnormal bruising, petechiae, pigmentation changes, dermatographism, hives, rashes, abnormal moles or other lesions. There is no significant actinic change.  Hair distribution is normal. LYMPHATIC: No palpable adenopathy neck, supraclavicular, axillae, groins, elbows/upper arms. - - Diabetic foot examination - The feet exhibit normal structure and mobility. The skin is without abnormal callous, ulceration, or rash. Circulation is good, feet are warm with good distal capillary refill, and DP/PT pulses are palpable and equal.  Sensation it intact to touch and MFT.  Foot care education is provided. - -   TESTING   Results for orders placed or performed in visit on 09/12/23  POC Glucose (Hemocue/NOVA)  Result Value Ref Range   Glucose, POC 111 70 - 125 mg/dL   Kit/Device Lot # [id number]    Kit/Device Expiration Date 10/23/2024   POC Hemoglobin A1c  Result Value Ref Range   Hemoglobin A1c (POC) 6.5 (A) 4.2 - 5.6 %   A1C Information      A1C Diagnostic Criteria: Prediabetes:5.7% - 6.4% Diabetes:>=6.5% A1C Glycemic Goals: Older Adults: <7.0% Children and Adolescents: <7.0% Pregnant Women: <6.0%   Kit/Device Lot # [id number]    Kit/Device Expiration Date 05/08/2025      The following portions of the patient's history were reviewed and updated as appropriate: allergies, current mediations, past medial history, past surgical history, past family history,  past social history, and problem list.   ALLERGIES   Codeine, Dapagliflozin, and Latex  MEDICATIONS   Medications Ordered Prior to Encounter[1]  HISTORIES   Patient Active Problem List   Diagnosis Date Noted  . Stage 3a chronic kidney disease (CMD) 09/10/2023  . Type 2 diabetes mellitus with stage 3a chronic kidney disease, with long-term current use of insulin     (CMD) 09/10/2023  . Thyroid  nodule 08/05/2023  . Cervical stenosis of spinal canal (NSURG) 08/05/2023  . Cerebrovascular accident (CVA)    (CMD) (NEURO) 08/05/2023  . Cold hands 03/28/2023  . Primary osteoarthritis of right hand 03/13/2023  . Bleeding internal hemorrhoids 04/13/2022  . Preoperative evaluation to rule out surgical contraindication 03/07/2022  . Osteomyelitis of right foot (HCC) 03/07/2022  . Diabetic foot ulcer (HCC) 01/18/2022  . Nausea 11/03/2021  . Type 2 diabetes mellitus with diabetic neuropathy, with long-term current use of insulin  (HCC) 03/23/2021  . Cellulitis of left ankle 03/16/2021  . Venous stasis ulcer of left lower leg with edema of left lower leg (HCC) 03/02/2021  . Pre-ulcerative corn or callous 09/09/2020  . Breast calcifications 06/09/2020  . Wellness examination 12/31/2019  . Encounter for screening mammogram for malignant neoplasm of breast 12/31/2019  . Screening for malignant neoplasm of cervix, discontinued 12/31/2019  . Screening for colon cancer 12/31/2019  . PAD (peripheral artery disease) 10/24/2019  . Type 2 diabetes mellitus with diabetic peripheral angiopathy without gangrene, with long-term current use of insulin     (CMD) 10/15/2019  . Diminished pulses in lower extremity 10/15/2019  . Acute pain of left foot 10/15/2019  . Pain due to onychomycosis of toenails of both feet 02/12/2019  . History of diabetic ulcer of foot 02/12/2019  . Scabies 08/29/2017  . Obesity (BMI 30-39.9) 07/20/2017  . History of depression 12/08/2016  . Chronic pain of right knee 09/15/2016   . Goiter 05/24/2016  . Arthritis of foot 04/24/2016  . Paresthesia of both feet 04/24/2016  . Obstructive sleep apnea 11/16/2015  . Type 2 diabetes mellitus with ophthalmic complication, with long-term current use of insulin  (HCC) 11/16/2015  . Comprehensive diabetic foot examination, type 2 DM, encounter for (HCC) 11/16/2015  . Sickle cell trait (HCC) 11/16/2015  . Benign hypertension 05/04/2015  . Mixed hyperlipidemia 05/04/2015    Medical History[2]  Surgical History[3]  Social History[4]  Family History[5]  ENCOUNTER PROVIDER  This document serves as a record of services personally performed by Dr Lamar LITTIE Ofilia Mickey MD.  It was created on their behalf by Joen Macario Reeve, CMA, a trained medical scribe, and Certified Medical  Assistant (CMA). During the course of documenting the history, physical exam and medical decision making, I was functioning as a Stage manager. The creation of this record is the provider's dictation and/or activities during the visit.  Electronically signed by Joen Macario Reeve, CMA 09/04/2023 11:51 AM  This document serves as a record of services personally performed by Dr Lamar LITTIE Ofilia Mickey MD.  It was created on their behalf by Corean Krone, CMA, a trained medical scribe, and Certified Medical Assistant (CMA). During the course of documenting the history, physical exam and medical decision making, I was functioning as a Stage manager. The creation of this record is the provider's dictation and/or activities during the visit.  Electronically signed by: Corean Krone, CMA 09/12/2023 11:17 AM     Lamar LITTIE Ofilia, MD  I agree the documentation is accurate and complete.  Electronically signed by: Lamar LITTIE Ofilia, MD 09/12/2023 11:45 AM      [facility]  - [facility] [place] Medicare Wellness Visit Type:: Subsequent Annual Wellness Visit  Name: Carla Stokes Date of Birth: [birth year] Age: 73 y.o. MRN:  [id number] Visit Date: 09/12/2023  History obtained from: patient  Living Arrangements/Support System/Health Assessment/Pain/Stress Marital status: divorced Number of children: 2 Occupation: retired Living arrangements: (!) lives alone Does the patient have a support system (family, friend, church, Conservation officer, nature, etc)?: Yes   Do you have any dental concerns?: No In the past month, have you experienced a change in your bladder control?: No   Do you have any difficulty obtaining your medications?: No   Do you have trouble consistently taking or remembering to take all of your medications as prescribed?: No Patient rates overall stress level as: None Does stress affect daily life?: No Typical amount of pain: none Does pain affect daily life?: No Are you currently prescribed opioids?: (!) Yes Do you ever use more of your medication, that is, take a higher dose, than is prescribed for you?: No Do you ever use your medication more often, that is, shorten the time between doses, than is prescribed for you?: No Do you ever need early refills for your pain medication?: No Do you ever feel high or get a buzz after using your pain medication?: No Do you ever take your pain medication because you are upset, using the medication to relieve or cope with problems other than pain?: No Have you ever gone to multiple physicians, including emergency room doctors, seeking more of your pain medication?: No OUD Score: a score of 2 or more indicates a positive screen and a possible diagnosis of OUD: 0  Depression Screening  Behavioral Health Screening  Patient Health Questionnaire-2 Score: 0 (09/12/2023 11:02 AM)  Patient Health Questionnaire-9 Score: 0 (09/12/2023 11:02 AM)   PHQ Question # 9 Thoughts that you would be better off dead or hurting yourself in some way: Not at all   Patient's Depression screening/score = Negative    {Depression Eojw:[id number]}    Social History (Tobacco/Drugs/Sexual  Activity) Ciji reports that she has never smoked. She has never used smokeless tobacco. Tobacco Use?: No How many times in the past year have you used a recreational drug or used a prescription medication for nonmedical reasons?: None Risk factors for sexually transmitted infections (i.e., multiple sexual partners): No Are you bothered by sexual problems?: No  Alcohol Screening How often do you have a drink containing alcohol?: Never How many standard drinks containing alcohol do you have on a typical day?: Never,  1 or 2 drinks How often do you have six or more drinks on one occasion?: Never Audit-C Score: 0  Physical Activity Regular exercise?: Yes Exercise frequency (times per week): 5 Exercise intensity: light (like slow walking)  Diet How many meals a day?: 2 Eats fruit and vegetables daily?: Yes Most meals are obtained by: preparing their own meals  Home and Transportation Safety All rugs have non-skid backing?: N/A, no rugs All stairs or steps have railings?: N/A, no stairs Grab bars in the bathtub or shower?: Yes Have non-skid surface in bathtub or shower?: Yes Good home lighting?: Yes Regular seat belt use?: Yes      Activities of Daily Living Feed self?: Yes Bathe self?: Yes Dress self?: Yes Use toilet without assistance?: Yes Walk without assistance?: Yes    Instrumental Activities of Daily Living Manage finances?: Yes Shop for themselves?: Yes Prepare meals?: Yes Use the telephone?: Yes Manage medications?: Yes   Performs basic housework/laundry?: Yes Drives?: Yes Primary transportation is: driving, family or friends  Hearing Concerns about hearing?: No Uses hearing aids?: No Hear whispered voice? (Observed): Yes  Vision Concerns about vision?: No Vision exam performed?: (!) No  Fall Risk Is the patient ambulatory?: Yes One or more falls in the last year:: No Feels unsteady when walking:: No  Cognitive Assessment Has a diagnosis of  dementia or cognitive impairment?: No Are there any memory concerns by the patient, others, or providers?: No              Advance Directives Living will?: Yes Advance directive information provided to patient: Yes Healthcare POA?: Yes Name of Health Care Agent: brian, stephanie, Erica Relationship to patient: Adult child Health Care Agent's phone number: [phone], [phone], [phone]         Other History I reviewed and updated the following risk factors and conditions as appropriate: Reviewed/Updated: Problem List, Medical History, Surgical History, Family History, Medications, Allergies Reviewed/Updated: Vital Signs (height, weight, and BP), Immunizations, Health Maintenance Patient Care Team Updated: Done  Vital Signs BP 122/78 (BP Location: Left arm)   Pulse 71   Ht 1.702 m (5' 7)   Wt 104 kg (229 lb)   SpO2 96%   BMI 35.87 kg/m   Screening and Immunizations Health Maintenance Status      Date Due Completion Dates   Bone Density Scan (Ongoing) Never done ---   Bone Density Scan Never done ---   Diabetes: Foot Exam 10/06/2022 10/05/2021 (Done), 06/16/2021 (Done)   Comment on 10/05/2021: See Legacy System   Comment on 06/16/2021: See Legacy System   Comment on 09/08/2020: See Legacy System   Comment on 03/10/2020: See Legacy System   Comment on 05/14/2019: See Legacy System   COVID-19 Vaccine (6 - 2024-25 season) 05/04/2023 11/04/2022, 11/04/2022   Diabetes:  eGFR for Kidney Evaluation 08/03/2023 08/03/2022, 06/16/2021   Influenza Vaccine (1) 09/29/2023 11/04/2022, 12/08/2020   Diabetes: Retinopathy Screening Combo 11/23/2023 11/22/2022, 07/27/2021   Diabetes:  Quantitative uACR for Kidney Evaluation 02/28/2024 02/28/2023   Breast Cancer Screening (Mammogram) 05/16/2024 05/17/2023, 04/19/2022   Comprehensive Annual Visit 09/11/2024 09/12/2023, 08/03/2022   Medicare Annual Wellness (AWV) Subsequent Visits 09/11/2024 09/12/2023, 08/03/2022   Diabetes: Hemoglobin A1C 09/11/2024  09/12/2023, 09/12/2023   Depression Screening 09/11/2024 09/12/2023   Colorectal Cancer Screening 01/05/2026 01/05/2021, 01/05/2021   DTaP/Tdap/Td Vaccines (2 - Td or Tdap) 04/25/2033 04/26/2023       Immunization History  Administered Date(s) Administered  . Covid-19 Vaccine Unspecified 01/14/2022, 11/04/2022  . Covid-19, Mrna,  Lnp-s, Pf, Tris-sucrose, 30 Mcg/0.3 Ml 01/14/2022, 11/04/2022  . Influenza, High-dose Seasonal, Quadrivalent, Preservative Free 12/18/2018, 12/10/2019, 12/08/2020  . Influenza, Recombinant, Quadrivalent, Injectable, Preservative Free (Egg Free) 12/08/2016  . Influenza, Unspecified 12/22/2017  . Influenza, high-dose, trivalent, PF 11/04/2022  . Moderna SARS-CoV-2 Primary Series 12+ yrs 04/15/2019, 05/13/2019, 02/18/2020  . Pneumococcal Conjugate 13-Valent 12/18/2018  . Pneumococcal Polysaccharide Vaccine, 23 Valent (PNEUMOVAX-23) 2Y+ 06/16/2021  . RSV, PF (AREXVY) 60Y+ 03/14/2023  . TDAP VACCINE (BOOSTRIX,ADACEL) 7Y+ 04/26/2023  . Varicella SQ (VARIVAX) 1Y+ 02/28/2013  . Varicella Zoster [facility]) 18Y+ 04/26/2023    Assessment/Plan: Subsequent Annual Wellness Visit: The topics above were reviewed with the patient.  Healthy lifestyle principles reviewed.  Recommendations provided when indicated.  Follow up 1 year for next wellness visit.  No orders of the defined types were placed in this encounter.   New Medications Ordered This Visit  Medications  . cyclobenzaprine (FLEXERIL) 5 mg tablet    Sig: take 1 tablet by oral route every 8 hours as needed for muscle spasms.  . insulin  glargine U-300 (Toujeo  SoloStar U-300 Insulin ) 300 unit/mL (1.5 mL) pen pen    Sig: Inject 80 Units under the skin daily. diabetes    Patient Care Team: Lamar LITTIE Hobby, MD as PCP - General (Family Medicine) Lamar LITTIE Hobby, MD as PCP - Attributed Ulanda Almarie Ober, MD as Attending Provider (Vascular Surgery) Thresa Theodoro Sar, [place] (Podiatry) Lamar Bucks, MD as Referring  Physician (Infectious Diseases) Duwaine Perri Russell, NP as Referring Physician (Neurology) [name], MD as Referring Physician (Neurosurgery)  Electronically signed by: Lamar LITTIE Hobby, MD 09/12/2023 11:48 AM     [1] Current Outpatient Medications on File Prior to Visit  Medication Sig Dispense Refill  . cyclobenzaprine (FLEXERIL) 5 mg tablet take 1 tablet by oral route every 8 hours as needed for muscle spasms.    . Abrysvo, PF, 120 mcg/0.5 mL solr injection     . Accu-Chek Aviva Control Soln soln     . amLODIPine  (NORVASC ) 5 mg tablet TAKE 1 TABLET BY MOUTH IN THE MORNING FOR HIGH BLOOD PRESSURE 90 tablet 3  . aspirin  81 mg EC tablet TAKE 1 ORALLY ONCE DAILY TO HELP PREVENT HEART ATTACK    . BD Alcohol Swabs padm     . BD Ultra-Fine Short Pen Needle 31 gauge x 5/16 ndle USE 2 NEEDLES PER DAY 200 each 3  . blood-glucose meter misc 1 Units by miscellaneous route 3 (three) times a day. 300 each 3  . gabapentin  (NEURONTIN ) 100 mg capsule Take 100 mg by mouth 3 (three) times a day. Take 2 tablets ( 200 mg) tid    . glucose blood (Accu-Chek Aviva Plus test strp) test strip TEST BLOOD SUGAR THREE TIMES DAILY 300 strip 3  . insulin  glargine U-300 (Toujeo  SoloStar U-300 Insulin ) 300 unit/mL (1.5 mL) pen pen Inject 60 Units under the skin daily. diabetes    . Lancets (Accu-Chek Softclix Lancets) misc TEST BLOOD SUGAR THREE TIMES DAILY 300 each 3  . lancing device with lancets kit 1 Units by miscellaneous route every 6 (six) months. 1 each 11  . lidocaine  (LIDODERM ) 5 % patch Apply 2 patches topically daily. Remove & discard patch within 12 hours or as directed by MD. 1 each 3  . multivitamine, geriatric, (Multivitamin 50 Plus) tab Take 1 tablet by mouth daily.    . oxyCODONE -acetaminophen  (PERCOCET) 5-325 mg per tablet Take 1 tablet by mouth every 6 (six) hours as needed for moderate  pain (4-6).    . predniSONE  (DELTASONE ) 20 mg tablet Take 20 mg by mouth daily. Take 2 tablets ( 40 mg)  every day x 2 days    . rosuvastatin  (CRESTOR ) 20 mg tablet TAKE 1 TABLET BY MOUTH DAILY. FOR CHOLESTEROL 90 tablet 3  . semaglutide (Ozempic) 2 mg/dose (8 mg/3 mL) subcutaneous pen injector INJECT 0.75 ML (2 MG TOTAL) UNDER THE SKIN EVERY 7 DAYS. DIABETES 6 mL 3  . valsartan -hydroCHLOROthiazide  320-25 mg tab TAKE 1 TABLET EVERY MORNING FOR HIGH BLOOD PRESSURE 90 tablet 3  . zinc gluconate 50 mg tab tablet Take 50 mg by mouth Once Daily.    . [DISCONTINUED] cyclobenzaprine (FLEXERIL) 10 mg tablet Take 10 mg by mouth 2 (two) times a day as needed for muscle spasms.    . [DISCONTINUED] methocarbamoL  (ROBAXIN ) 750 mg tablet Take 750 mg by mouth every 8 (eight) hours as needed for muscle spasms.    . [DISCONTINUED] tiZANidine (ZANAFLEX) 4 mg tablet Take 4 mg by mouth 2 (two) times a day. Up to 10 days.     No current facility-administered medications on file prior to visit.  [2] Past Medical History: Diagnosis Date  . Arthritis   . Diabetes mellitus    (CMD)   . Gout   . Hypercholesterolemia   . Type 2 diabetes mellitus    (CMD)   [3] Past Surgical History: Procedure Laterality Date  . CATARACT EXTRACTION EXTRACAPSULAR W/ INTRAOCULAR LENS IMPLANTATION Left 2021   Procedure: CATARACT EXTRACTION EXTRACAPSULAR W/ INTRAOCULAR LENS IMPLANTATION  . CESAREAN SECTION, UNSPECIFIED     Procedure: CESAREAN SECTION; x2  . TAH-BSO (TOTAL ABDOMINAL HYSTERECTOMY W/ BILATERAL SALPINGOOPHORECTOMY  1997   Procedure: TOTAL ABDOMINAL HYSTERECTOMY W/ BILATERAL SALPINGOOPHORECTOMY; fibroids, bleeding not cancer  . WISDOM TOOTH EXTRACTION     Procedure: WISDOM TOOTH EXTRACTION  [4] Social History Tobacco Use  . Smoking status: Never  . Smokeless tobacco: Never  Substance Use Topics  . Alcohol use: Yes  . Drug use: No  [5] Family History Problem Relation Name Age of Onset  . Heart disease Mother         pacemaker  . Hyperlipidemia Mother    . Hypertension Mother    . Diabetes Father    . Colon  cancer Sister  76  . Breast cancer Neg Hx    . Coronary artery disease Neg Hx         premature

## 2023-09-06 ENCOUNTER — Encounter: Payer: Self-pay | Admitting: Adult Health

## 2023-09-06 ENCOUNTER — Ambulatory Visit: Admitting: Adult Health

## 2023-09-06 VITALS — BP 127/59 | HR 50 | Ht 67.0 in | Wt 228.0 lb

## 2023-09-06 DIAGNOSIS — I10 Essential (primary) hypertension: Secondary | ICD-10-CM

## 2023-09-06 DIAGNOSIS — E785 Hyperlipidemia, unspecified: Secondary | ICD-10-CM | POA: Diagnosis not present

## 2023-09-06 DIAGNOSIS — I639 Cerebral infarction, unspecified: Secondary | ICD-10-CM

## 2023-09-06 NOTE — Progress Notes (Signed)
 PATIENT: Carla Stokes DOB: [birth year]  REASON FOR VISIT: follow up HISTORY FROM: patient PRIMARY NEUROLOGIST: Dr. Rosemarie  Chief Complaint  Patient presents with   Hospitalization Follow-up    RM6- w/ daughter Pt stated--right side of the neck is painful and having surgery 7/24.     HISTORY OF PRESENT ILLNESS: Today 09/06/23   Carla Stokes is a 73 y.o. 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 returns today for follow-up.  Patient was initially taken to the hospital for neck pain radiating to her back and being off balance.  They did find that she had spinal cord compression and she has followed with neurosurgery and will be having surgery on July 24.  The stroke was an incidental finding.  Patient does report having any strokelike symptoms.  She is currently on aspirin .  This will have to be stopped 1 week prior to her surgery.  She remains on Crestor  for her cholesterol.  Hemoglobin A1c is at 7.  She states that her blood sugars at home have been running in normal range.  Her CTA scan in the hospital did pick up a thyroid  nodule.  This was followed up with a thyroid  ultrasound and fine-needle aspiration was recommended.  The patient has an appointment with her primary care next week.  Patient also notes some trouble with her memory.  She states that she often gets mixed up.  Her daughter does state that she has had a lot going on in the last month.  She is also on pain medication for neck pain.  Daughter states that prior to this she was very active.  She returns today for an evaluation.  Imaging:   MRI wo Contrast:  IMPRESSION: 1. 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 No acute intracranial abnormality. 2. Severe spinal canal stenosis with compression of the spinal cord at C2-3, C3-4, C4-5 and C5-6. 3. Severe bilateral neural foraminal stenosis at  C3-4, C4-5 and C5-6. 4. Severe left C6-7 neural foraminal stenosis.   CTA head/neck:  IMPRESSION: No large vessel occlusion.   No CT evidence of acute intracranial abnormality.   No significant atherosclerosis of the arteries in the neck.   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   Atherosclerosis involving the left vertebral artery at the V3/V4 junction and proximal V4 segment resulting in mild stenosis.   Significant degenerative changes of the cervical spine along with ossification of the posterior longitudinal ligament. Significant osseous spinal canal narrowing at multiple levels. Additional significant osseous foraminal narrowing. Recommend correlation with MRI cervical spine.   Right thyroid  nodule measuring up to 3.3 cm. Recommend nonemergent correlation with thyroid  ultrasound if not previously performed.    HISTORY (copied from Hospital):   Ms. Armina Galloway is a 73 y.o. female with history of diabetes, hypertension, hyperlipidemia, obesity, DJD admitted for neck pain radiating to back, off balance. No TNK given due to outside window.    Spinal cord compression Likely chronic, patient no significant extremity weakness, no sensory level, only has bilateral lower extremity heel-to-shin ataxic MRI C-spine showed spinal cord compression with severe bilateral neuroforaminal stenosis Neurosurgery Dr. 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 CT head and neck  unremarkable MRI right CR small subacute infarct 2D Echo EF 60 to 65% LDL 49 HgbA1c pending UDS positive for opiates, likely medication related SCD's for VTE prophylaxis aspirin  81 mg daily prior to admission, now on aspirin  81 mg daily.  No DAPT  given subacute nature of stroke and potential neurosurgical intervention Ongoing aggressive stroke risk factor management Therapy recommendations: Home health PT Disposition: Pending   Diabetes HgbA1c pending goal < 7.0 Uncontrolled Currently on insulin  Hyperglycemia in the setting of steroids use CBG monitoring SSI DM education and close PCP follow up   Hypertension Stable Long term BP goal normotensive   Hyperlipidemia Home meds: Crestor  20 LDL 49, goal < 70 Now on home Crestor  20 Continue statin at discharge   Other Stroke Risk Factors Advanced age Obesity, Body mass index is 34.3 kg/m.    Other Active Problems DJD  REVIEW OF SYSTEMS: Out of a complete 14 system review of symptoms, the patient complains only of the following symptoms, and all other reviewed systems are negative.  ALLERGIES: Allergies  Allergen Reactions   Codeine     Other Reaction(s): Dizziness (intolerance)   Dapagliflozin Other (See Comments)    2017: yeast infection   Latex Itching    HOME MEDICATIONS: Outpatient Medications Prior to Visit  Medication Sig Dispense Refill   acetaminophen  (TYLENOL ) 500 MG tablet Take 500-1,000 mg by mouth every 6 (six) hours as needed for moderate pain.     Alcohol Swabs (B-D SINGLE USE SWABS REGULAR) PADS      amLODipine  (NORVASC ) 5 MG tablet Take 5 mg by mouth daily.     cyclobenzaprine (FLEXERIL) 5 MG tablet Take 1 tablet by oral route every 8 hours as needed for muscle spasms.     gabapentin  (NEURONTIN ) 100 MG capsule Take 2 capsules (200 mg total) by mouth 3 (three) times daily. 180 capsule 0   insulin  glargine, 1 Unit Dial , (TOUJEO  SOLOSTAR) 300 UNIT/ML Solostar Pen Inject 80 Units into the skin at bedtime.     lidocaine  4 % Place 1 patch onto the skin daily. Remove & Discard patch within 12 hours or as directed by MD 6 patch 0   Multiple Vitamin (MULTIVITAMIN WITH MINERALS) TABS tablet Take 1 tablet by mouth daily.     oxyCODONE -acetaminophen   (PERCOCET/ROXICET) 5-325 MG tablet Take 1 tablet by mouth.     zinc gluconate 50 MG tablet Take 50 mg by mouth daily.     aspirin  EC 81 MG tablet Take 81 mg by mouth daily. Swallow whole. (Patient not taking: Reported on 09/06/2023)     OZEMPIC, 2 MG/DOSE, 8 MG/3ML SOPN Inject 2 mg into the skin every Wednesday. (Patient not taking: Reported on 09/06/2023)     rosuvastatin  (CRESTOR ) 20 MG tablet Take 20 mg by mouth daily. (Patient not taking: Reported on 09/06/2023)     No facility-administered medications prior to visit.    PAST MEDICAL HISTORY: Past Medical History:  Diagnosis Date   Arthritis    Diabetes mellitus without complication (HCC)    High cholesterol    Hypertension     PAST SURGICAL HISTORY: Past Surgical History:  Procedure Laterality Date   ABDOMINAL AORTOGRAM W/LOWER EXTREMITY N/A 02/03/2022   Procedure: ABDOMINAL AORTOGRAM W/LOWER EXTREMITY;  Surgeon: Gretta Lonni PARAS, MD;  Location: [facility];  Service: Cardiovascular;  Laterality: N/A;   ABDOMINAL HYSTERECTOMY     BONE BIOPSY Right 03/10/2022   Procedure: BONE BIOPSY;  Surgeon: [name], [name];  Location: [facility];  Service: Podiatry;  Laterality: Right;   WOUND DEBRIDEMENT Right 03/10/2022   Procedure: DEBRIDEMENT WOUND/ULCER;  Surgeon: [name], [name];  Location: [facility];  Service: Podiatry;  Laterality: Right;    FAMILY HISTORY: History reviewed. No pertinent family history.  SOCIAL HISTORY: Social History   Socioeconomic History   Marital status: Divorced    Spouse name: Not on file   Number of children: Not on file   Years of education: Not on file   Highest education level: Not on file  Occupational History   Not on file  Tobacco Use   Smoking status: Never    Passive exposure: Never   Smokeless tobacco: Never  Vaping Use   Vaping status: Never Used  Substance and Sexual Activity   Alcohol use: Not Currently   Drug use: Never   Sexual activity: Not Currently  Other Topics Concern    Not on file  Social History Narrative   Not on file   Social Drivers of Health   Financial Resource Strain: Not on file  Food Insecurity: No Food Insecurity (08/06/2023)   Hunger Vital Sign    Worried About Running Out of Food in the Last Year: Never true    Ran Out of Food in the Last Year: Never true  Transportation Needs: No Transportation Needs (08/06/2023)   PRAPARE - Administrator, Civil Service (Medical): No    Lack of Transportation (Non-Medical): No  Physical Activity: Not on file  Stress: Not on file  Social Connections: Moderately Integrated (08/06/2023)   Social Connection and Isolation Panel    Frequency of Communication with Friends and Family: More than three times a week    Frequency of Social Gatherings with Friends and Family: More than three times a week    Attends Religious Services: More than 4 times per year    Active Member of Clubs or Organizations: Yes    Attends Banker Meetings: More than 4 times per year    Marital Status: Divorced  Intimate Partner Violence: Not At Risk (08/06/2023)   Humiliation, Afraid, Rape, and Kick questionnaire    Fear of Current or Ex-Partner: No    Emotionally Abused: No    Physically Abused: No    Sexually Abused: No      PHYSICAL EXAM  Vitals:   09/06/23 0859 09/06/23 0902  BP: (!) 121/58 (!) 127/59  Pulse: (!) 50   Weight: 228 lb (103.4 kg)   Height: 5' 7 (1.702 m)    Body mass index is 35.71 kg/m.     09/06/2023    9:45 AM  MMSE - Mini Mental State Exam  Orientation to time 4  Orientation to Place 4  Registration 3  Attention/ Calculation 0  Recall 1  Language- name 2 objects 2  Language- repeat 1  Language- follow 3 step command 0  Language- read & follow direction 1  Write a sentence 1  Copy design 0  Total score 17    Generalized: Well developed, in no acute distress   Neurological examination  Mentation: Alert oriented to time, place, history taking. Follows all  commands speech and language fluent Cranial nerve II-XII: Pupils were equal round reactive to light. Extraocular movements were full, visual field were full on confrontational test. Facial sensation and strength were normal. Uvula tongue midline. Head turning and shoulder shrug  were normal and symmetric. Motor: The motor testing reveals 5 over 5 strength of all 4 extremities. Good symmetric motor tone  is noted throughout.  Sensory: Sensory testing is intact to soft touch on all 4 extremities. No evidence of extinction is noted.  Coordination: Cerebellar testing reveals good finger-nose-finger and heel-to-shin bilaterally.  Gait and station: Uses a walker when ambulating Reflexes: Deep tendon reflexes are symmetric and normal bilaterally.   DIAGNOSTIC DATA (LABS, IMAGING, TESTING) - I reviewed patient records, labs, notes, testing and imaging myself where available.  Lab Results  Component Value Date   WBC 8.7 08/09/2023   HGB 13.3 08/09/2023   HCT 38.2 08/09/2023   MCV 79.4 (L) 08/09/2023   PLT 222 08/09/2023      Component Value Date/Time   NA 140 08/09/2023 0445   K 3.9 08/09/2023 0445   CL 108 08/09/2023 0445   CO2 26 08/09/2023 0445   GLUCOSE 151 (H) 08/09/2023 0445   BUN 20 08/09/2023 0445   CREATININE 1.05 (H) 08/09/2023 0445   CREATININE 0.98 04/27/2022 1049   CALCIUM  9.4 08/09/2023 0445   PROT 7.1 08/04/2023 1629   ALBUMIN 4.2 08/04/2023 1629   AST 34 08/04/2023 1629   ALT 37 08/04/2023 1629   ALKPHOS 54 08/04/2023 1629   BILITOT 0.5 08/04/2023 1629   GFRNONAA 57 (L) 08/09/2023 0445   Lab Results  Component Value Date   CHOL 106 08/05/2023   HDL 48 08/05/2023   LDLCALC 49 08/05/2023   TRIG 45 08/05/2023   CHOLHDL 2.2 08/05/2023   Lab Results  Component Value Date   HGBA1C 7.0 (H) 08/05/2023   No results found for: VITAMINB12 Lab Results  Component Value Date   TSH 0.517 08/05/2023      ASSESSMENT AND PLAN 73 y.o. year old female  has a past  medical history of Arthritis, Diabetes mellitus without complication (HCC), High cholesterol, and Hypertension. 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   Discussed secondary stroke prevention measures and importance of close PCP follow up for aggressive stroke risk factor management. I have gone over the pathophysiology of stroke, warning signs and symptoms, risk factors and their management in some detail with instructions to go to the closest emergency room for symptoms of concern. HTN: BP goal <130/90.   HLD: LDL goal <70. Recent LDL 49.  DMII: A1c goal<7.0. Recent A1c 7.0.  MMSE 17/30- wil recheck at the next visit.  Did advise that if symptoms worsen or she develops new symptoms we can see her back sooner. Encouraged patient to monitor diet and encouraged exercise Encouraged patient to keep follow-up next week with her PCP to discuss thyroid  nodule and neck steps. FU with our office 6 to 8 months or sooner if needed      Duwaine Russell, MSN, NP-C 09/06/2023, 9:04 AM [facility] [address] [place], [place] [address] [phone]

## 2023-09-06 NOTE — Patient Instructions (Signed)
 Your Plan:  Continue aspirin  until you have to stop for surgery   Blood pressure goal <130/90 Cholesterol LDL goal <70 Diabetes goal A1c <7 Monitor diet and try to exercise  Follow-up with PCP about thyroid  Nodule  Thank you for coming to see us  at [facility]. I hope we have been able to provide you high quality care today.  You may receive a patient satisfaction survey over the next few weeks. We would appreciate your feedback and comments so that we may continue to improve ourselves and the health of our patients.

## 2023-09-12 NOTE — Progress Notes (Signed)
 Surgical Instructions   Your procedure is scheduled on Thursday, July 24th, 2025. Report to [facility] Main Entrance A at 5:30 A.M., then check in with the Admitting office. Any questions or running late day of surgery: call [phone]  Questions prior to your surgery date: call [phone], Monday-Friday, 8am-4pm. If you experience any cold or flu symptoms such as cough, fever, chills, shortness of breath, etc. between now and your scheduled surgery, please notify us  at the above number.     Remember:  Do not eat or drink after midnight the night before your surgery    Take these medicines the morning of surgery with A SIP OF WATER: Amlodipine  (Norvasc ) Gabapentin  (Neurontin )   May take these medicines IF NEEDED: Acetaminophen  (Tylenol ) Cyclobenzaprine (Flexeril) Oxycodone -acetaminophen  (Percocet)    One week prior to surgery, STOP taking any Aspirin  (unless otherwise instructed by your surgeon) Aleve , Naproxen , Ibuprofen, Motrin, Advil, Goody's, BC's, all herbal medications, fish oil, and non-prescription vitamins.   WHAT DO I DO ABOUT MY DIABETES MEDICATION?   Ozempic needs to be stopped 7 days prior to your procedure.  Your last dose should be on or before Wednesday, July 16th.    THE NIGHT BEFORE SURGERY, take _40_ units of _Insulin Glargine (Toujeo )_insulin.        HOW TO MANAGE YOUR DIABETES BEFORE AND AFTER SURGERY  Why is it important to control my blood sugar before and after surgery? Improving blood sugar levels before and after surgery helps healing and can limit problems. A way of improving blood sugar control is eating a healthy diet by:  Eating less sugar and carbohydrates  Increasing activity/exercise  Talking with your doctor about reaching your blood sugar goals High blood sugars (greater than 180 mg/dL) can raise your risk of infections and slow your recovery, so you will need to focus on controlling your diabetes during the weeks before  surgery. Make sure that the doctor who takes care of your diabetes knows about your planned surgery including the date and location.  How do I manage my blood sugar before surgery? Check your blood sugar at least 4 times a day, starting 2 days before surgery, to make sure that the level is not too high or low.  Check your blood sugar the morning of your surgery when you wake up and every 2 hours until you get to the Short Stay unit.  If your blood sugar is less than 70 mg/dL, you will need to treat for low blood sugar: Do not take insulin . Treat a low blood sugar (less than 70 mg/dL) with  cup of clear juice (cranberry or apple), 4 glucose tablets, OR glucose gel. Recheck blood sugar in 15 minutes after treatment (to make sure it is greater than 70 mg/dL). If your blood sugar is not greater than 70 mg/dL on recheck, call [phone] for further instructions. Report your blood sugar to the short stay nurse when you get to Short Stay.  If you are admitted to the hospital after surgery: Your blood sugar will be checked by the staff and you will probably be given insulin  after surgery (instead of oral diabetes medicines) to make sure you have good blood sugar levels. The goal for blood sugar control after surgery is 80-180 mg/dL.                      Do NOT Smoke (Tobacco/Vaping) for 24 hours prior to your procedure.  If you use a CPAP at night, you may bring  your mask/headgear for your overnight stay.   You will be asked to remove any contacts, glasses, piercing's, hearing aid's, dentures/partials prior to surgery. Please bring cases for these items if needed.    Patients discharged the day of surgery will not be allowed to drive home, and someone needs to stay with them for 24 hours.  SURGICAL WAITING ROOM VISITATION Patients may have no more than 2 support people in the waiting area - these visitors may rotate.   Pre-op nurse will coordinate an appropriate time for 1 ADULT support  person, who may not rotate, to accompany patient in pre-op.  Children under the age of 63 must have an adult with them who is not the patient and must remain in the main waiting area with an adult.  If the patient needs to stay at the hospital during part of their recovery, the visitor guidelines for inpatient rooms apply.  Please refer to the [facility] website for the visitor guidelines for any additional information.   If you received a COVID test during your pre-op visit  it is requested that you wear a mask when out in public, stay away from anyone that may not be feeling well and notify your surgeon if you develop symptoms. If you have been in contact with anyone that has tested positive in the last 10 days please notify you surgeon.      Pre-operative 5 CHG Bathing Instructions   You can play a key role in reducing the risk of infection after surgery. Your skin needs to be as free of germs as possible. You can reduce the number of germs on your skin by washing with CHG (chlorhexidine  gluconate) soap before surgery. CHG is an antiseptic soap that kills germs and continues to kill germs even after washing.   DO NOT use if you have an allergy to chlorhexidine /CHG or antibacterial soaps. If your skin becomes reddened or irritated, stop using the CHG and notify one of our RNs at [phone].   Please shower with the CHG soap starting 4 days before surgery using the following schedule:     Please keep in mind the following:  DO NOT shave, including legs and underarms, starting the day of your first shower.   You may shave your face at any point before/day of surgery.  Place clean sheets on your bed the day you start using CHG soap. Use a clean washcloth (not used since being washed) for each shower. DO NOT sleep with pets once you start using the CHG.   CHG Shower Instructions:  Wash your face and private area with normal soap. If you choose to wash your hair, wash first with your  normal shampoo.  After you use shampoo/soap, rinse your hair and body thoroughly to remove shampoo/soap residue.  Turn the water OFF and apply about 3 tablespoons (45 ml) of CHG soap to a CLEAN washcloth.  Apply CHG soap ONLY FROM YOUR NECK DOWN TO YOUR TOES (washing for 3-5 minutes)  DO NOT use CHG soap on face, private areas, open wounds, or sores.  Pay special attention to the area where your surgery is being performed.  If you are having back surgery, having someone wash your back for you may be helpful. Wait 2 minutes after CHG soap is applied, then you may rinse off the CHG soap.  Pat dry with a clean towel  Put on clean clothes/pajamas   If you choose to wear lotion, please use ONLY the CHG-compatible lotions that  are listed below.  Additional instructions for the day of surgery: DO NOT APPLY any lotions, deodorants, cologne, or perfumes.   Do not bring valuables to the hospital. [facility] is not responsible for any belongings/valuables. Do not wear nail polish, gel polish, artificial nails, or any other type of covering on natural nails (fingers and toes) Do not wear jewelry or makeup Put on clean/comfortable clothes.  Please brush your teeth.  Ask your nurse before applying any prescription medications to the skin.     CHG Compatible Lotions   Aveeno Moisturizing lotion  Cetaphil Moisturizing Cream  Cetaphil Moisturizing Lotion  Clairol Herbal Essence Moisturizing Lotion, Dry Skin  Clairol Herbal Essence Moisturizing Lotion, Extra Dry Skin  Clairol Herbal Essence Moisturizing Lotion, Normal Skin  Curel Age Defying Therapeutic Moisturizing Lotion with Alpha Hydroxy  Curel Extreme Care Body Lotion  Curel Soothing Hands Moisturizing Hand Lotion  Curel Therapeutic Moisturizing Cream, Fragrance-Free  Curel Therapeutic Moisturizing Lotion, Fragrance-Free  Curel Therapeutic Moisturizing Lotion, Original Formula  Eucerin Daily Replenishing Lotion  Eucerin Dry Skin Therapy  Plus Alpha Hydroxy Crme  Eucerin Dry Skin Therapy Plus Alpha Hydroxy Lotion  Eucerin Original Crme  Eucerin Original Lotion  Eucerin Plus Crme Eucerin Plus Lotion  Eucerin TriLipid Replenishing Lotion  Keri Anti-Bacterial Hand Lotion  Keri Deep Conditioning Original Lotion Dry Skin Formula Softly Scented  Keri Deep Conditioning Original Lotion, Fragrance Free Sensitive Skin Formula  Keri Lotion Fast Absorbing Fragrance Free Sensitive Skin Formula  Keri Lotion Fast Absorbing Softly Scented Dry Skin Formula  Keri Original Lotion  Keri Skin Renewal Lotion Keri Silky Smooth Lotion  Keri Silky Smooth Sensitive Skin Lotion  Nivea Body Creamy Conditioning Oil  Nivea Body Extra Enriched Lotion  Nivea Body Original Lotion  Nivea Body Sheer Moisturizing Lotion Nivea Crme  Nivea Skin Firming Lotion  NutraDerm 30 Skin Lotion  NutraDerm Skin Lotion  NutraDerm Therapeutic Skin Cream  NutraDerm Therapeutic Skin Lotion  ProShield Protective Hand Cream  Provon moisturizing lotion  Please read over the following fact sheets that you were given.

## 2023-09-13 ENCOUNTER — Encounter (HOSPITAL_COMMUNITY)
Admission: RE | Admit: 2023-09-13 | Discharge: 2023-09-13 | Disposition: A | Discharge status: Home / Self Care | Source: Ambulatory Visit | Attending: Neurosurgery | Admitting: Neurosurgery

## 2023-09-13 ENCOUNTER — Other Ambulatory Visit (HOSPITAL_COMMUNITY)

## 2023-09-13 ENCOUNTER — Other Ambulatory Visit: Payer: Self-pay

## 2023-09-13 ENCOUNTER — Encounter (HOSPITAL_COMMUNITY): Payer: Self-pay

## 2023-09-13 VITALS — BP 154/99 | HR 62 | Temp 98.4°F | Resp 18 | Ht 67.0 in | Wt 230.6 lb

## 2023-09-13 DIAGNOSIS — I444 Left anterior fascicular block: Secondary | ICD-10-CM | POA: Insufficient documentation

## 2023-09-13 DIAGNOSIS — Z7982 Long term (current) use of aspirin: Secondary | ICD-10-CM | POA: Insufficient documentation

## 2023-09-13 DIAGNOSIS — Z6836 Body mass index (BMI) 36.0-36.9, adult: Secondary | ICD-10-CM | POA: Diagnosis not present

## 2023-09-13 DIAGNOSIS — N1831 Chronic kidney disease, stage 3a: Secondary | ICD-10-CM | POA: Insufficient documentation

## 2023-09-13 DIAGNOSIS — Z01818 Encounter for other preprocedural examination: Secondary | ICD-10-CM | POA: Insufficient documentation

## 2023-09-13 DIAGNOSIS — Z8673 Personal history of transient ischemic attack (TIA), and cerebral infarction without residual deficits: Secondary | ICD-10-CM | POA: Diagnosis not present

## 2023-09-13 DIAGNOSIS — E1122 Type 2 diabetes mellitus with diabetic chronic kidney disease: Secondary | ICD-10-CM | POA: Diagnosis not present

## 2023-09-13 DIAGNOSIS — G4733 Obstructive sleep apnea (adult) (pediatric): Secondary | ICD-10-CM | POA: Diagnosis not present

## 2023-09-13 DIAGNOSIS — E669 Obesity, unspecified: Secondary | ICD-10-CM | POA: Diagnosis not present

## 2023-09-13 DIAGNOSIS — I129 Hypertensive chronic kidney disease with stage 1 through stage 4 chronic kidney disease, or unspecified chronic kidney disease: Secondary | ICD-10-CM | POA: Insufficient documentation

## 2023-09-13 DIAGNOSIS — R001 Bradycardia, unspecified: Secondary | ICD-10-CM | POA: Insufficient documentation

## 2023-09-13 HISTORY — DX: Dependence on other enabling machines and devices: Z99.89

## 2023-09-13 HISTORY — DX: Sleep apnea, unspecified: G47.30

## 2023-09-13 HISTORY — DX: Depression, unspecified: F32.A

## 2023-09-13 LAB — CBC
HCT: 38.8 % (ref 36.0–46.0)
Hemoglobin: 12.9 g/dL (ref 12.0–15.0)
MCH: 27.5 pg (ref 26.0–34.0)
MCHC: 33.2 g/dL (ref 30.0–36.0)
MCV: 82.7 fL (ref 80.0–100.0)
Platelets: 189 K/uL (ref 150–400)
RBC: 4.69 MIL/uL (ref 3.87–5.11)
RDW: 14.1 % (ref 11.5–15.5)
WBC: 5.7 K/uL (ref 4.0–10.5)
nRBC: 0 % (ref 0.0–0.2)

## 2023-09-13 LAB — HEMOGLOBIN A1C
Hgb A1c MFr Bld: 6.3 % — ABNORMAL HIGH (ref 4.8–5.6)
Mean Plasma Glucose: 134.11 mg/dL

## 2023-09-13 LAB — BASIC METABOLIC PANEL WITH GFR
Anion gap: 8 (ref 5–15)
BUN: 9 mg/dL (ref 8–23)
CO2: 29 mmol/L (ref 22–32)
Calcium: 9.2 mg/dL (ref 8.9–10.3)
Chloride: 107 mmol/L (ref 98–111)
Creatinine, Ser: 0.83 mg/dL (ref 0.44–1.00)
GFR, Estimated: >60 mL/min (ref >60)
Glucose, Bld: 133 mg/dL — ABNORMAL HIGH (ref 70–99)
Potassium: 4.1 mmol/L (ref 3.5–5.1)
Sodium: 144 mmol/L (ref 135–145)

## 2023-09-13 LAB — TYPE AND SCREEN
ABO/RH(D): B POS
Antibody Screen: NEGATIVE

## 2023-09-13 LAB — SURGICAL PCR SCREEN
MRSA, PCR: NEGATIVE
Staphylococcus aureus: NEGATIVE

## 2023-09-13 LAB — GLUCOSE, CAPILLARY: Glucose-Capillary: 126 mg/dL — ABNORMAL HIGH (ref 70–99)

## 2023-09-13 NOTE — Progress Notes (Signed)
 Daughter Geni Kleine is present during PAT appt.per patient's request.  PCP - Dr Lamar Hobby Cardiologist - [facility]  Chest x-ray - n/a EKG - 09/13/23 Stress Test - n/a ECHO - n/a Cardiac Cath - n/a  ICD Pacemaker/Loop - n/a  Sleep Study -  Yes  CPAP - does not use CPAP  Diabetes Type 2 Fasting Blood Sugar - 120s-140s Checks Blood Sugar ___1__ times a day  THE NIGHT BEFORE SURGERY, take 40 Units of Insulin  Glargine Insulin .     If your blood sugar is less than 70 mg/dL, you will need to treat for low blood sugar: Treat a low blood sugar (less than 70 mg/dL) with  cup of clear juice (cranberry or apple), 4 glucose tablets, OR glucose gel. Recheck blood sugar in 15 minutes after treatment (to make sure it is greater than 70 mg/dL). If your blood sugar is not greater than 70 mg/dL on recheck, call [phone] for further instructions.  Aspirin  & Blood Thinner Instructions:  n/a  NPO  Anesthesia review: Yes  STOP now taking any Aspirin  (unless otherwise instructed by your surgeon), Aleve , Naproxen , Ibuprofen, Motrin, Advil, Goody's, BC's, all herbal medications, fish oil, and all vitamins.   Coronavirus Screening Do you have any of the following symptoms:  Cough yes/no: No Fever (>100.32F)  yes/no: No Runny nose yes/no: No Sore throat yes/no: No Difficulty breathing/shortness of breath  yes/no: No  Have you traveled in the last 14 days and where? yes/no: No  Patient and Daughter Geni verbalized understanding of instructions that were given to them at the PAT appointment. Patient was also instructed that they will need to review over the PAT instructions again at home before surgery.

## 2023-09-19 NOTE — Progress Notes (Signed)
 Anesthesia Chart Review:  73 year old female with pertinent history including HTN, IDDM 2, HLD, OSA not on CPAP, obesity BMI 36, CKD 3A, CVA.  Follows with neurology for history of recent subacute CVA 08/04/2023.  She initially presented to the hospital with progressive right-sided neck pain. MRI brain with subacute infarct within R centrum semiovale. CTA head/neck without LVO, no significant atherosclerosis of arteries in neck.  Echo showed EF 60 to 65%, grade 1 DD.  Subacute CVA thought due to small vessel disease.  Neurology recommended 81 mg ASA and statin and outpatient follow-up.  Patient was also found to have severe cervical spine stenosis on MRI and neurosurgery was consulted.  She was placed on a course of prednisone  and recommended to follow-up outpatient for surgical management.  Last seen in neurology follow-up by Duwaine Eriksson, NP on 09/06/2023.  Upcoming surgery was discussed.  Per note, Returns today for follow-up.  Patient was initially taken to the hospital for neck pain radiating to her back and being off balance.  They did find that she had spinal cord compression and she has followed with neurosurgery and will be having surgery on July 24.  The stroke was an incidental finding.  Patient does report having any strokelike symptoms.  She is currently on aspirin .  This will have to be stopped 1 week prior to her surgery.  Patient reported some mild cognitive decline and MMSE was performed with results of 17/30.  He was recommended to recheck this at follow-up in 6 to 8 months.  She was encouraged to follow-up with her PCP for stroke risk factor management.  Preop labs reviewed, unremarkable.  DM2 well-controlled with A1c 6.3.  TTE 08/05/2023: 1. Left ventricular ejection fraction, by estimation, is 60 to 65%. The  left ventricle has normal function. The left ventricle has no regional  wall motion abnormalities. There is moderate left ventricular hypertrophy.  Left ventricular diastolic   parameters are consistent with Grade I diastolic dysfunction (impaired  relaxation).   2. Right ventricular systolic function is normal. The right ventricular  size is normal.   3. The mitral valve is abnormal. No evidence of mitral valve  regurgitation. No evidence of mitral stenosis.   4. The aortic valve is tricuspid. There is moderate calcification of the  aortic valve. There is moderate thickening of the aortic valve. Aortic  valve regurgitation is not visualized. Aortic valve sclerosis is present,  with no evidence of aortic valve  stenosis.   5. The inferior vena cava is normal in size with greater than 50%  respiratory variability, suggesting right atrial pressure of 3 mmHg.

## 2023-09-20 ENCOUNTER — Telehealth: Payer: Self-pay | Admitting: *Deleted

## 2023-09-20 NOTE — Anesthesia Preprocedure Evaluation (Addendum)
 Anesthesia Evaluation  Patient identified by MRN, date of birth, ID band Patient awake    Reviewed: Allergy & Precautions, NPO status , Patient's Chart, lab work & pertinent test results  Airway Mallampati: III  TM Distance: >3 FB Neck ROM: Full    Dental no notable dental hx. (+) Partial Lower, Partial Upper   Pulmonary sleep apnea and Continuous Positive Airway Pressure Ventilation    Pulmonary exam normal breath sounds clear to auscultation       Cardiovascular hypertension, (-) angina (-) Past MI Normal cardiovascular exam Rhythm:Regular Rate:Normal  09/13/23 EKG SB. R 54. Left anterior fascicular block. Septal infarct , age undetermined    08/05/2023 TTE 1. Left ventricular ejection fraction, by estimation, is 60 to 65%. The  left ventricle has normal function. The left ventricle has no regional  wall motion abnormalities. There is moderate left ventricular hypertrophy.  Left ventricular diastolic  parameters are consistent with Grade I diastolic dysfunction (impaired  relaxation).   2. Right ventricular systolic function is normal. The right ventricular  size is normal.   3. The mitral valve is abnormal. No evidence of mitral valve  regurgitation. No evidence of mitral stenosis.   4. The aortic valve is tricuspid. There is moderate calcification of the  aortic valve. There is moderate thickening of the aortic valve. Aortic  valve regurgitation is not visualized. Aortic valve sclerosis is present,  with no evidence of aortic valve  stenosis.   5. The inferior vena cava is normal in size with greater than 50%  respiratory variability, suggesting right atrial pressure of 3 mmHg.     Neuro/Psych  PSYCHIATRIC DISORDERS  Depression    TIA   GI/Hepatic   Endo/Other  diabetes, Type 2    Renal/GU Renal InsufficiencyRenal disease     Musculoskeletal  (+) Arthritis ,    Abdominal  (+) + obese  Peds  Hematology Lab  Results      Component                Value               Date                      WBC                      5.7                 09/13/2023                HGB                      12.9                09/13/2023                HCT                      38.8                09/13/2023                MCV                      82.7                09/13/2023  PLT                      189                 09/13/2023              Anesthesia Other Findings All: Latex, codeine, Dapagliflozin  Reproductive/Obstetrics                              Anesthesia Physical Anesthesia Plan  ASA: 3  Anesthesia Plan: General   Post-op Pain Management: Precedex and Ofirmev  IV (intra-op)*   Induction: Intravenous  PONV Risk Score and Plan: 3 and Treatment may vary due to age or medical condition, Ondansetron  and Dexamethasone   Airway Management Planned: Oral ETT and Video Laryngoscope Planned  Additional Equipment: Arterial line  Intra-op Plan:   Post-operative Plan: Extubation in OR  Informed Consent: I have reviewed the patients History and Physical, chart, labs and discussed the procedure including the risks, benefits and alternatives for the proposed anesthesia with the patient or authorized representative who has indicated his/her understanding and acceptance.     Dental advisory given  Plan Discussed with: CRNA and Surgeon  Anesthesia Plan Comments: (  )         Anesthesia Quick Evaluation

## 2023-09-20 NOTE — Telephone Encounter (Signed)
 Needing pre op clearance for cervical fusion.

## 2023-09-21 ENCOUNTER — Encounter (HOSPITAL_COMMUNITY)
Admission: RE | Disposition: A | Discharge status: Skilled Nursing Facility | Payer: Self-pay | Source: Home / Self Care | Attending: Neurosurgery

## 2023-09-21 ENCOUNTER — Encounter (HOSPITAL_COMMUNITY): Payer: Self-pay | Admitting: Neurosurgery

## 2023-09-21 ENCOUNTER — Other Ambulatory Visit: Payer: Self-pay

## 2023-09-21 ENCOUNTER — Inpatient Hospital Stay (HOSPITAL_COMMUNITY)
Admission: RE | Admit: 2023-09-21 | Discharge: 2023-10-02 | DRG: 472 | Disposition: A | Discharge status: Skilled Nursing Facility | Attending: Neurosurgery | Admitting: Neurosurgery

## 2023-09-21 ENCOUNTER — Inpatient Hospital Stay (HOSPITAL_COMMUNITY)

## 2023-09-21 ENCOUNTER — Inpatient Hospital Stay (HOSPITAL_COMMUNITY): Payer: Self-pay | Admitting: Physician Assistant

## 2023-09-21 ENCOUNTER — Inpatient Hospital Stay (HOSPITAL_COMMUNITY): Payer: Self-pay | Admitting: Anesthesiology

## 2023-09-21 DIAGNOSIS — Z91040 Latex allergy status: Secondary | ICD-10-CM | POA: Diagnosis not present

## 2023-09-21 DIAGNOSIS — E119 Type 2 diabetes mellitus without complications: Secondary | ICD-10-CM | POA: Diagnosis present

## 2023-09-21 DIAGNOSIS — R2681 Unsteadiness on feet: Secondary | ICD-10-CM | POA: Diagnosis present

## 2023-09-21 DIAGNOSIS — F32A Depression, unspecified: Secondary | ICD-10-CM | POA: Diagnosis present

## 2023-09-21 DIAGNOSIS — E669 Obesity, unspecified: Secondary | ICD-10-CM | POA: Diagnosis present

## 2023-09-21 DIAGNOSIS — M4802 Spinal stenosis, cervical region: Principal | ICD-10-CM | POA: Diagnosis present

## 2023-09-21 DIAGNOSIS — Z79899 Other long term (current) drug therapy: Secondary | ICD-10-CM

## 2023-09-21 DIAGNOSIS — R8290 Unspecified abnormal findings in urine: Secondary | ICD-10-CM | POA: Diagnosis not present

## 2023-09-21 DIAGNOSIS — Z885 Allergy status to narcotic agent status: Secondary | ICD-10-CM | POA: Diagnosis not present

## 2023-09-21 DIAGNOSIS — I10 Essential (primary) hypertension: Secondary | ICD-10-CM | POA: Diagnosis present

## 2023-09-21 DIAGNOSIS — Z794 Long term (current) use of insulin: Secondary | ICD-10-CM | POA: Diagnosis not present

## 2023-09-21 DIAGNOSIS — G8250 Quadriplegia, unspecified: Secondary | ICD-10-CM | POA: Diagnosis not present

## 2023-09-21 DIAGNOSIS — R5383 Other fatigue: Secondary | ICD-10-CM | POA: Diagnosis not present

## 2023-09-21 DIAGNOSIS — R296 Repeated falls: Secondary | ICD-10-CM | POA: Diagnosis present

## 2023-09-21 DIAGNOSIS — E7800 Pure hypercholesterolemia, unspecified: Secondary | ICD-10-CM | POA: Diagnosis present

## 2023-09-21 DIAGNOSIS — G959 Disease of spinal cord, unspecified: Secondary | ICD-10-CM | POA: Diagnosis present

## 2023-09-21 DIAGNOSIS — M1990 Unspecified osteoarthritis, unspecified site: Secondary | ICD-10-CM | POA: Diagnosis present

## 2023-09-21 DIAGNOSIS — Z8673 Personal history of transient ischemic attack (TIA), and cerebral infarction without residual deficits: Secondary | ICD-10-CM | POA: Diagnosis not present

## 2023-09-21 DIAGNOSIS — M488X2 Other specified spondylopathies, cervical region: Secondary | ICD-10-CM | POA: Diagnosis present

## 2023-09-21 DIAGNOSIS — G8918 Other acute postprocedural pain: Secondary | ICD-10-CM | POA: Diagnosis not present

## 2023-09-21 DIAGNOSIS — Z888 Allergy status to other drugs, medicaments and biological substances status: Secondary | ICD-10-CM

## 2023-09-21 DIAGNOSIS — M4722 Other spondylosis with radiculopathy, cervical region: Principal | ICD-10-CM | POA: Diagnosis present

## 2023-09-21 DIAGNOSIS — G4730 Sleep apnea, unspecified: Secondary | ICD-10-CM | POA: Diagnosis present

## 2023-09-21 DIAGNOSIS — Z6836 Body mass index (BMI) 36.0-36.9, adult: Secondary | ICD-10-CM

## 2023-09-21 DIAGNOSIS — M4712 Other spondylosis with myelopathy, cervical region: Principal | ICD-10-CM | POA: Diagnosis present

## 2023-09-21 DIAGNOSIS — Z90710 Acquired absence of both cervix and uterus: Secondary | ICD-10-CM | POA: Diagnosis not present

## 2023-09-21 HISTORY — PX: POSTERIOR CERVICAL FUSION/FORAMINOTOMY: SHX5038

## 2023-09-21 LAB — GLUCOSE, CAPILLARY
Glucose-Capillary: 106 mg/dL — ABNORMAL HIGH (ref 70–99)
Glucose-Capillary: 133 mg/dL — ABNORMAL HIGH (ref 70–99)
Glucose-Capillary: 196 mg/dL — ABNORMAL HIGH (ref 70–99)
Glucose-Capillary: 200 mg/dL — ABNORMAL HIGH (ref 70–99)
Glucose-Capillary: 231 mg/dL — ABNORMAL HIGH (ref 70–99)
Glucose-Capillary: 253 mg/dL — ABNORMAL HIGH (ref 70–99)

## 2023-09-21 LAB — ABO/RH: ABO/RH(D): B POS

## 2023-09-21 SURGERY — POSTERIOR CERVICAL FUSION/FORAMINOTOMY LEVEL 5
Anesthesia: General | Site: Spine Cervical

## 2023-09-21 MED ORDER — ROSUVASTATIN CALCIUM 20 MG PO TABS
20.0000 mg | ORAL_TABLET | Freq: Every day | ORAL | Status: DC
  Administered 2023-09-21 – 2023-10-02 (×12): 20 mg via ORAL
  Filled 2023-09-21 (×12): qty 1

## 2023-09-21 MED ORDER — BISACODYL 10 MG RE SUPP
10.0000 mg | Freq: Every day | RECTAL | Status: DC | PRN
  Administered 2023-09-25: 10 mg via RECTAL
  Filled 2023-09-21: qty 1

## 2023-09-21 MED ORDER — OXYCODONE HCL 5 MG PO TABS
10.0000 mg | ORAL_TABLET | ORAL | Status: DC | PRN
  Administered 2023-09-21 – 2023-09-27 (×19): 10 mg via ORAL
  Filled 2023-09-21 (×20): qty 2

## 2023-09-21 MED ORDER — VASHE WOUND IRRIGATION OPTIME
TOPICAL | Status: DC | PRN
  Administered 2023-09-21: 34 [oz_av]

## 2023-09-21 MED ORDER — ACETAMINOPHEN 325 MG PO TABS
650.0000 mg | ORAL_TABLET | ORAL | Status: DC | PRN
Start: 2023-09-21 — End: 2023-09-28
  Administered 2023-09-22 – 2023-09-27 (×9): 650 mg via ORAL
  Filled 2023-09-21 (×9): qty 2

## 2023-09-21 MED ORDER — CEFAZOLIN SODIUM 1 G IJ SOLR
INTRAMUSCULAR | Status: AC
  Filled 2023-09-21: qty 20

## 2023-09-21 MED ORDER — FENTANYL CITRATE (PF) 250 MCG/5ML IJ SOLN
INTRAMUSCULAR | Status: AC
  Filled 2023-09-21: qty 5

## 2023-09-21 MED ORDER — ONDANSETRON HCL 4 MG PO TABS: 4.0000 mg | ORAL_TABLET | Freq: Four times a day (QID) | ORAL | Status: DC | PRN

## 2023-09-21 MED ORDER — GABAPENTIN 100 MG PO CAPS
200.0000 mg | ORAL_CAPSULE | Freq: Three times a day (TID) | ORAL | Status: DC
  Administered 2023-09-21 – 2023-10-02 (×33): 200 mg via ORAL
  Filled 2023-09-21 (×33): qty 2

## 2023-09-21 MED ORDER — DEXAMETHASONE SODIUM PHOSPHATE 10 MG/ML IJ SOLN
INTRAMUSCULAR | Status: DC | PRN
  Administered 2023-09-21: 10 mg via INTRAVENOUS

## 2023-09-21 MED ORDER — LIDOCAINE 2% (20 MG/ML) 5 ML SYRINGE
INTRAMUSCULAR | Status: AC
Start: 2023-09-21 — End: 2023-09-21
  Filled 2023-09-21: qty 5

## 2023-09-21 MED ORDER — DEXAMETHASONE SODIUM PHOSPHATE 10 MG/ML IJ SOLN
INTRAMUSCULAR | Status: AC
Start: 2023-09-21 — End: 2023-09-21
  Filled 2023-09-21: qty 1

## 2023-09-21 MED ORDER — PHENOL 1.4 % MT LIQD: 1.0000 | OROMUCOSAL | Status: DC | PRN

## 2023-09-21 MED ORDER — CEFAZOLIN SODIUM-DEXTROSE 2-4 GM/100ML-% IV SOLN
2.0000 g | Freq: Three times a day (TID) | INTRAVENOUS | Status: AC
  Administered 2023-09-21 – 2023-09-22 (×2): 2 g via INTRAVENOUS
  Filled 2023-09-21 (×2): qty 100

## 2023-09-21 MED ORDER — BUPIVACAINE-EPINEPHRINE (PF) 0.5% -1:200000 IJ SOLN
INTRAMUSCULAR | Status: DC | PRN
  Administered 2023-09-21: 10 mL

## 2023-09-21 MED ORDER — CHLORHEXIDINE GLUCONATE CLOTH 2 % EX PADS: 6.0000 | MEDICATED_PAD | Freq: Once | CUTANEOUS | Status: DC

## 2023-09-21 MED ORDER — LACTATED RINGERS IV SOLN: INTRAVENOUS | Status: DC

## 2023-09-21 MED ORDER — FENTANYL CITRATE (PF) 250 MCG/5ML IJ SOLN
INTRAMUSCULAR | Status: AC
Start: 2023-09-21 — End: 2023-09-21
  Filled 2023-09-21: qty 5

## 2023-09-21 MED ORDER — FENTANYL CITRATE (PF) 250 MCG/5ML IJ SOLN
INTRAMUSCULAR | Status: DC | PRN
  Administered 2023-09-21 (×4): 50 ug via INTRAVENOUS
  Administered 2023-09-21: 100 ug via INTRAVENOUS
  Administered 2023-09-21 (×2): 50 ug via INTRAVENOUS

## 2023-09-21 MED ORDER — SUGAMMADEX SODIUM 200 MG/2ML IV SOLN
INTRAVENOUS | Status: AC
  Filled 2023-09-21: qty 2

## 2023-09-21 MED ORDER — PROPOFOL 10 MG/ML IV BOLUS
INTRAVENOUS | Status: AC
  Filled 2023-09-21: qty 20

## 2023-09-21 MED ORDER — SUGAMMADEX SODIUM 200 MG/2ML IV SOLN
INTRAVENOUS | Status: DC | PRN
  Administered 2023-09-21: 100 mg via INTRAVENOUS
  Administered 2023-09-21: 200 mg via INTRAVENOUS

## 2023-09-21 MED ORDER — CEFAZOLIN SODIUM-DEXTROSE 2-4 GM/100ML-% IV SOLN
INTRAVENOUS | Status: AC
  Filled 2023-09-21: qty 100

## 2023-09-21 MED ORDER — HYDROCHLOROTHIAZIDE 25 MG PO TABS
25.0000 mg | ORAL_TABLET | Freq: Every day | ORAL | Status: DC
  Administered 2023-09-22 – 2023-10-02 (×11): 25 mg via ORAL
  Filled 2023-09-21 (×11): qty 1

## 2023-09-21 MED ORDER — EPHEDRINE SULFATE-NACL 50-0.9 MG/10ML-% IV SOSY
PREFILLED_SYRINGE | INTRAVENOUS | Status: DC | PRN
  Administered 2023-09-21: 5 mg via INTRAVENOUS

## 2023-09-21 MED ORDER — PANTOPRAZOLE SODIUM 40 MG IV SOLR
40.0000 mg | Freq: Every day | INTRAVENOUS | Status: DC
  Administered 2023-09-21: 40 mg via INTRAVENOUS
  Filled 2023-09-21: qty 10

## 2023-09-21 MED ORDER — THROMBIN 5000 UNITS EX SOLR
OROMUCOSAL | Status: DC | PRN
  Administered 2023-09-21: 5 mL via TOPICAL

## 2023-09-21 MED ORDER — PROPOFOL 10 MG/ML IV BOLUS
INTRAVENOUS | Status: DC | PRN
  Administered 2023-09-21: 50 mg via INTRAVENOUS
  Administered 2023-09-21: 150 mg via INTRAVENOUS

## 2023-09-21 MED ORDER — MENTHOL 3 MG MT LOZG: 1.0000 | LOZENGE | OROMUCOSAL | Status: DC | PRN

## 2023-09-21 MED ORDER — 0.9 % SODIUM CHLORIDE (POUR BTL) OPTIME
TOPICAL | Status: DC | PRN
  Administered 2023-09-21: 1000 mL

## 2023-09-21 MED ORDER — CYCLOBENZAPRINE HCL 5 MG PO TABS
5.0000 mg | ORAL_TABLET | Freq: Three times a day (TID) | ORAL | Status: DC | PRN
  Administered 2023-09-21 – 2023-09-30 (×13): 5 mg via ORAL
  Filled 2023-09-21 (×17): qty 1

## 2023-09-21 MED ORDER — HYDROMORPHONE HCL 1 MG/ML IJ SOLN
INTRAMUSCULAR | Status: AC
  Filled 2023-09-21: qty 1

## 2023-09-21 MED ORDER — OXYCODONE HCL 5 MG/5ML PO SOLN: 5.0000 mg | Freq: Once | ORAL | Status: AC | PRN

## 2023-09-21 MED ORDER — EPHEDRINE 5 MG/ML INJ
INTRAVENOUS | Status: AC
  Filled 2023-09-21: qty 5

## 2023-09-21 MED ORDER — LACTATED RINGERS IV SOLN: INTRAVENOUS | Status: DC | PRN

## 2023-09-21 MED ORDER — BUPIVACAINE-EPINEPHRINE (PF) 0.5% -1:200000 IJ SOLN
INTRAMUSCULAR | Status: AC
  Filled 2023-09-21: qty 30

## 2023-09-21 MED ORDER — THROMBIN 5000 UNITS EX KIT
PACK | CUTANEOUS | Status: AC
Start: 2023-09-21 — End: 2023-09-21
  Filled 2023-09-21: qty 1

## 2023-09-21 MED ORDER — OXYCODONE HCL 5 MG PO TABS
5.0000 mg | ORAL_TABLET | Freq: Once | ORAL | Status: AC | PRN
  Administered 2023-09-21: 5 mg via ORAL

## 2023-09-21 MED ORDER — KETAMINE HCL 50 MG/5ML IJ SOSY
PREFILLED_SYRINGE | INTRAMUSCULAR | Status: DC | PRN
Start: 2023-09-21 — End: 2023-09-21
  Administered 2023-09-21 (×2): 10 mg via INTRAVENOUS

## 2023-09-21 MED ORDER — PHENYLEPHRINE HCL (PRESSORS) 10 MG/ML IV SOLN
INTRAVENOUS | Status: AC
  Filled 2023-09-21: qty 1

## 2023-09-21 MED ORDER — ACETAMINOPHEN 650 MG RE SUPP
650.0000 mg | RECTAL | Status: DC | PRN
Start: 2023-09-21 — End: 2023-09-28

## 2023-09-21 MED ORDER — ACETAMINOPHEN 10 MG/ML IV SOLN: 1000.0000 mg | Freq: Once | INTRAVENOUS | Status: DC | PRN

## 2023-09-21 MED ORDER — ONDANSETRON HCL 4 MG/2ML IJ SOLN
INTRAMUSCULAR | Status: AC
  Filled 2023-09-21: qty 2

## 2023-09-21 MED ORDER — ACETAMINOPHEN 10 MG/ML IV SOLN
INTRAVENOUS | Status: AC
  Filled 2023-09-21: qty 100

## 2023-09-21 MED ORDER — KETAMINE HCL 50 MG/5ML IJ SOSY
PREFILLED_SYRINGE | INTRAMUSCULAR | Status: AC
  Filled 2023-09-21: qty 5

## 2023-09-21 MED ORDER — OXYCODONE HCL 5 MG PO TABS
ORAL_TABLET | ORAL | Status: AC
  Filled 2023-09-21: qty 1

## 2023-09-21 MED ORDER — BACITRACIN ZINC 500 UNIT/GM EX OINT
TOPICAL_OINTMENT | CUTANEOUS | Status: AC
  Filled 2023-09-21: qty 28.35

## 2023-09-21 MED ORDER — ALUM & MAG HYDROXIDE-SIMETH 200-200-20 MG/5ML PO SUSP: 30.0000 mL | Freq: Four times a day (QID) | ORAL | Status: DC | PRN

## 2023-09-21 MED ORDER — HYDROMORPHONE HCL 1 MG/ML IJ SOLN
0.2500 mg | INTRAMUSCULAR | Status: DC | PRN
  Administered 2023-09-21 (×3): 0.25 mg via INTRAVENOUS

## 2023-09-21 MED ORDER — ACETAMINOPHEN 10 MG/ML IV SOLN
INTRAVENOUS | Status: DC | PRN
  Administered 2023-09-21: 1000 mg via INTRAVENOUS

## 2023-09-21 MED ORDER — BACITRACIN ZINC 500 UNIT/GM EX OINT
TOPICAL_OINTMENT | CUTANEOUS | Status: DC | PRN
  Administered 2023-09-21: 1 via TOPICAL

## 2023-09-21 MED ORDER — ROCURONIUM BROMIDE 10 MG/ML (PF) SYRINGE
PREFILLED_SYRINGE | INTRAVENOUS | Status: DC | PRN
  Administered 2023-09-21: 70 mg via INTRAVENOUS
  Administered 2023-09-21: 20 mg via INTRAVENOUS
  Administered 2023-09-21 (×2): 30 mg via INTRAVENOUS

## 2023-09-21 MED ORDER — BUPIVACAINE LIPOSOME 1.3 % IJ SUSP
INTRAMUSCULAR | Status: AC
  Filled 2023-09-21: qty 20

## 2023-09-21 MED ORDER — PHENYLEPHRINE HCL (PRESSORS) 10 MG/ML IV SOLN
INTRAVENOUS | Status: AC
Start: 2023-09-21 — End: 2023-09-21
  Filled 2023-09-21: qty 1

## 2023-09-21 MED ORDER — CHLORHEXIDINE GLUCONATE 0.12 % MT SOLN
OROMUCOSAL | Status: AC
  Administered 2023-09-21: 15 mL
  Filled 2023-09-21: qty 15

## 2023-09-21 MED ORDER — PHENYLEPHRINE 80 MCG/ML (10ML) SYRINGE FOR IV PUSH (FOR BLOOD PRESSURE SUPPORT)
PREFILLED_SYRINGE | INTRAVENOUS | Status: AC
Start: 2023-09-21 — End: 2023-09-21
  Filled 2023-09-21: qty 10

## 2023-09-21 MED ORDER — ACETAMINOPHEN 500 MG PO TABS
1000.0000 mg | ORAL_TABLET | Freq: Four times a day (QID) | ORAL | Status: AC
  Administered 2023-09-21 – 2023-09-22 (×4): 1000 mg via ORAL
  Filled 2023-09-21 (×4): qty 2

## 2023-09-21 MED ORDER — ZOLPIDEM TARTRATE 5 MG PO TABS
5.0000 mg | ORAL_TABLET | Freq: Every evening | ORAL | Status: DC | PRN
  Administered 2023-09-21 – 2023-09-27 (×7): 5 mg via ORAL
  Filled 2023-09-21 (×7): qty 1

## 2023-09-21 MED ORDER — MORPHINE SULFATE (PF) 2 MG/ML IV SOLN
2.0000 mg | INTRAVENOUS | Status: DC | PRN
  Administered 2023-09-21: 4 mg via INTRAVENOUS
  Administered 2023-09-21: 2 mg via INTRAVENOUS
  Administered 2023-09-22: 4 mg via INTRAVENOUS
  Administered 2023-09-22: 2 mg via INTRAVENOUS
  Administered 2023-09-23 (×3): 4 mg via INTRAVENOUS
  Administered 2023-09-23 (×2): 2 mg via INTRAVENOUS
  Administered 2023-09-24 – 2023-09-27 (×2): 4 mg via INTRAVENOUS
  Filled 2023-09-21: qty 2
  Filled 2023-09-21: qty 1
  Filled 2023-09-21: qty 2
  Filled 2023-09-21: qty 1
  Filled 2023-09-21 (×4): qty 2
  Filled 2023-09-21: qty 1
  Filled 2023-09-21: qty 2
  Filled 2023-09-21: qty 1

## 2023-09-21 MED ORDER — CEFAZOLIN SODIUM-DEXTROSE 2-4 GM/100ML-% IV SOLN
2.0000 g | INTRAVENOUS | Status: AC
  Administered 2023-09-21 (×2): 2 g via INTRAVENOUS

## 2023-09-21 MED ORDER — BUPIVACAINE LIPOSOME 1.3 % IJ SUSP
INTRAMUSCULAR | Status: DC | PRN
  Administered 2023-09-21: 20 mL

## 2023-09-21 MED ORDER — INSULIN ASPART 100 UNIT/ML IJ SOLN: 0.0000 [IU] | INTRAMUSCULAR | Status: DC | PRN

## 2023-09-21 MED ORDER — PHENYLEPHRINE HCL-NACL 20-0.9 MG/250ML-% IV SOLN
INTRAVENOUS | Status: DC | PRN
  Administered 2023-09-21: 25 ug/min via INTRAVENOUS

## 2023-09-21 MED ORDER — ONDANSETRON HCL 4 MG/2ML IJ SOLN: 4.0000 mg | Freq: Once | INTRAMUSCULAR | Status: DC | PRN

## 2023-09-21 MED ORDER — INSULIN ASPART 100 UNIT/ML IJ SOLN
0.0000 [IU] | INTRAMUSCULAR | Status: DC
  Administered 2023-09-21: 11 [IU] via SUBCUTANEOUS
  Administered 2023-09-21: 7 [IU] via SUBCUTANEOUS
  Administered 2023-09-22: 3 [IU] via SUBCUTANEOUS
  Administered 2023-09-22: 4 [IU] via SUBCUTANEOUS
  Administered 2023-09-22: 3 [IU] via SUBCUTANEOUS
  Administered 2023-09-22 (×3): 4 [IU] via SUBCUTANEOUS
  Administered 2023-09-23: 7 [IU] via SUBCUTANEOUS
  Administered 2023-09-23: 4 [IU] via SUBCUTANEOUS

## 2023-09-21 MED ORDER — DOCUSATE SODIUM 100 MG PO CAPS
100.0000 mg | ORAL_CAPSULE | Freq: Two times a day (BID) | ORAL | Status: DC
  Administered 2023-09-21 – 2023-10-02 (×22): 100 mg via ORAL
  Filled 2023-09-21 (×22): qty 1

## 2023-09-21 MED ORDER — ROCURONIUM BROMIDE 10 MG/ML (PF) SYRINGE
PREFILLED_SYRINGE | INTRAVENOUS | Status: AC
  Filled 2023-09-21: qty 10

## 2023-09-21 MED ORDER — IRBESARTAN 300 MG PO TABS
300.0000 mg | ORAL_TABLET | Freq: Every day | ORAL | Status: DC
  Administered 2023-09-22 – 2023-10-02 (×11): 300 mg via ORAL
  Filled 2023-09-21 (×11): qty 1

## 2023-09-21 MED ORDER — ONDANSETRON HCL 4 MG/2ML IJ SOLN: 4.0000 mg | Freq: Four times a day (QID) | INTRAMUSCULAR | Status: DC | PRN

## 2023-09-21 MED ORDER — VALSARTAN-HYDROCHLOROTHIAZIDE 320-25 MG PO TABS: 1.0000 | ORAL_TABLET | Freq: Every morning | ORAL | Status: DC

## 2023-09-21 MED ORDER — ONDANSETRON HCL 4 MG/2ML IJ SOLN
INTRAMUSCULAR | Status: DC | PRN
  Administered 2023-09-21: 4 mg via INTRAVENOUS

## 2023-09-21 MED ORDER — OXYCODONE HCL 5 MG PO TABS
5.0000 mg | ORAL_TABLET | ORAL | Status: DC | PRN
  Administered 2023-09-22 – 2023-09-24 (×4): 5 mg via ORAL
  Filled 2023-09-21 (×3): qty 1

## 2023-09-21 MED ORDER — AMLODIPINE BESYLATE 5 MG PO TABS
5.0000 mg | ORAL_TABLET | Freq: Every day | ORAL | Status: DC
  Administered 2023-09-22 – 2023-10-02 (×11): 5 mg via ORAL
  Filled 2023-09-21 (×11): qty 1

## 2023-09-21 SURGICAL SUPPLY — 65 items
BAG COUNTER SPONGE SURGICOUNT (BAG) ×2 IMPLANT
BAND RUBBER #18 3X1/16 STRL (MISCELLANEOUS) ×4 IMPLANT
BASKET BONE COLLECTION (BASKET) IMPLANT
BENZOIN TINCTURE PRP APPL 2/3 (GAUZE/BANDAGES/DRESSINGS) ×2 IMPLANT
BIT DRILL NEURO 2X3.1 SFT TUCH (MISCELLANEOUS) ×2 IMPLANT
BLADE CLIPPER SURG (BLADE) ×2 IMPLANT
BLADE SURG 11 STRL SS (BLADE) IMPLANT
BLADE ULTRA TIP 2M (BLADE) IMPLANT
BUR 14 MATCH 3 (BUR) IMPLANT
BUR 15 MATCH 2.2 (BUR) IMPLANT
BUR MR8 14 BALL 5 (BUR) IMPLANT
BUR PRECISION FLUTE 6.0 (BURR) IMPLANT
CANISTER SUCTION 3000ML PPV (SUCTIONS) ×2 IMPLANT
CAP CLSR POST CERV (Cap) IMPLANT
CATH FOLEY LF 16FR (CATHETERS) IMPLANT
CLEANSER WND VASHE INSTL 34OZ (WOUND CARE) IMPLANT
COVERAGE SUPPORT O-ARM STEALTH (MISCELLANEOUS) ×2 IMPLANT
DRAPE C-ARM 42X72 X-RAY (DRAPES) ×4 IMPLANT
DRAPE LAPAROTOMY 100X72 PEDS (DRAPES) ×2 IMPLANT
DRAPE MICROSCOPE SLANT 54X150 (MISCELLANEOUS) IMPLANT
DRAPE SHEET LG 3/4 BI-LAMINATE (DRAPES) ×8 IMPLANT
DRAPE SURG 17X23 STRL (DRAPES) ×4 IMPLANT
DRSG OPSITE POSTOP 4X6 (GAUZE/BANDAGES/DRESSINGS) IMPLANT
ELECTRODE BLDE 4.0 EZ CLN MEGD (MISCELLANEOUS) ×2 IMPLANT
ELECTRODE REM PT RTRN 9FT ADLT (ELECTROSURGICAL) ×2 IMPLANT
FEE COVERAGE SUPPORT O-ARM (MISCELLANEOUS) ×2 IMPLANT
GAUZE 4X4 16PLY ~~LOC~~+RFID DBL (SPONGE) IMPLANT
GAUZE SPONGE 4X4 12PLY STRL (GAUZE/BANDAGES/DRESSINGS) IMPLANT
GLOVE BIO SURGEON STRL SZ8 (GLOVE) ×4 IMPLANT
GLOVE BIO SURGEON STRL SZ8.5 (GLOVE) ×4 IMPLANT
GLOVE EXAM NITRILE XL STR (GLOVE) IMPLANT
GOWN STRL REUS W/ TWL LRG LVL3 (GOWN DISPOSABLE) IMPLANT
GOWN STRL REUS W/ TWL XL LVL3 (GOWN DISPOSABLE) ×2 IMPLANT
GOWN STRL REUS W/TWL 2XL LVL3 (GOWN DISPOSABLE) IMPLANT
HEMOSTAT POWDER KIT SURGIFOAM (HEMOSTASIS) ×2 IMPLANT
KIT BASIN OR (CUSTOM PROCEDURE TRAY) ×2 IMPLANT
KIT TURNOVER KIT B (KITS) ×2 IMPLANT
MARKER SPHERE PSV REFLC NDI (MISCELLANEOUS) ×10 IMPLANT
NDL HYPO 22X1.5 SAFETY MO (MISCELLANEOUS) ×2 IMPLANT
NDL SPNL 18GX3.5 QUINCKE PK (NEEDLE) IMPLANT
NEEDLE HYPO 22X1.5 SAFETY MO (MISCELLANEOUS) ×2 IMPLANT
NEEDLE SPNL 18GX3.5 QUINCKE PK (NEEDLE) IMPLANT
NS IRRIG 1000ML POUR BTL (IV SOLUTION) ×2 IMPLANT
PACK LAMINECTOMY NEURO (CUSTOM PROCEDURE TRAY) ×2 IMPLANT
PAD ARMBOARD POSITIONER FOAM (MISCELLANEOUS) ×6 IMPLANT
PIN MAYFIELD SKULL DISP (PIN) ×2 IMPLANT
PUTTY DBM 10CC CALC GRAN (Putty) IMPLANT
ROD LORD VIR 3.5X100 NS (Rod) IMPLANT
SCREW PA CT VIRAGE 4.5X25 (Screw) IMPLANT
SCREW VIRAGE 3.5X14 (Screw) IMPLANT
SCREW VIRAGE 3.5X24MM (Screw) IMPLANT
SPIKE FLUID TRANSFER (MISCELLANEOUS) ×2 IMPLANT
SPONGE NEURO XRAY DETECT 1X3 (DISPOSABLE) IMPLANT
SPONGE SURGIFOAM ABS GEL 100 (HEMOSTASIS) IMPLANT
SPONGE T-LAP 4X18 ~~LOC~~+RFID (SPONGE) IMPLANT
STAPLER SKIN PROX 35W (STAPLE) IMPLANT
STRIP CLOSURE SKIN 1/2X4 (GAUZE/BANDAGES/DRESSINGS) ×2 IMPLANT
SUT ETHILON 2 0 FS 18 (SUTURE) IMPLANT
SUT VIC AB 0 CT1 18XCR BRD8 (SUTURE) ×2 IMPLANT
SUT VIC AB 2-0 CP2 18 (SUTURE) ×2 IMPLANT
TAP SURGICAL VIRAGE 3 (TAP) IMPLANT
TOWEL GREEN STERILE (TOWEL DISPOSABLE) ×2 IMPLANT
TOWEL GREEN STERILE FF (TOWEL DISPOSABLE) ×2 IMPLANT
TRAY FOLEY MTR SLVR 16FR STAT (SET/KITS/TRAYS/PACK) IMPLANT
WATER STERILE IRR 1000ML POUR (IV SOLUTION) ×2 IMPLANT

## 2023-09-21 NOTE — Telephone Encounter (Signed)
 Faxed to [phone]. Received confirmation back.

## 2023-09-21 NOTE — Anesthesia Procedure Notes (Signed)
 Procedure Name: Intubation Date/Time: 09/21/2023 7:47 AM  Performed by: Cindie Donald CROME, CRNAPre-anesthesia Checklist: Patient identified, Emergency Drugs available, Suction available and Patient being monitored Patient Re-evaluated:Patient Re-evaluated prior to induction Oxygen Delivery Method: Circle System Utilized Preoxygenation: Pre-oxygenation with 100% oxygen Induction Type: IV induction Ventilation: Mask ventilation without difficulty Laryngoscope Size: Glidescope and 3 Grade View: Grade I Tube type: Oral Tube size: 7.0 mm Number of attempts: 1 Airway Equipment and Method: Stylet Placement Confirmation: ETT inserted through vocal cords under direct vision, positive ETCO2 and breath sounds checked- equal and bilateral Secured at: 22 cm Tube secured with: Tape Dental Injury: Teeth and Oropharynx as per pre-operative assessment

## 2023-09-21 NOTE — Op Note (Signed)
 Brief history: The patient is a 73 year old black female who presented with neck pain bilateral arm and hand numbness tingling weakness, unsteady gait, etc. consistent with a cervical myelopathy.  She was worked up with a cervical MRI which demonstrated severe spinal stenosis and ossification of the posterior longitudinal ligament.  I discussed the various treatment options with her and recommended surgery.  She has a procedure to proceed with a cervical laminectomy instrumentation and fusion.  Preop diagnosis: Ossification of the posterior longitudinal ligament, cervical spondylosis, cervical stenosis, cervical myelopathy, cervicalgia, cervical radiculopathy  Postop diagnosis: The same  Procedure: C2-3, C3-4, C4-5, C5-6, C6-7, and C7-T1 decompressive laminectomy; C2-3, C3-4, C4-5, C5-6, C6-7 and C7-T1 posterior lateral arthrodesis with local morselized autograft bone and intra grow bone graft extender; posterior segmental instrumentation C2-T1 bilaterally with Zimmer titanium screws and rods; application of Stealth spinal neuronavigation.  Surgeon: Dr. Chyrl Budge  Assistant: Duwaine Beck, NP  Anesthesia: General Tracheal  Estimated blood loss: 150 cc  Specimens: None  Drains: None  Complications: None  Description of procedure: The patient was brought to the operating room by the anesthesia team.  General endotracheal anesthesia was induced.  I applied the [place] 3 point headrest to the patient's calvarium.  She was carefully turned to the prone position on the chest rolls.  Her suboccipital region was then shaved with clippers in this region her posterior neck and posterior thorax was then prepared with Betadine scrub and Betadine solution.  Sterile drapes were applied.  I injected the area to be incised with Marcaine  with epinephrine  solution.  I then used a scalpel to make a midline incision from C2-T1.  I used electrocautery to perform a bilateral subperiosteal dissection exposing  the spinous processes, lamina and facets from C2-T1.  We obtained an intraoperative radiograph to confirm our location.  We inserted the [facility] retractors for exposure.  I began the decompression by incising interspinous ligaments at C2-3, C3-4, C4-5, C5-6 and C6-7 and removed the interspinous ligament with the Leksell rongeur.  I used a high-speed drill to remove the soft tissue from the spinous processes lamina from C2 down to T1.  I used a high-speed drill to perform laminotomies bilaterally at C2-3, C3-4, C4-5, C5-6, C6-7 and C7-T1 I used a drill to thin out the lamina at C3, C4, C5, C6, and C7.  I used a Kerrison punches to complete the laminectomy at C3, C4, C5, C6 and C7.  I undercut the caudal edge of the C2 lamina.  This completed the decompression C2-3, C3-4, C4-5, C5-6, C6-7 and C7-T1.  We now turned attention to the instrumentation.  I applied the reference array to the C2 spinous process.  We then obtained intraoperative CT scan.  I used the remaining drill guide to cannulate the bilateral C2 pars, bilateral C3, C4, C5, C6 and C7 lateral masses and to cannulate the bilateral T1 pedicles.  I probed inside the drill holes and ruled out cortical breaches.  I inserted a 3.5 x 26 mm screw into the bilateral C2 pars.  We got good bony purchase.  I inserted a 3.5 x 14 mm screws into the lateral masses at C3, C4, C5, C6 and C7.  I inserted a 4.5 x 26 mm pedicle screw into the bilateral T1 pedicle.  I then connected the unilateral screws with a rod.  We secured the rod in place with the caps which we tightened appropriately.  This completed instrumentation from C2-T1 bilaterally.  We now turned attention to the arthrodesis.  I used a high-speed drill to decorticate the lateral facets at C2-3, C3-4, C4-5, C5-6, C6-7 and C7-T1.  We laid a combination of local morselized autograft bone we obtained during a decompression and intra grow bone graft extender over decorticated posterolateral structures at  C2-3, C3-4, C4-5, C5-6, C6-7 and C7-T1 completing the arthrodesis.  We then obtained hemostasis using bipolar electrocautery.  We irrigated the wound out with Vashe solution.  I injected Exparel .  We removed the retractors.  We placed a medium Hemovac drain in the epidural space and tunneled it out through a separate stab wound.  We then reapproximated the patient's cervical thoracic fascia with interrupted 0 Vicryl suture.  We reapproximated the subcutaneous tissue with interrupted 2-0 Vicryl suture.  We then reapproximated the skin with Steri-Strips and benzoin.  The wound was then coated with bacitracin  ointment.  A sterile dressing was applied.  The drapes were removed.  She was then returned to the supine position.  I have then removed the Mayfield 3 point headrest from her calvarium.  By report all sponge, instrument, and needle counts were correct at the end of this case.

## 2023-09-21 NOTE — Plan of Care (Signed)
  Problem: Education: Goal: Knowledge of General Education information will improve Description: Including pain rating scale, medication(s)/side effects and non-pharmacologic comfort measures Outcome: Not Progressing   Problem: Health Behavior/Discharge Planning: Goal: Ability to manage health-related needs will improve Outcome: Not Progressing   Problem: Clinical Measurements: Goal: Ability to maintain clinical measurements within normal limits will improve Outcome: Not Progressing Goal: Will remain free from infection Outcome: Not Progressing Goal: Diagnostic test results will improve Outcome: Not Progressing Goal: Respiratory complications will improve Outcome: Not Progressing Goal: Cardiovascular complication will be avoided Outcome: Not Progressing   Problem: Activity: Goal: Risk for activity intolerance will decrease Outcome: Not Progressing   Problem: Nutrition: Goal: Adequate nutrition will be maintained Outcome: Not Progressing   Problem: Coping: Goal: Level of anxiety will decrease Outcome: Not Progressing   Problem: Elimination: Goal: Will not experience complications related to bowel motility Outcome: Not Progressing Goal: Will not experience complications related to urinary retention Outcome: Not Progressing   Problem: Pain Managment: Goal: General experience of comfort will improve and/or be controlled Outcome: Not Progressing   Problem: Safety: Goal: Ability to remain free from injury will improve Outcome: Not Progressing   Problem: Skin Integrity: Goal: Risk for impaired skin integrity will decrease Outcome: Not Progressing   Problem: Education: Goal: Ability to describe self-care measures that may prevent or decrease complications (Diabetes Survival Skills Education) will improve Outcome: Not Progressing Goal: Individualized Educational Video(s) Outcome: Not Progressing   Problem: Coping: Goal: Ability to adjust to condition or change in  health will improve Outcome: Not Progressing   Problem: Fluid Volume: Goal: Ability to maintain a balanced intake and output will improve Outcome: Not Progressing   Problem: Health Behavior/Discharge Planning: Goal: Ability to identify and utilize available resources and services will improve Outcome: Not Progressing Goal: Ability to manage health-related needs will improve Outcome: Not Progressing   Problem: Metabolic: Goal: Ability to maintain appropriate glucose levels will improve Outcome: Not Progressing   Problem: Nutritional: Goal: Maintenance of adequate nutrition will improve Outcome: Not Progressing Goal: Progress toward achieving an optimal weight will improve Outcome: Not Progressing   Problem: Skin Integrity: Goal: Risk for impaired skin integrity will decrease Outcome: Not Progressing   Problem: Tissue Perfusion: Goal: Adequacy of tissue perfusion will improve Outcome: Not Progressing   Problem: Education: Goal: Ability to verbalize activity precautions or restrictions will improve Outcome: Not Progressing Goal: Knowledge of the prescribed therapeutic regimen will improve Outcome: Not Progressing Goal: Understanding of discharge needs will improve Outcome: Not Progressing   Problem: Activity: Goal: Ability to avoid complications of mobility impairment will improve Outcome: Not Progressing Goal: Ability to tolerate increased activity will improve Outcome: Not Progressing Goal: Will remain free from falls Outcome: Not Progressing   Problem: Bowel/Gastric: Goal: Gastrointestinal status for postoperative course will improve Outcome: Not Progressing   Problem: Clinical Measurements: Goal: Ability to maintain clinical measurements within normal limits will improve Outcome: Not Progressing Goal: Postoperative complications will be avoided or minimized Outcome: Not Progressing Goal: Diagnostic test results will improve Outcome: Not Progressing    Problem: Pain Management: Goal: Pain level will decrease Outcome: Not Progressing   Problem: Skin Integrity: Goal: Will show signs of wound healing Outcome: Not Progressing   Problem: Health Behavior/Discharge Planning: Goal: Identification of resources available to assist in meeting health care needs will improve Outcome: Not Progressing   Problem: Bladder/Genitourinary: Goal: Urinary functional status for postoperative course will improve Outcome: Not Progressing

## 2023-09-21 NOTE — Anesthesia Postprocedure Evaluation (Signed)
 Anesthesia Post Note  Patient: Carla Stokes  Procedure(s) Performed: POSTERIOR CERVICAL FUSION/FORAMINOTOMY Cervical two-Thoracic one laminectomy, instrumentation and fusion (Spine Cervical) APPLICATION OF O-ARM     Patient location during evaluation: PACU Anesthesia Type: General Level of consciousness: awake and alert Pain management: pain level controlled Vital Signs Assessment: post-procedure vital signs reviewed and stable Respiratory status: spontaneous breathing, nonlabored ventilation, respiratory function stable and patient connected to nasal cannula oxygen Cardiovascular status: blood pressure returned to baseline and stable Postop Assessment: no apparent nausea or vomiting Anesthetic complications: no   No notable events documented.  Last Vitals:  Vitals:   09/21/23 1345 09/21/23 1400  BP: (!) 164/82 (!) 171/83  Pulse: 77 78  Resp: 17 14  Temp:    SpO2: 99% 100%    Last Pain:  Vitals:   09/21/23 1330  TempSrc:   PainSc: 10-Worst pain ever                 [name]

## 2023-09-21 NOTE — H&P (Signed)
 Subjective: The patient is a 73 year old black female who presented with a cervical myelopathy.  She was worked up with a cervical MRI which demonstrated severe stenosis and ossification of the posterior longitudinal ligament.  I discussed the various treatment options with her.  She has decided to proceed with surgery.  Past Medical History:  Diagnosis Date   Arthritis    Depression    Diabetes mellitus without complication (HCC)    type 2   High cholesterol    Hypertension    Sleep apnea    does not use CPAP   Stroke (HCC) 07/2023   Walker as ambulation aid     Past Surgical History:  Procedure Laterality Date   ABDOMINAL AORTOGRAM W/LOWER EXTREMITY N/A 02/03/2022   Procedure: ABDOMINAL AORTOGRAM W/LOWER EXTREMITY;  Surgeon: Gretta Lonni PARAS, MD;  Location: [facility];  Service: Cardiovascular;  Laterality: N/A;   ABDOMINAL HYSTERECTOMY     BONE BIOPSY Right 03/10/2022   Procedure: BONE BIOPSY;  Surgeon: [name], [name];  Location: [facility];  Service: Podiatry;  Laterality: Right;   COLONOSCOPY     EYE SURGERY Bilateral    cataracts removed   WOUND DEBRIDEMENT Right 03/10/2022   Procedure: DEBRIDEMENT WOUND/ULCER;  Surgeon: [name], [name];  Location: [facility];  Service: Podiatry;  Laterality: Right;    Allergies  Allergen Reactions   Latex Itching   Codeine Other (See Comments)    Dizziness (intolerance)   Dapagliflozin Other (See Comments)    2017: yeast infection - known side effect     Social History   Tobacco Use   Smoking status: Never    Passive exposure: Never   Smokeless tobacco: Never  Substance Use Topics   Alcohol use: Yes    Comment: occaional wine    History reviewed. No pertinent family history. Prior to Admission medications   Medication Sig Start Date End Date Taking? Authorizing Provider  acetaminophen  (TYLENOL ) 500 MG tablet Take 500-1,000 mg by mouth every 6 (six) hours as needed for moderate pain.   Yes [provider]  amLODipine  (NORVASC ) 5 MG tablet Take 5 mg by mouth daily.   Yes [provider]  cyclobenzaprine  (FLEXERIL ) 5 MG tablet Take 5 mg by mouth 3 (three) times daily as needed for muscle spasms. 08/31/23  Yes [provider]  gabapentin  (NEURONTIN ) 100 MG capsule Take 2 capsules (200 mg total) by mouth 3 (three) times daily. 08/09/23 09/21/23 Yes Perri DELENA [name]., MD  insulin  glargine, 1 Unit Dial , (TOUJEO  SOLOSTAR) 300 UNIT/ML Solostar Pen Inject 80 Units into the skin at bedtime.   Yes [provider]  lidocaine  4 % Place 1 patch onto the skin daily. Remove & Discard patch within 12 hours or as directed by MD 08/10/23  Yes Perri DELENA [name]., MD  Multiple Vitamin (MULTIVITAMIN WITH MINERALS) TABS tablet Take 1 tablet by mouth daily.   Yes [provider]  oxyCODONE -acetaminophen  (PERCOCET/ROXICET) 5-325 MG tablet Take 1 tablet by mouth every 4 (four) hours as needed for moderate pain (pain score 4-6) or severe pain (pain score 7-10).   Yes [provider]  rosuvastatin  (CRESTOR ) 20 MG tablet Take 20 mg by mouth daily. 01/12/22  Yes [provider]  valsartan -hydrochlorothiazide  (DIOVAN -HCT) 320-25 MG tablet Take 1 tablet by mouth every morning.   Yes [provider]  zinc  gluconate 50 MG tablet Take 50 mg by mouth daily.   Yes [provider]  aspirin  EC 81 MG  tablet Take 81 mg by mouth daily. Swallow whole. Patient not taking: No sig reported    [provider]  OZEMPIC, 2 MG/DOSE, 8 MG/3ML SOPN Inject 2 mg into the skin every Wednesday. Patient not taking: No sig reported 08/16/22   [provider]     Review of Systems  Positive ROS: As above  All other systems have been reviewed and were otherwise negative with the exception of those mentioned in the HPI and as above.  Objective: Vital signs in last 24 hours: Pulse Rate:  [69] 69 (07/24 0557) Resp:  [18] 18 (07/24 0557) BP: (179)/(77) 179/77  (07/24 0557) SpO2:  [99 %] 99 % (07/24 0557) Weight:  [896 kg] 103 kg (07/24 0557) Estimated body mass index is 35.55 kg/m as calculated from the following:   Height as of this encounter: 5' 7 (1.702 m).   Weight as of this encounter: 103 kg.   General Appearance: Alert Head: Normocephalic, without obvious abnormality, atraumatic Eyes: PERRL, conjunctiva/corneas clear, EOM's intact,    Ears: Normal  Throat: Normal  Neck: Very limited range of motion back: unremarkable Lungs: Clear to auscultation bilaterally, respirations unlabored Heart: Regular rate and rhythm, no murmur, rub or gallop Abdomen: Soft, non-tender Extremities: Extremities normal, atraumatic, no cyanosis or edema Skin: unremarkable  NEUROLOGIC:   Mental status: alert and oriented,Motor Exam -she has weakness in her bilateral hands Sensory Exam - grossly normal Reflexes:  Coordination - grossly normal Gait -unsteady Balance -poor Cranial Nerves: I: smell Not tested  II: visual acuity  OS: Normal  OD: Normal   II: visual fields Full to confrontation  II: pupils Equal, round, reactive to light  III,VII: ptosis None  III,IV,VI: extraocular muscles  Full ROM  V: mastication Normal  V: facial light touch sensation  Normal  V,VII: corneal reflex  Present  VII: facial muscle function - upper  Normal  VII: facial muscle function - lower Normal  VIII: hearing Not tested  IX: soft palate elevation  Normal  IX,X: gag reflex Present  XI: trapezius strength  5/5  XI: sternocleidomastoid strength 5/5  XI: neck flexion strength  5/5  XII: tongue strength  Normal    Data Review Lab Results  Component Value Date   WBC 5.7 09/13/2023   HGB 12.9 09/13/2023   HCT 38.8 09/13/2023   MCV 82.7 09/13/2023   PLT 189 09/13/2023   Lab Results  Component Value Date   NA 144 09/13/2023   K 4.1 09/13/2023   CL 107 09/13/2023   CO2 29 09/13/2023   BUN 9 09/13/2023   CREATININE 0.83 09/13/2023   GLUCOSE 133 (H)  09/13/2023   Lab Results  Component Value Date   INR 1.0 10/20/2021    Assessment/Plan: Ossification of posterior longitudinal ligament, cervical spinal stenosis, cervical myelopathy, cervicalgia: I have discussed the situation with the patient.  I reviewed her imaging studies with her and pointed out the abnormalities.  We have discussed the various treatment options including surgery.  I have recommended the surgical treatment option of a cervical laminectomy instrumentation and fusion.  I have shown her surgical models.  I have given her a surgical pamphlet.  We have discussed the risk, benefits, alternatives, expected postoperative course, and likelihood of achieving our goals with surgery.  I have answered all her questions.  She has decided proceed with surgery.   Carla Stokes Budge 09/21/2023 7:22 AM

## 2023-09-21 NOTE — Transfer of Care (Signed)
 Immediate Anesthesia Transfer of Care Note  Patient: Carla Stokes  Procedure(s) Performed: POSTERIOR CERVICAL FUSION/FORAMINOTOMY Cervical two-Thoracic one laminectomy, instrumentation and fusion (Spine Cervical) APPLICATION OF O-ARM  Patient Location: PACU  Anesthesia Type:General  Level of Consciousness: awake  Airway & Oxygen Therapy: Patient Spontanous Breathing  Post-op Assessment: Report given to RN and Post -op Vital signs reviewed and stable  Post vital signs: Reviewed and stable  Last Vitals:  Vitals Value Taken Time  BP 177/70 1235  Temp    Pulse 85 09/21/23 12:34  Resp 7 09/21/23 12:34  SpO2 91 % 09/21/23 12:34  Vitals shown include unfiled device data.  Last Pain:  Vitals:   09/21/23 0610  TempSrc:   PainSc: 0-No pain         Complications: No notable events documented.

## 2023-09-21 NOTE — Anesthesia Procedure Notes (Signed)
 Arterial Line Insertion Start/End7/24/2025 7:55 AM, 09/21/2023 8:02 AM Performed by: Jefm Garnette LABOR, MD, Holtzman, Ariel Laren, CRNA, CRNA  Patient location: Pre-op. Preanesthetic checklist: patient identified, IV checked, site marked, risks and benefits discussed, surgical consent, monitors and equipment checked, pre-op evaluation, timeout performed and anesthesia consent Lidocaine  1% used for infiltration Left, radial was placed Catheter size: 20 G Hand hygiene performed  and maximum sterile barriers used   Attempts: 1 Procedure performed without using ultrasound guided technique. Following insertion, dressing applied and Biopatch. Post procedure assessment: normal and unchanged  Patient tolerated the procedure well with no immediate complications.

## 2023-09-21 NOTE — Progress Notes (Signed)
 Orthopedic Tech Progress Note Patient Details:  Carla Stokes Jun 06, 1950 [id number]  Ortho Devices Type of Ortho Device: Aspen cervical collar Ortho Device/Splint Location: at bedside for RN to apply Ortho Device/Splint Interventions: Ordered      Tinnie Ronal Brasil 09/21/2023, 6:22 PM

## 2023-09-22 ENCOUNTER — Encounter (HOSPITAL_COMMUNITY): Payer: Self-pay | Admitting: Neurosurgery

## 2023-09-22 LAB — GLUCOSE, CAPILLARY
Glucose-Capillary: 156 mg/dL — ABNORMAL HIGH (ref 70–99)
Glucose-Capillary: 146 mg/dL — ABNORMAL HIGH (ref 70–99)
Glucose-Capillary: 133 mg/dL — ABNORMAL HIGH (ref 70–99)
Glucose-Capillary: 163 mg/dL — ABNORMAL HIGH (ref 70–99)
Glucose-Capillary: 180 mg/dL — ABNORMAL HIGH (ref 70–99)
Glucose-Capillary: 172 mg/dL — ABNORMAL HIGH (ref 70–99)

## 2023-09-22 MED ORDER — PANTOPRAZOLE SODIUM 40 MG PO TBEC
40.0000 mg | DELAYED_RELEASE_TABLET | Freq: Every day | ORAL | Status: DC
  Administered 2023-09-22 – 2023-10-01 (×10): 40 mg via ORAL
  Filled 2023-09-22 (×10): qty 1

## 2023-09-22 NOTE — Consult Note (Signed)
      Physical Medicine and Rehabilitation Consult Reason for Consult: Evaluate appropriateness for Inpatient Rehab Referring Physician: Dr. Mavis    HPI: Jette Lewan is a 73 y.o. female with PMHx of  has a past medical history of Arthritis, Depression, Diabetes mellitus without complication (HCC), High cholesterol, Hypertension, Sleep apnea, Stroke (HCC) (07/2023), and Walker as ambulation aid. . 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  There was an incidental finding of subacute right stroke due to small vessel disease during that time.  She presented for scheduled surgery with Dr. Mavis 7-24 and had C2-T1 decompressive laminectomy, posterior lateral arthrodesis with bone graft , and posterior instrumentation/fusion.  On postop day 1, she was noted to be confused and weak. PM&R was consulted to evaluate appropriateness for IPR admission.   Per chart review, patient lives alone in a single-story home with one-step to enter.  Generally uses a rolling walker since her last hospital admission in June, and does have frequent falls.  She is independent of ADLs, but in general has a struggle to complete them.    Currently, she is mod to max assist for transfers, mod assist for bed mobility.  OT evaluations have been attempted twice and unable to complete due to fatigue. Will follow up with formal consult early next week once therapy tolerance has improved where she can participate.   Joesph JAYSON Likes, DO 09/22/2023

## 2023-09-22 NOTE — Progress Notes (Signed)
 OT Cancellation Note  Patient Details Name: Carla Stokes MRN: [id number] DOB: 24-Aug-1950   Cancelled Treatment:    Reason Eval/Treat Not Completed: Fatigue/lethargy limiting ability to participate.  OT will stop back around 1100 to attempted again.    Riven Mabile D Cypress Fanfan 09/22/2023, 9:16 AM 09/22/2023  RP, OTR/L  Acute Rehabilitation Services  Office:  [phone]

## 2023-09-22 NOTE — Progress Notes (Signed)
 Subjective: The patient is alert and pleasant.  She is mildly confused.  Objective: Vital signs in last 24 hours: Temp:  [97.6 F (36.4 C)-98.6 F (37 C)] 98 F (36.7 C) (07/25 0308) Pulse Rate:  [77-90] 77 (07/25 0308) Resp:  [10-20] 13 (07/25 0308) BP: (155-183)/(64-96) 183/83 (07/25 0308) SpO2:  [92 %-100 %] 96 % (07/25 0308) Arterial Line BP: (168-208)/(62-79) 208/79 (07/24 1245) Weight:  [106.5 kg] 106.5 kg (07/24 1651) Estimated body mass index is 36.77 kg/m as calculated from the following:   Height as of this encounter: 5' 7 (1.702 m).   Weight as of this encounter: 106.5 kg.   Intake/Output from previous day: 07/24 0701 - 07/25 0700 In: 3063.9 [I.V.:2663.9; IV Piggyback:400] Out: 3275 [Urine:3000; Drains:125; Blood:150] Intake/Output this shift: Total I/O In: 748.4 [I.V.:648.4; IV Piggyback:100] Out: 2580 [Urine:2500; Drains:80]  Physical exam the patient is alert and oriented to person and Carla Stokes.  She is moving all 4 extremities well.  Lab Results: No results for input(s): WBC, HGB, HCT, PLT in the last 72 hours. BMET No results for input(s): NA, K, CL, CO2, GLUCOSE, BUN, CREATININE, CALCIUM  in the last 72 hours.  Studies/Results: DG Cervical Spine 1 View Result Date: 09/21/2023 CLINICAL DATA:  Elective surgery. EXAM: DG CERVICAL SPINE - 1 VIEW COMPARISON:  Preoperative MRI 08/04/2023 FINDINGS: Single portable cross-table lateral view of the cervical spine submitted from the operating room. Surgical instrument localizes posteriorly at the C2-C3 level, with additional instrumentation posterior to the mid cervical spine. IMPRESSION: Intraoperative localization during cervical spine surgery. Electronically Signed   By: Andrea Gasman M.D.   On: 09/21/2023 12:47   DG O-ARM IMAGE ONLY/NO REPORT Result Date: 09/21/2023 There is no Radiologist interpretation  for this exam.   Assessment/Plan: Postop day #1: We will mobilize the  patient with PT.  It looks like she will need rehab.  I will ask the rehab team to evaluate her.  LOS: 1 day     Carla Stokes 09/22/2023, 6:46 AM     Patient ID: Carla Stokes, female   DOB: 21-Jan-1951, 73 y.o.   MRN: [id number]

## 2023-09-22 NOTE — Plan of Care (Signed)

## 2023-09-22 NOTE — Progress Notes (Signed)
 Inpatient Rehab Admissions Coordinator:   Consult received.  Therapy evaluations pending.   Reche Lowers, PT, DPT Admissions Coordinator [phone] 09/22/23  9:13 AM

## 2023-09-22 NOTE — Plan of Care (Signed)

## 2023-09-22 NOTE — Progress Notes (Signed)
 OT Cancellation Note  Patient Details Name: Carla Stokes MRN: [id number] DOB: 12/10/1950   Cancelled Treatment:    Reason Eval/Treat Not Completed: Fatigue/lethargy limiting ability to participate.  Second attempt, patient already returned to bed and sleeping soundly.    Marne Meline D Ethyl Vila 09/22/2023, 2:06 PM 09/22/2023  RP, OTR/L  Acute Rehabilitation Services  Office:  [phone]

## 2023-09-22 NOTE — TOC Initial Note (Signed)
 Transition of Care [facility]) - Initial/Assessment Note    Patient Details  Name: Carla Stokes MRN: [id number] Date of Birth: [birth year]  Transition of [facility]) CM/SW Contact:    Nola Devere Hands, RN Phone Number: 09/22/2023, 3:08 PM  Clinical Narrative:                 Case manager was notified by Daphne with [facility] that patient is active with them for HHPT.         Patient Goals and CMS Choice            Expected Discharge Plan and Services                                              Prior Living Arrangements/Services                       Activities of Daily Living      Permission Sought/Granted                  Emotional Assessment              Admission diagnosis:  Spinal stenosis, cervical region [M48.02] Cervical spondylosis with myelopathy and radiculopathy [M47.12, M47.22] Patient Active Problem List   Diagnosis Date Noted   Cervical spondylosis with myelopathy and radiculopathy 09/21/2023   Stroke (HCC) 08/08/2023   CVA (cerebral vascular accident) (HCC) 08/05/2023   Cervical stenosis of spinal canal 08/05/2023   Thyroid  nodule 08/05/2023   Essential hypertension 08/05/2023   Hyperlipidemia 08/05/2023   Type 2 diabetes mellitus (HCC) 08/05/2023   Bleeding internal hemorrhoids 04/13/2022   Subacute osteomyelitis of right foot (HCC) 03/16/2022   Diabetic foot ulcer (HCC) 01/18/2022   PAD (peripheral artery disease) (HCC) 01/18/2022   PCP:  Ofilia Lamar CROME, MD Pharmacy:   [facility] [phone] [address] [place], [place] - [address] AT [facility] [address] [place] [address] Phone: [phone] Fax: [phone]  [facility] [address] [place] [address] Phone: [phone] Fax: [phone]     Social Drivers of Health (SDOH) Social History: SDOH Screenings   Food Insecurity: No Food Insecurity (09/21/2023)  Housing: Low Risk   (09/21/2023)  Transportation Needs: No Transportation Needs (09/21/2023)  Utilities: Not At Risk (09/21/2023)  Depression (PHQ2-9): Low Risk  (05/18/2022)  Social Connections: Moderately Integrated (09/21/2023)  Tobacco Use: Low Risk  (09/21/2023)   SDOH Interventions:     Readmission Risk Interventions     No data to display

## 2023-09-22 NOTE — Evaluation (Signed)
 Physical Therapy Evaluation Patient Details Name: Carla Stokes MRN: [id number] DOB: 10-29-1950 Today's Date: 09/22/2023  History of Present Illness  73 yo female s/p C2-T1 decompressive laminectomy, posterior lateral arthrodesis, posterior segmental instrumentation C2-T1 bilat 7/24. 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  Clinical Impression   Pt presents with neck and bilat UE pain R>L, impaired sitting balance, poor activity tolerance, and significant difficulty mobilizing. Pt to benefit from acute PT to address deficits. Pt requiring mod-max assist for moving to/from EOB, pt sitting EOB x5 minutes before endorsing too much pain and requesting return to supine. Pt also limited by lethargy this date. Prior to admission, pt was independent with gait with RW and ADLs. Patient will benefit from intensive inpatient follow-up therapy, >3 hours/day. PT to progress mobility as tolerated, and will continue to follow acutely.          If plan is discharge home, recommend the following: A lot of help with walking and/or transfers;A lot of help with bathing/dressing/bathroom   Can travel by private vehicle        Equipment Recommendations None recommended by PT  Recommendations for Other Services       Functional Status Assessment Patient has had a recent decline in their functional status and demonstrates the ability to make significant improvements in function in a reasonable and predictable amount of time.     Precautions / Restrictions Precautions Precautions: Fall;Cervical Precaution/Restrictions Comments: Reviewed cervical precautions, brace application Required Braces or Orthoses: Cervical Brace Cervical Brace: Hard collar (okay to put on in sitting and not needed in bed per orders) Restrictions Weight Bearing Restrictions Per Provider Order: No      Mobility  Bed Mobility Overal bed mobility: Needs  Assistance Bed Mobility: Rolling, Sidelying to Sit, Sit to Sidelying Rolling: Mod assist Sidelying to sit: Mod assist     Sit to sidelying: Max assist General bed mobility comments: assist for log roll to/from EOB, for trunk and LE management. boost up assist with trendelenburg upon return to supine    Transfers                   General transfer comment: NT given pain, pt declines    Ambulation/Gait                  Stairs            Wheelchair Mobility     Tilt Bed    Modified Rankin (Stroke Patients Only)       Balance Overall balance assessment: Needs assistance, History of Falls Sitting-balance support: No upper extremity supported, Feet supported Sitting balance-Leahy Scale: Fair                                       Pertinent Vitals/Pain Pain Assessment Pain Assessment: Faces Faces Pain Scale: Hurts even more Pain Location: neck, bilat shoulder and UEs down to forearms R>L Pain Descriptors / Indicators: Guarding, Grimacing, Moaning Pain Intervention(s): Limited activity within patient's tolerance, Monitored during session, Repositioned, Premedicated before session    Home Living Family/patient expects to be discharged to:: Private residence Living Arrangements: Alone Available Help at Discharge: Family;Available PRN/intermittently Type of Home: House Home Access: Stairs to enter   Entrance Stairs-Number of Steps: 1   Home Layout: One level Home Equipment: Cane - single point;Hand held shower head;Grab  bars - tub/shower;Shower Counsellor (2 wheels)      Prior Function Prior Level of Function : Independent/Modified Independent             Mobility Comments: pt reports using RW since admission in June, endorses falls ADLs Comments: independent, but states things had been more difficult     Extremity/Trunk Assessment   Upper Extremity Assessment Upper Extremity Assessment: Defer to OT evaluation     Lower Extremity Assessment Lower Extremity Assessment: Generalized weakness    Cervical / Trunk Assessment Cervical / Trunk Assessment: Neck Surgery  Communication   Communication Communication: No apparent difficulties    Cognition Arousal: Lethargic Behavior During Therapy: Flat affect   PT - Cognitive impairments: Problem solving, Safety/Judgement                       PT - Cognition Comments: flat affect, increased processing time suspect due to lethargy Following commands: Impaired Following commands impaired: Follows one step commands with increased time     Cueing Cueing Techniques: Verbal cues, Gestural cues     General Comments General comments (skin integrity, edema, etc.): vss    Exercises     Assessment/Plan    PT Assessment Patient needs continued PT services  PT Problem List Decreased strength;Decreased mobility;Decreased activity tolerance;Decreased knowledge of use of DME;Decreased balance;Pain;Decreased knowledge of precautions;Decreased safety awareness       PT Treatment Interventions DME instruction;Therapeutic activities;Gait training;Therapeutic exercise;Patient/family education;Balance training;Stair training;Functional mobility training;Neuromuscular re-education    PT Goals (Current goals can be found in the Care Plan section)  Acute Rehab PT Goals Patient Stated Goal: return to independence PT Goal Formulation: With patient/family Time For Goal Achievement: 10/06/23 Potential to Achieve Goals: Good    Frequency Min 5X/week     Co-evaluation               AM-PAC PT 6 Clicks Mobility  Outcome Measure Help needed turning from your back to your side while in a flat bed without using bedrails?: A Lot Help needed moving from lying on your back to sitting on the side of a flat bed without using bedrails?: A Lot Help needed moving to and from a bed to a chair (including a wheelchair)?: Total Help needed standing up from a  chair using your arms (e.g., wheelchair or bedside chair)?: Total Help needed to walk in hospital room?: Total Help needed climbing 3-5 steps with a railing? : Total 6 Click Score: 8    End of Session Equipment Utilized During Treatment: Cervical collar Activity Tolerance: Patient limited by pain;Patient limited by lethargy Patient left: in bed;with call bell/phone within reach;with bed alarm set;with family/visitor present;with SCD's reapplied Nurse Communication: Mobility status PT Visit Diagnosis: Other abnormalities of gait and mobility (R26.89);Muscle weakness (generalized) (M62.81)    Time: 8941-8874 PT Time Calculation (min) (ACUTE ONLY): 27 min   Charges:   PT Evaluation $PT Eval Low Complexity: 1 Low PT Treatments $Therapeutic Activity: 8-22 mins PT General Charges $$ ACUTE PT VISIT: 1 Visit         Johana RAMAN, PT DPT Acute Rehabilitation Services Secure Chat Preferred  Office [phone]   Sanjeev Main E Johna 09/22/2023, 2:45 PM

## 2023-09-22 NOTE — Progress Notes (Signed)
 Patient presents in the bed. Nurse arrived to IV pump beeping. LR continuous bag changed to new bag. Patient family member called for update. Family member did not present on contact list. Family member was told what people presented on contact list. Family member requested that nurse call daughter to give update. Nurse relayed to family member that nurse will call daughter as soon as possible to give update. Fredie LITTIE Morones, RN

## 2023-09-23 LAB — GLUCOSE, CAPILLARY
Glucose-Capillary: 146 mg/dL — ABNORMAL HIGH (ref 70–99)
Glucose-Capillary: 238 mg/dL — ABNORMAL HIGH (ref 70–99)
Glucose-Capillary: 148 mg/dL — ABNORMAL HIGH (ref 70–99)
Glucose-Capillary: 161 mg/dL — ABNORMAL HIGH (ref 70–99)
Glucose-Capillary: 184 mg/dL — ABNORMAL HIGH (ref 70–99)

## 2023-09-23 MED ORDER — INSULIN ASPART 100 UNIT/ML IJ SOLN
0.0000 [IU] | Freq: Three times a day (TID) | INTRAMUSCULAR | Status: DC
  Administered 2023-09-23: 7 [IU] via SUBCUTANEOUS
  Administered 2023-09-23 – 2023-09-24 (×5): 4 [IU] via SUBCUTANEOUS
  Administered 2023-09-24: 3 [IU] via SUBCUTANEOUS
  Administered 2023-09-25: 7 [IU] via SUBCUTANEOUS
  Administered 2023-09-25 – 2023-09-26 (×4): 4 [IU] via SUBCUTANEOUS
  Administered 2023-09-26 (×2): 7 [IU] via SUBCUTANEOUS
  Administered 2023-09-26 – 2023-09-27 (×2): 4 [IU] via SUBCUTANEOUS
  Administered 2023-09-27 (×2): 7 [IU] via SUBCUTANEOUS
  Administered 2023-09-27 – 2023-09-28 (×4): 4 [IU] via SUBCUTANEOUS
  Administered 2023-09-28 – 2023-09-29 (×3): 7 [IU] via SUBCUTANEOUS
  Administered 2023-09-29 – 2023-09-30 (×4): 4 [IU] via SUBCUTANEOUS
  Administered 2023-09-30: 7 [IU] via SUBCUTANEOUS
  Administered 2023-09-30 – 2023-10-01 (×4): 4 [IU] via SUBCUTANEOUS
  Administered 2023-10-01: 3 [IU] via SUBCUTANEOUS
  Administered 2023-10-02 (×2): 4 [IU] via SUBCUTANEOUS

## 2023-09-23 NOTE — Progress Notes (Signed)
 Subjective: The patient is alert and pleasant.  She has had a flat affect as long as I have known her.   Objective: Vital signs in last 24 hours: Temp:  [97.9 F (36.6 C)-98.9 F (37.2 C)] 98.3 F (36.8 C) (07/26 0754) Pulse Rate:  [71-95] 95 (07/26 0754) Resp:  [12-18] 12 (07/26 0754) BP: (144-184)/(72-89) 178/83 (07/26 0754) SpO2:  [90 %-100 %] 98 % (07/26 0754) Estimated body mass index is 36.77 kg/m as calculated from the following:   Height as of this encounter: 5' 7 (1.702 m).   Weight as of this encounter: 106.5 kg.   Intake/Output from previous day: 07/25 0701 - 07/26 0700 In: 2288.1 [P.O.:360; I.V.:1928.1] Out: 4780 [Urine:4600; Drains:180] Intake/Output this shift: No intake/output data recorded.  Physical exam the patient is alert and oriented.  She is moving all 4 extremities well.  Lab Results: No results for input(s): WBC, HGB, HCT, PLT in the last 72 hours. BMET No results for input(s): NA, K, CL, CO2, GLUCOSE, BUN, CREATININE, CALCIUM  in the last 72 hours.  Studies/Results: DG Cervical Spine 1 View Result Date: 09/21/2023 CLINICAL DATA:  Elective surgery. EXAM: DG CERVICAL SPINE - 1 VIEW COMPARISON:  Preoperative MRI 08/04/2023 FINDINGS: Single portable cross-table lateral view of the cervical spine submitted from the operating room. Surgical instrument localizes posteriorly at the C2-C3 level, with additional instrumentation posterior to the mid cervical spine. IMPRESSION: Intraoperative localization during cervical spine surgery. Electronically Signed   By: Andrea Gasman M.D.   On: 09/21/2023 12:47   DG O-ARM IMAGE ONLY/NO REPORT Result Date: 09/21/2023 There is no Radiologist interpretation  for this exam.   Assessment/Plan: Postop day #2: It looks like the patient will need rehab.  I appreciate Dr. Lord attention.  We will remove her Hemovac.  LOS: 2 days     [name] 09/23/2023, 8:26 AM     Patient ID:  Apolinar Launie Ku, female   DOB: [birth year], 73 y.o.   MRN: [id number]

## 2023-09-23 NOTE — Progress Notes (Signed)
 Physical Therapy Treatment Patient Details Name: Zelia Yzaguirre MRN: [id number] DOB: 05-21-1950 Today's Date: 09/23/2023   History of Present Illness 73 yo female s/p C2-T1 decompressive laminectomy, posterior lateral arthrodesis, posterior segmental instrumentation C2-T1 bilat 7/24. 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    PT Comments  Pt awake and less drowsy this date vs evaluation. Pt overall requiring mod physical assist for bed mobility, transfers, and short-distance gait in room, pt very limited by severe pain once sitting EOB. PT called RN to room as pt crying in pain, received IV morphine  at end of session which pt states helped some. Pt progressing, anticipate even better progress with continued pain control.      If plan is discharge home, recommend the following: A lot of help with walking and/or transfers;A lot of help with bathing/dressing/bathroom   Can travel by private vehicle        Equipment Recommendations  None recommended by PT    Recommendations for Other Services       Precautions / Restrictions Precautions Precautions: Fall;Cervical Precaution Booklet Issued: Yes (comment) Precaution/Restrictions Comments: Reviewed cervical precautions, brace application Required Braces or Orthoses: Cervical Brace Cervical Brace: Hard collar (okay to put on in sitting and not needed in bed per orders) Restrictions Weight Bearing Restrictions Per Provider Order: No     Mobility  Bed Mobility Overal bed mobility: Needs Assistance Bed Mobility: Rolling, Sidelying to Sit, Sit to Sidelying Rolling: Mod assist Sidelying to sit: Mod assist       General bed mobility comments: assist for log roll to EOB, trunk elevation off of bed. Cues for sequencing    Transfers Overall transfer level: Needs assistance Equipment used: Rolling walker (2 wheels) Transfers: Sit to/from Stand Sit to Stand: Mod  assist           General transfer comment: assist for power up, rise, steadying. Cues for hand placement when moving to/from sit<>Stand.    Ambulation/Gait Ambulation/Gait assistance: Mod assist Gait Distance (Feet): 5 Feet Assistive device: Rolling walker (2 wheels) Gait Pattern/deviations: Step-through pattern, Decreased stride length, Trunk flexed Gait velocity: decr     General Gait Details: assist to steady, manage RW. Heavy anterior bias which is amenable to cues   Stairs             Wheelchair Mobility     Tilt Bed    Modified Rankin (Stroke Patients Only)       Balance Overall balance assessment: Needs assistance, History of Falls Sitting-balance support: No upper extremity supported, Feet supported Sitting balance-Leahy Scale: Fair     Standing balance support: During functional activity, Bilateral upper extremity supported, Reliant on assistive device for balance Standing balance-Leahy Scale: Poor                              Communication Communication Communication: No apparent difficulties  Cognition Arousal: Alert Behavior During Therapy: Flat affect   PT - Cognitive impairments: Problem solving, Safety/Judgement, Sequencing                       PT - Cognition Comments: flat affect, internally distracted by pain. max cues for cervical precautions especially during bed mobility Following commands: Impaired Following commands impaired: Follows one step commands with increased time    Cueing Cueing Techniques: Verbal cues, Gestural cues  Exercises  General Comments        Pertinent Vitals/Pain Pain Assessment Pain Assessment: 0-10 Pain Score: 8  Pain Location: neck, bilat shoulder and UEs Pain Descriptors / Indicators: Guarding, Grimacing, Moaning, Sore Pain Intervention(s): Limited activity within patient's tolerance, Monitored during session, Repositioned, RN gave pain meds during session    Home  Living                          Prior Function            PT Goals (current goals can now be found in the care plan section) Acute Rehab PT Goals Patient Stated Goal: return to independence PT Goal Formulation: With patient/family Time For Goal Achievement: 10/06/23 Potential to Achieve Goals: Good Progress towards PT goals: Progressing toward goals    Frequency    Min 5X/week      PT Plan      Co-evaluation              AM-PAC PT 6 Clicks Mobility   Outcome Measure  Help needed turning from your back to your side while in a flat bed without using bedrails?: A Lot Help needed moving from lying on your back to sitting on the side of a flat bed without using bedrails?: A Lot Help needed moving to and from a bed to a chair (including a wheelchair)?: A Lot Help needed standing up from a chair using your arms (e.g., wheelchair or bedside chair)?: A Lot Help needed to walk in hospital room?: A Lot Help needed climbing 3-5 steps with a railing? : Total 6 Click Score: 11    End of Session Equipment Utilized During Treatment: Cervical collar Activity Tolerance: Patient limited by pain Patient left: with call bell/phone within reach;in chair;with chair alarm set Nurse Communication: Mobility status PT Visit Diagnosis: Other abnormalities of gait and mobility (R26.89);Muscle weakness (generalized) (M62.81)     Time: 9140-9079 PT Time Calculation (min) (ACUTE ONLY): 21 min  Charges:    $Therapeutic Activity: 8-22 mins PT General Charges $$ ACUTE PT VISIT: 1 Visit                     Johana RAMAN, PT DPT Acute Rehabilitation Services Secure Chat Preferred  Office [phone]    Odessia Asleson FORBES [place] 09/23/2023, 9:55 AM

## 2023-09-23 NOTE — Evaluation (Signed)
 Occupational Therapy Evaluation Patient Details Name: Carla Stokes MRN: [id number] DOB: Jan 25, 1951 Today's Date: 09/23/2023   History of Present Illness   73 yo female s/p C2-T1 decompressive laminectomy, posterior lateral arthrodesis, posterior segmental instrumentation C2-T1 bilat 7/24. 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     Clinical Impressions Pt ind at baseline with ADLs/functional mobility, lives alone but family lives across the street/son works close by. Pt currently with incr neck and UE pain, needing up to max A for ADLs, mod A for bed mobility and min-mod A for transfers with RW. Pt with hypersensitivity to touch around bil shoulders, repors some decr sensation to BUE, 2+/5 globally, and pain with minimal bil shoulder flexion. Pt presenting with impairments listed below, will follow acutely. Patient will benefit from intensive inpatient follow-up therapy, >3 hours/day to maximize safety/ind with ADL/functional mobility.      If plan is discharge home, recommend the following:   A lot of help with walking and/or transfers;A lot of help with bathing/dressing/bathroom;Assistance with cooking/housework;Direct supervision/assist for medications management;Direct supervision/assist for financial management;Assist for transportation;Help with stairs or ramp for entrance     Functional Status Assessment   Patient has had a recent decline in their functional status and demonstrates the ability to make significant improvements in function in a reasonable and predictable amount of time.     Equipment Recommendations   Other (comment) (defer)     Recommendations for Other Services   PT consult;Rehab consult     Precautions/Restrictions   Precautions Precautions: Fall;Cervical Precaution Booklet Issued: Yes (comment) Precaution/Restrictions Comments: Reviewed cervical precautions, brace  application Required Braces or Orthoses: Cervical Brace Cervical Brace: Hard collar Restrictions Weight Bearing Restrictions Per Provider Order: No     Mobility Bed Mobility Overal bed mobility: Needs Assistance Bed Mobility: Rolling, Sidelying to Sit, Sit to Sidelying         Sit to sidelying: Mod assist General bed mobility comments: assist for BLE    Transfers Overall transfer level: Needs assistance Equipment used: Rolling walker (2 wheels) Transfers: Sit to/from Stand, Bed to chair/wheelchair/BSC Sit to Stand: Mod assist     Step pivot transfers: Min assist            Balance Overall balance assessment: Needs assistance, History of Falls Sitting-balance support: No upper extremity supported, Feet supported Sitting balance-Leahy Scale: Fair     Standing balance support: During functional activity, Bilateral upper extremity supported, Reliant on assistive device for balance Standing balance-Leahy Scale: Poor                             ADL either performed or assessed with clinical judgement   ADL Overall ADL's : Needs assistance/impaired Eating/Feeding: Minimal assistance;Sitting;Bed level   Grooming: Minimal assistance;Sitting;Bed level   Upper Body Bathing: Moderate assistance;Bed level;Sitting   Lower Body Bathing: Maximal assistance;Bed level;Sitting/lateral leans   Upper Body Dressing : Moderate assistance;Bed level;Sitting   Lower Body Dressing: Maximal assistance;Bed level;Sitting/lateral leans   Toilet Transfer: Moderate assistance;Rolling walker (2 wheels);BSC/3in1;Stand-pivot           Functional mobility during ADLs: Moderate assistance;Rolling walker (2 wheels)       Vision   Vision Assessment?: No apparent visual deficits     Perception Perception: Not tested       Praxis Praxis: Not tested       Pertinent Vitals/Pain Pain Assessment Pain Assessment:  Faces Pain Score: 8  Faces Pain Scale: Hurts whole  lot Pain Location: neck, bilat shoulder and UEs Pain Descriptors / Indicators: Guarding, Grimacing, Moaning, Sore Pain Intervention(s): Limited activity within patient's tolerance, Monitored during session, Repositioned     Extremity/Trunk Assessment Upper Extremity Assessment Upper Extremity Assessment: Generalized weakness (hypersensitive around bil shoulders, reports some decr sensation to BUE, however symmetrical, grossly 2+/5, also limited by pain)   Lower Extremity Assessment Lower Extremity Assessment: Defer to PT evaluation   Cervical / Trunk Assessment Cervical / Trunk Assessment: Neck Surgery   Communication Communication Communication: No apparent difficulties   Cognition Arousal: Alert Behavior During Therapy: Flat affect               OT - Cognition Comments: slow to process/initaite                 Following commands: Impaired Following commands impaired: Follows one step commands with increased time     Cueing  General Comments   Cueing Techniques: Verbal cues;Gestural cues  VSS on RA   Exercises     Shoulder Instructions      Home Living Family/patient expects to be discharged to:: Private residence Living Arrangements: Alone Available Help at Discharge: Family;Available PRN/intermittently Type of Home: House Home Access: Stairs to enter [employer] of Steps: 1   Home Layout: One level     Bathroom Shower/Tub: Walk-in shower         Home Equipment: Cane - single point;Hand held shower head;Grab bars - tub/shower;Shower Counsellor (2 wheels)          Prior Functioning/Environment Prior Level of Function : Independent/Modified Independent             Mobility Comments: pt reports using RW since admission in June, endorses falls ADLs Comments: independent, but states things had been more difficult, does not drive    OT Problem List: Decreased strength;Decreased range of motion;Impaired balance  (sitting and/or standing);Decreased activity tolerance;Decreased cognition;Decreased safety awareness;Impaired UE functional use;Impaired sensation   OT Treatment/Interventions: Self-care/ADL training;Therapeutic exercise;Energy conservation;DME and/or AE instruction;Therapeutic activities;Patient/family education;Balance training      OT Goals(Current goals can be found in the care plan section)   Acute Rehab OT Goals Patient Stated Goal: none stated OT Goal Formulation: With patient Time For Goal Achievement: 10/07/23 Potential to Achieve Goals: Good ADL Goals Pt Will Perform Upper Body Dressing: with min assist;sitting Pt Will Perform Lower Body Dressing: with min assist;sitting/lateral leans;with adaptive equipment;sit to/from stand Pt Will Transfer to Toilet: with supervision;ambulating;regular height toilet Pt/caregiver will Perform Home Exercise Program: Increased ROM;Increased strength;Both right and left upper extremity;With Supervision;With written HEP provided Additional ADL Goal #1: pt will perform bed mobility with supervision using log roll technique in prep for ADLs   OT Frequency:  Min 2X/week    Co-evaluation              AM-PAC OT 6 Clicks Daily Activity     Outcome Measure Help from another person eating meals?: A Little Help from another person taking care of personal grooming?: A Little Help from another person toileting, which includes using toliet, bedpan, or urinal?: A Lot Help from another person bathing (including washing, rinsing, drying)?: A Lot Help from another person to put on and taking off regular upper body clothing?: A Lot Help from another person to put on and taking off regular lower body clothing?: A Lot 6 Click Score: 14   End of Session Equipment Utilized During Treatment: Gait belt;Rolling  walker (2 wheels);Cervical collar Nurse Communication: Mobility status  Activity Tolerance: Patient tolerated treatment well Patient left: in  bed;with call bell/phone within reach;with bed alarm set;with family/visitor present  OT Visit Diagnosis: Unsteadiness on feet (R26.81);Other abnormalities of gait and mobility (R26.89);Muscle weakness (generalized) (M62.81)                Time: 8962-8893 OT Time Calculation (min): 29 min Charges:  OT General Charges $OT Visit: 1 Visit OT Evaluation $OT Eval Moderate Complexity: 1 Mod OT Treatments $Therapeutic Activity: 8-22 mins  Carla Stokes K, OTD, OTR/L SecureChat Preferred Acute Rehab [phone]   Carla Stokes 09/23/2023, 12:21 PM

## 2023-09-24 LAB — GLUCOSE, CAPILLARY
Glucose-Capillary: 174 mg/dL — ABNORMAL HIGH (ref 70–99)
Glucose-Capillary: 181 mg/dL — ABNORMAL HIGH (ref 70–99)
Glucose-Capillary: 149 mg/dL — ABNORMAL HIGH (ref 70–99)
Glucose-Capillary: 185 mg/dL — ABNORMAL HIGH (ref 70–99)

## 2023-09-24 NOTE — Progress Notes (Signed)
 Patient ID: Carla Stokes, female   DOB: Jun 18, 1950, 73 y.o.   MRN: [id number] BP (!) 150/68 (BP Location: Left Arm)   Pulse 95   Temp 99.2 F (37.3 C) (Oral)   Resp 14   Ht 5' 7 (1.702 m)   Wt 106.5 kg   SpO2 93%   BMI 36.77 kg/m  Alert and oriented Moving slowly Will need rehab Wound is clean and dry, no signs of infection

## 2023-09-24 NOTE — Progress Notes (Signed)
 Patient is more difficult to reorient tonight compared to last night. Despite redirection efforts, she continues to exhibit disoriented behavior.  She was cleaned and changed into a new gown approximately one hour ago.  Since then, she has removed both IVs and taken off her gown.  Patient remains confused and impulsive.  Patient is wearing mittens for safety.

## 2023-09-25 DIAGNOSIS — G8918 Other acute postprocedural pain: Secondary | ICD-10-CM | POA: Diagnosis not present

## 2023-09-25 DIAGNOSIS — G8250 Quadriplegia, unspecified: Secondary | ICD-10-CM

## 2023-09-25 DIAGNOSIS — M4722 Other spondylosis with radiculopathy, cervical region: Secondary | ICD-10-CM

## 2023-09-25 DIAGNOSIS — M4712 Other spondylosis with myelopathy, cervical region: Secondary | ICD-10-CM

## 2023-09-25 LAB — GLUCOSE, CAPILLARY
Glucose-Capillary: 201 mg/dL — ABNORMAL HIGH (ref 70–99)
Glucose-Capillary: 234 mg/dL — ABNORMAL HIGH (ref 70–99)
Glucose-Capillary: 193 mg/dL — ABNORMAL HIGH (ref 70–99)
Glucose-Capillary: 194 mg/dL — ABNORMAL HIGH (ref 70–99)
Glucose-Capillary: 200 mg/dL — ABNORMAL HIGH (ref 70–99)

## 2023-09-25 MED ORDER — KETOROLAC TROMETHAMINE 15 MG/ML IJ SOLN
15.0000 mg | Freq: Four times a day (QID) | INTRAMUSCULAR | Status: DC
  Administered 2023-09-25 – 2023-09-27 (×9): 15 mg via INTRAVENOUS
  Filled 2023-09-25 (×9): qty 1

## 2023-09-25 NOTE — Progress Notes (Signed)
 Occupational Therapy Treatment Patient Details Name: Carla Stokes MRN: [id number] DOB: 1950/06/11 Today's Date: 09/25/2023   History of present illness 73 yo female s/p C2-T1 decompressive laminectomy, posterior lateral arthrodesis, posterior segmental instrumentation C2-T1 bilat 7/24. 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   OT comments  Pt progressing toward goals, needs min-max A for ADLs, min A for bed mobility and min A for transfers with RW. Pt with impaired memory/safety awareness, needs cues to keep RW close for stability. Pt able to perform toileting and standing grooming task. Pt still with R >L BUE weakness, but functional for ADLs. Pt presenting with impairments listed below, will follow acutely. Patient will benefit from intensive inpatient follow-up therapy, >3 hours/day to maximize safety/ind with ADL/functional mobility.       If plan is discharge home, recommend the following:  A lot of help with walking and/or transfers;A lot of help with bathing/dressing/bathroom;Assistance with cooking/housework;Direct supervision/assist for medications management;Direct supervision/assist for financial management;Assist for transportation;Help with stairs or ramp for entrance   Equipment Recommendations  Other (comment) (defer)    Recommendations for Other Services PT consult;Rehab consult    Precautions / Restrictions Precautions Precautions: Fall;Cervical Precaution Booklet Issued: Yes (comment) Recall of Precautions/Restrictions: Impaired Precaution/Restrictions Comments: Reviewed cervical precautions, brace application Required Braces or Orthoses: Cervical Brace Cervical Brace: Hard collar Restrictions Weight Bearing Restrictions Per Provider Order: No       Mobility Bed Mobility Overal bed mobility: Needs Assistance Bed Mobility: Sidelying to Sit   Sidelying to sit: Min assist       General  bed mobility comments: minA for trunk elevation, cues for log roll technique    Transfers Overall transfer level: Needs assistance Equipment used: Rolling walker (2 wheels) Transfers: Sit to/from Stand Sit to Stand: Min assist                 Balance Overall balance assessment: Needs assistance, History of Falls Sitting-balance support: No upper extremity supported, Feet supported Sitting balance-Leahy Scale: Fair     Standing balance support: During functional activity, Bilateral upper extremity supported, Reliant on assistive device for balance Standing balance-Leahy Scale: Poor                             ADL either performed or assessed with clinical judgement   ADL Overall ADL's : Needs assistance/impaired                     Lower Body Dressing: Maximal assistance;Sitting/lateral leans Lower Body Dressing Details (indicate cue type and reason): to don socks Toilet Transfer: Minimal assistance;Ambulation;Rolling walker (2 wheels);BSC/3in1 Toilet Transfer Details (indicate cue type and reason): to [facility] due to urgency Toileting- Clothing Manipulation and Hygiene: Contact guard assist;Sitting/lateral lean Toileting - Clothing Manipulation Details (indicate cue type and reason): anterior pericare     Functional mobility during ADLs: Minimal assistance;Rolling walker (2 wheels)      Extremity/Trunk Assessment Upper Extremity Assessment Upper Extremity Assessment: Generalized weakness;RUE deficits/detail (R>L) RUE Deficits / Details: imparied coordination and sensation compared to LUE   Lower Extremity Assessment Lower Extremity Assessment: Defer to PT evaluation        Vision   Vision Assessment?: No apparent visual deficits   Perception Perception Perception: Not tested   Praxis Praxis Praxis: Not tested   Communication Communication Communication: No apparent difficulties   Cognition Arousal: Alert Behavior During Therapy:  Flat  affect Cognition: Cognition impaired       Memory impairment (select all impairments): Short-term memory     OT - Cognition Comments: difficulty recalling if she ate breakfast today, or if she got medication for pain                 Following commands: Impaired Following commands impaired: Follows one step commands with increased time      Cueing   Cueing Techniques: Verbal cues, Gestural cues  Exercises Exercises: Other exercises Other Exercises Other Exercises: digit opposition x5 BUE Other Exercises: digit composite flex/ext x5    Shoulder Instructions       General Comments VSS on RA    Pertinent Vitals/ Pain       Pain Assessment Pain Assessment: Faces Pain Score: 6  Faces Pain Scale: Hurts even more Pain Location: posterjior neck Pain Descriptors / Indicators: Guarding, Grimacing, Moaning, Sore, Crying Pain Intervention(s): Limited activity within patient's tolerance, Monitored during session, Repositioned, Premedicated before session  Home Living                                          Prior Functioning/Environment              Frequency  Min 2X/week        Progress Toward Goals  OT Goals(current goals can now be found in the care plan section)  Progress towards OT goals: Progressing toward goals  Acute Rehab OT Goals Patient Stated Goal: did not state OT Goal Formulation: With patient Time For Goal Achievement: 10/07/23 Potential to Achieve Goals: Good ADL Goals Pt Will Perform Upper Body Dressing: with min assist;sitting Pt Will Perform Lower Body Dressing: with min assist;sitting/lateral leans;with adaptive equipment;sit to/from stand Pt Will Transfer to Toilet: with supervision;ambulating;regular height toilet Pt/caregiver will Perform Home Exercise Program: Increased ROM;Increased strength;Both right and left upper extremity;With Supervision;With written HEP provided Additional ADL Goal #1: pt will perform bed  mobility with supervision using log roll technique in prep for ADLs  Plan      Co-evaluation                 AM-PAC OT 6 Clicks Daily Activity     Outcome Measure   Help from another person eating meals?: A Little Help from another person taking care of personal grooming?: A Little Help from another person toileting, which includes using toliet, bedpan, or urinal?: A Lot Help from another person bathing (including washing, rinsing, drying)?: A Lot Help from another person to put on and taking off regular upper body clothing?: A Lot Help from another person to put on and taking off regular lower body clothing?: A Lot 6 Click Score: 14    End of Session Equipment Utilized During Treatment: Gait belt;Rolling walker (2 wheels);Cervical collar  OT Visit Diagnosis: Unsteadiness on feet (R26.81);Other abnormalities of gait and mobility (R26.89);Muscle weakness (generalized) (M62.81)   Activity Tolerance Patient tolerated treatment well   Patient Left in chair;with call bell/phone within reach;with chair alarm set;with family/visitor present   Nurse Communication Mobility status        Time: 8881-8851 OT Time Calculation (min): 30 min  Charges: OT General Charges $OT Visit: 1 Visit OT Treatments $Self Care/Home Management : 23-37 mins  Zalma Channing K, OTD, OTR/L SecureChat Preferred Acute Rehab [phone]   Laneta POUR Koonce 09/25/2023, 12:31 PM

## 2023-09-25 NOTE — TOC Progression Note (Signed)
 Transition of Care [facility]) - Progression Note    Patient Details  Name: Carla Stokes MRN: [id number] Date of Birth: 13-Feb-1951  Transition of [facility]) CM/SW Contact  Lauraine FORBES Saa, LCSW Phone Number: 09/25/2023, 1:05 PM  Clinical Narrative:     1:05 PM Per chart review, patient resides at home alone. Patient has a PCP and insurance. Patient does not have SNF history. Patient is currently active with [facility] for HHPT. Patient has prior HH history with [facility]. Patient has DME (BSC, rolling walker, diabetic shoe) history with [employer]. Patient's preferred pharmacy's are [facility] and [facility] [phone]. Physical therapy recommended patient discharge to [facility]. [facility] consult was placed. TOC will continue to follow and be available to assist.  Expected Discharge Plan: IP Rehab Facility Barriers to Discharge: Continued Medical Work up, English as a second language teacher               Expected Discharge Plan and Services       Living arrangements for the past 2 months: Single Family Home                                       Social Drivers of Health (SDOH) Interventions SDOH Screenings   Food Insecurity: No Food Insecurity (09/21/2023)  Housing: Low Risk  (09/21/2023)  Transportation Needs: No Transportation Needs (09/21/2023)  Utilities: Not At Risk (09/21/2023)  Depression (PHQ2-9): Low Risk  (05/18/2022)  Social Connections: Moderately Integrated (09/21/2023)  Tobacco Use: Low Risk  (09/21/2023)    Readmission Risk Interventions     No data to display

## 2023-09-25 NOTE — Progress Notes (Addendum)
 Inpatient Rehab Admissions Coordinator:   Following for CIR.  PMR MD to consult today and I will f/u with goals/expectations of CIR stay.   1357: Please see consult from Dr. Carilyn.  Feels will need 24/7 physical assist and pt prefers SNF due to lack of caregiver support.  TOC notified and we will not pursue CIR at this time.   Reche Lowers, PT, DPT Admissions Coordinator [phone] 09/25/23  1:32 PM

## 2023-09-25 NOTE — Consult Note (Signed)
 Physical Medicine and Rehabilitation Consult Reason for Consult:cervical myelopathy Referring Physician: Mavis   HPI: Carla Stokes is a 73 y.o. female 73 y.o. female with PMHx of  has a past medical history of Arthritis, hypertension, depression, Diabetes mellitus without complication (HCC), High cholesterol, Hypertension, Sleep apnea, Stroke (HCC) (07/2023), and Walker as ambulation aid. . 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  There was an incidental finding of subacute right stroke due to small vessel disease during that time.  She presented for scheduled surgery with Dr. Mavis 7-24 and had C2-T1 decompressive laminectomy, posterior lateral arthrodesis with bone graft , and posterior instrumentation/fusion.  On postop day 1, she was noted to be confused and weak. PM&R was consulted to evaluate appropriateness for IPR admission.    Per chart review, patient lives alone in a single-story home with one-step to enter.  Generally uses a rolling walker since her last hospital admission in June, and does have frequent falls.  She is independent of ADLs, but in general has a struggle to complete them.  Pt lives alone, family available intermittently.  The patient states that she would like to go to skilled nursing facility after discharge and was asking about recommendations on this.   Currently, she was initially mod to max assist for transfers, mod assist for bed mobility.  PT re eval 7/26 was Mod A with ongoing pain complaints.  OT evaluations have been attempted twice and unable to complete due to fatigue. On 7/26 OT eval was completed and pt was MaxA for ADLs , Mod A bed mobility and Min/mod A transfers    Patient has been taking Tylenol  for pain this a.m. as well as a total of 1950 mg yesterday She took a total of 25 mg of oxycodone  immediate release on 7/27 and a total of 20 mg on 7/28 IV pain medications has not been using Dilaudid  but has used morphine  4 mg on 7/27 IV Toradol  15 mg on 7/28 In addition the patient is taking gabapentin  200 mg 3 times daily  Home: Home Living Family/patient expects to be discharged to:: Private residence Living Arrangements: Alone Available Help at Discharge: Family, Available PRN/intermittently Type of Home: House Home Access: Stairs to enter Secretary/administrator of Steps: 1 Home Layout: One level Bathroom Shower/Tub: Artist: Cane - single point, Hand held shower head, Grab bars - tub/shower, Shower seat, Agricultural consultant (2 wheels)  Functional History: Prior Function Prior Level of Function : Independent/Modified Independent Mobility Comments: pt reports using RW since admission in June, endorses falls ADLs Comments: independent, but states things had been more difficult, does not drive Functional Status:  Mobility: Bed Mobility Overal bed mobility: Needs Assistance Bed Mobility: Rolling, Sidelying to Sit, Sit to Sidelying Rolling: Mod assist Sidelying to sit: Mod assist Sit to sidelying: Mod assist General bed mobility comments: up in chair upon PT arrival and exit Transfers Overall transfer level: Needs assistance Equipment used: Rolling walker (2 wheels) Transfers: Sit to/from Stand Sit to Stand: Min assist Bed to/from chair/wheelchair/BSC transfer type:: Step pivot Step pivot transfers: Min assist General transfer comment: assist for initial power up and rise, cues for correct hand placement when rising and sitting Ambulation/Gait Ambulation/Gait assistance: Min assist, +2 safety/equipment (chair follow) Gait  Distance (Feet): 90 Feet Assistive device: Rolling walker (2 wheels) Gait Pattern/deviations:  Step-through pattern, Decreased stride length, Trunk flexed General Gait Details: assist to steady, guide RW, max cues for upright posture and placement in RW. Gait velocity: decr    ADL: ADL Overall ADL's : Needs assistance/impaired Eating/Feeding: Minimal assistance, Sitting, Bed level Grooming: Minimal assistance, Sitting, Bed level Upper Body Bathing: Moderate assistance, Bed level, Sitting Lower Body Bathing: Maximal assistance, Bed level, Sitting/lateral leans Upper Body Dressing : Moderate assistance, Bed level, Sitting Lower Body Dressing: Maximal assistance, Bed level, Sitting/lateral leans Toilet Transfer: Moderate assistance, Rolling walker (2 wheels), BSC/3in1, Stand-pivot Functional mobility during ADLs: Moderate assistance, Rolling walker (2 wheels)  Cognition: Cognition Orientation Level: Oriented to person, Oriented to place, Oriented to situation, Disoriented to time Cognition Arousal: Alert Behavior During Therapy: Flat affect   ROS Past Medical History:  Diagnosis Date   Arthritis    Depression    Diabetes mellitus without complication (HCC)    type 2   High cholesterol    Hypertension    Sleep apnea    does not use CPAP   Stroke (HCC) 07/2023   Walker as ambulation aid    Past Surgical History:  Procedure Laterality Date   ABDOMINAL AORTOGRAM W/LOWER EXTREMITY N/A 02/03/2022   Procedure: ABDOMINAL AORTOGRAM W/LOWER EXTREMITY;  Surgeon: Gretta Lonni PARAS, MD;  Location: [facility];  Service: Cardiovascular;  Laterality: N/A;   ABDOMINAL HYSTERECTOMY     BONE BIOPSY Right 03/10/2022   Procedure: BONE BIOPSY;  Surgeon: [name], [name];  Location: [facility] ORS;  Service: Podiatry;  Laterality: Right;   COLONOSCOPY     EYE SURGERY Bilateral    cataracts removed   POSTERIOR CERVICAL FUSION/FORAMINOTOMY N/A 09/21/2023   Procedure: POSTERIOR CERVICAL FUSION/FORAMINOTOMY Cervical two-Thoracic one laminectomy, instrumentation and fusion;  Surgeon:  Mavis Purchase, MD;  Location: [facility] OR;  Service: Neurosurgery;  Laterality: N/A;   WOUND DEBRIDEMENT Right 03/10/2022   Procedure: DEBRIDEMENT WOUND/ULCER;  Surgeon: [name], [name];  Location: [facility] ORS;  Service: Podiatry;  Laterality: Right;   History reviewed. No pertinent family history. Social History:  reports that she has never smoked. She has never been exposed to tobacco smoke. She has never used smokeless tobacco. She reports current alcohol use. She reports that she does not use drugs. Allergies:  Allergies  Allergen Reactions   Latex Itching   Codeine Other (See Comments)    Dizziness (intolerance)   Dapagliflozin Other (See Comments)    2017: yeast infection - known side effect    Medications Prior to Admission  Medication Sig Dispense Refill   acetaminophen  (TYLENOL ) 500 MG tablet Take 500-1,000 mg by mouth every 6 (six) hours as needed for moderate pain.     amLODipine  (NORVASC ) 5 MG tablet Take 5 mg by mouth daily.     cyclobenzaprine  (FLEXERIL ) 5 MG tablet Take 5 mg by mouth 3 (three) times daily as needed for muscle spasms.     gabapentin  (NEURONTIN ) 100 MG capsule Take 2 capsules (200 mg total) by mouth 3 (three) times daily. 180 capsule 0   insulin  glargine, 1 Unit Dial , (TOUJEO  SOLOSTAR) 300 UNIT/ML Solostar Pen Inject 80 Units into the skin at bedtime.     lidocaine  4 % Place 1 patch onto the skin daily. Remove & Discard patch within 12 hours or as directed by MD 6 patch 0   Multiple Vitamin (MULTIVITAMIN WITH MINERALS) TABS tablet Take 1 tablet by mouth daily.  oxyCODONE -acetaminophen  (PERCOCET/ROXICET) 5-325 MG tablet Take 1 tablet by mouth every 4 (four) hours as needed for moderate pain (pain score 4-6) or severe pain (pain score 7-10).     rosuvastatin  (CRESTOR ) 20 MG tablet Take 20 mg by mouth daily.     valsartan -hydrochlorothiazide  (DIOVAN -HCT) 320-25 MG tablet Take 1 tablet by mouth every morning.     zinc  gluconate 50 MG tablet Take 50 mg by mouth  daily.     aspirin  EC 81 MG tablet Take 81 mg by mouth daily. Swallow whole. (Patient not taking: No sig reported)     OZEMPIC, 2 MG/DOSE, 8 MG/3ML SOPN Inject 2 mg into the skin every Wednesday. (Patient not taking: No sig reported)       Blood pressure 98/82, pulse 76, temperature 99.8 F (37.7 C), temperature source Oral, resp. rate 16, height 5' 7 (1.702 m), weight 106.5 kg, SpO2 100%. Physical Exam Vitals and nursing note reviewed.  Constitutional:      Appearance: She is obese.  HENT:     Head: Normocephalic and atraumatic.     Mouth/Throat:     Mouth: Mucous membranes are moist.  Eyes:     Extraocular Movements: Extraocular movements intact.     Conjunctiva/sclera: Conjunctivae normal.     Pupils: Pupils are equal, round, and reactive to light.  Neck:     Comments: In cervical collar.  Was able to see the lower aspect of her cervical posterior incision with dry sterile dressing no evidence of seepage Cardiovascular:     Heart sounds: Normal heart sounds. No murmur heard. Pulmonary:     Effort: Pulmonary effort is normal.     Breath sounds: Normal breath sounds. No stridor. No wheezing or rhonchi.  Abdominal:     General: Bowel sounds are normal. There is no distension.     Tenderness: There is no abdominal tenderness.  Musculoskeletal:     Cervical back: Rigidity present.     Right lower leg: No edema.     Left lower leg: No edema.  Skin:    General: Skin is warm and dry.  Neurological:     Mental Status: She is alert and oriented to person, place, and time.     Comments: Sensation intact to light touch bilateral upper and lower limbs Motor strength is 3 - bilateral deltoid as well as bilateral triceps 3 - right bicep, 4 - left bicep 4/5 bilateral grip. 4/5 bilateral hip flexors knee extensors ankle dorsiflexors No evidence of spasticity on examination.  Speech without dysarthria or aphasia.     Results for orders placed or performed during the hospital  encounter of 09/21/23 (from the past 24 hours)  Glucose, capillary     Status: Abnormal   Collection Time: 09/24/23  4:00 PM  Result Value Ref Range   Glucose-Capillary 149 (H) 70 - 99 mg/dL  Glucose, capillary     Status: Abnormal   Collection Time: 09/24/23  9:04 PM  Result Value Ref Range   Glucose-Capillary 185 (H) 70 - 99 mg/dL  Glucose, capillary     Status: Abnormal   Collection Time: 09/25/23  7:58 AM  Result Value Ref Range   Glucose-Capillary 234 (H) 70 - 99 mg/dL  Glucose, capillary     Status: Abnormal   Collection Time: 09/25/23 12:12 PM  Result Value Ref Range   Glucose-Capillary 193 (H) 70 - 99 mg/dL   No results found.  Assessment/Plan: Diagnosis: Cervical myelopathy with upper extremity greater than lower extremity weakness consistent  with central cord syndrome Does the need for close, 24 hr/day medical supervision in concert with the patient's rehab needs make it unreasonable for this patient to be served in a less intensive setting? Yes Co-Morbidities requiring supervision/potential complications:  - Hypertension, diabetes, postoperative pain, morbid obesity Due to bladder management, bowel management, safety, skin/wound care, disease management, medication administration, pain management, and patient education, does the patient require 24 hr/day rehab nursing? Potentially Does the patient require coordinated care of a physician, rehab nurse, therapy disciplines of PT, OT to address physical and functional deficits in the context of the above medical diagnosis(es)? Potentially Addressing deficits in the following areas: balance, endurance, locomotion, strength, transferring, bowel/bladder control, bathing, dressing, toileting, and psychosocial support Can the patient actively participate in an intensive therapy program of at least 3 hrs of therapy per day at least 5 days per week? Potentially The potential for patient to make measurable gains while on inpatient rehab  is good Anticipated functional outcomes upon discharge from inpatient rehab are min assist  with PT, min assist with OT, n/a with SLP. Estimated rehab length of stay to reach the above functional goals is: NA Anticipated discharge destination: SNF Overall Rehab/Functional Prognosis: good  POST ACUTE RECOMMENDATIONS: This patient's condition is appropriate for continued rehabilitative care in the following setting: SNF Patient has agreed to participate in recommended program. Yes Note that insurance prior authorization may be required for reimbursement for recommended care.  Comment: Patient prefers SNF due to in availability of 24/7  min assist level of care, we discussed that insurance usually does not cover both inpatient rehab in the hospital setting plus SNF   MEDICAL RECOMMENDATIONS: Consider oral Toradol  for the next several days and stop use of IV opioids minimize sedation   I have personally performed a face to face diagnostic evaluation of this patient. Additionally, I have examined the patient's medical record including any pertinent labs and radiographic images.    Thanks,  Prentice FORBES Compton, MD 09/25/2023

## 2023-09-25 NOTE — Progress Notes (Signed)
   Providing Compassionate, Quality Care - Together   Subjective: Nurse reports issues with pain control this morning. Patient also with some confusion, possibly from the morphine  or cyclobenzaprine . Patient resting comfortably upon assessment.  Objective: Vital signs in last 24 hours: Temp:  [99 F (37.2 C)-99.8 F (37.7 C)] 99.8 F (37.7 C) (07/28 0740) Pulse Rate:  [74-98] 87 (07/28 0740) Resp:  [13-18] 13 (07/28 0740) BP: (117-173)/(60-98) 132/72 (07/28 0740) SpO2:  [94 %-99 %] 96 % (07/28 0740)  Intake/Output from previous day: 07/27 0701 - 07/28 0700 In: 998.9 [I.V.:998.9] Out: 2200 [Urine:2200] Intake/Output this shift: No intake/output data recorded.  Responds to voice, oriented x 4 PERRLA CN II-XII grossly intact MAE, Strength and sensation intact Incision is covered with Honeycomb dressing and Steri Strips; Dressing is clean, dry, and intact   Lab Results: No results for input(s): WBC, HGB, HCT, PLT in the last 72 hours. BMET No results for input(s): NA, K, CL, CO2, GLUCOSE, BUN, CREATININE, CALCIUM  in the last 72 hours.  Studies/Results: No results found.  Assessment/Plan: Patient underwent C2-3, C3-4, C4-5, C5-6, C6-7, and C7-T1 decompressive laminectomy followed by C2-3, C3-4, C4-5, C5-6, C6-7 and C7-T1 posterior lateral arthrodesis by Dr. Mavis on 09/21/2023. She has been slow to mobilize.   LOS: 4 days   -Toradol  added for pain control. Minimize IV morphine  and see if there is a correlation with confusion and the cyclobenzaprine . Can switch to methocarbamol  if needed. -Recommendation is for CIR. Formal evaluation to take place early this week.  I am in communication with my attending and they agree with the plan for this patient.   Gerard Beck, DNP, AGNP-C Nurse Practitioner  [facility] [address], [place], [place] [address] P: [phone]    F: [phone]  09/25/2023,  11:07 AM

## 2023-09-25 NOTE — Plan of Care (Signed)
  Problem: Education: Goal: Knowledge of General Education information will improve Description: Including pain rating scale, medication(s)/side effects and non-pharmacologic comfort measures Outcome: Progressing   Problem: Activity: Goal: Risk for activity intolerance will decrease Outcome: Progressing   Problem: Nutrition: Goal: Adequate nutrition will be maintained Outcome: Progressing   Problem: Elimination: Goal: Will not experience complications related to bowel motility Outcome: Progressing Goal: Will not experience complications related to urinary retention Outcome: Progressing   Problem: Safety: Goal: Ability to remain free from injury will improve Outcome: Progressing   Problem: Activity: Goal: Ability to tolerate increased activity will improve Outcome: Progressing

## 2023-09-25 NOTE — Progress Notes (Signed)
 Physical Therapy Treatment Patient Details Name: Carla Stokes MRN: [id number] DOB: Oct 06, 1950 Today's Date: 09/25/2023   History of Present Illness 73 yo female s/p C2-T1 decompressive laminectomy, posterior lateral arthrodesis, posterior segmental instrumentation C2-T1 bilat 7/24. 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    PT Comments  Pt tearful upon arrival to room due to pain, but once off the phone with her sisters pt less tearful. Pt demonstrating good mobility progression this date, ambulating short hallway distance with use of RW and light assist for transfers and gait. Pt remains a good intensive therapies candidate post-acutely, as pt was mod I PTA.     If plan is discharge home, recommend the following: A lot of help with walking and/or transfers;A little help with bathing/dressing/bathroom   Can travel by private vehicle        Equipment Recommendations  None recommended by PT    Recommendations for Other Services       Precautions / Restrictions Precautions Precautions: Fall;Cervical Precaution Booklet Issued: Yes (comment) Precaution/Restrictions Comments: Reviewed cervical precautions, brace application Required Braces or Orthoses: Cervical Brace Cervical Brace: Hard collar Restrictions Weight Bearing Restrictions Per Provider Order: No     Mobility  Bed Mobility Overal bed mobility: Needs Assistance             General bed mobility comments: up in chair upon PT arrival and exit    Transfers Overall transfer level: Needs assistance Equipment used: Rolling walker (2 wheels) Transfers: Sit to/from Stand Sit to Stand: Min assist           General transfer comment: assist for initial power up and rise, cues for correct hand placement when rising and sitting    Ambulation/Gait Ambulation/Gait assistance: Min assist, +2 safety/equipment (chair follow) Gait Distance (Feet): 90  Feet Assistive device: Rolling walker (2 wheels) Gait Pattern/deviations: Step-through pattern, Decreased stride length, Trunk flexed Gait velocity: decr     General Gait Details: assist to steady, guide RW, max cues for upright posture and placement in RW.   Stairs             Wheelchair Mobility     Tilt Bed    Modified Rankin (Stroke Patients Only)       Balance Overall balance assessment: Needs assistance, History of Falls Sitting-balance support: No upper extremity supported, Feet supported Sitting balance-Leahy Scale: Fair     Standing balance support: During functional activity, Bilateral upper extremity supported, Reliant on assistive device for balance Standing balance-Leahy Scale: Poor                              Communication Communication Communication: No apparent difficulties  Cognition Arousal: Alert Behavior During Therapy: Flat affect   PT - Cognitive impairments: Problem solving, Safety/Judgement, Sequencing                       PT - Cognition Comments: initially tearful upon PT arrival to room, talking to sisters on the phone. Once pt up and mobilizing, no longer tearful Following commands: Impaired Following commands impaired: Follows one step commands with increased time    Cueing Cueing Techniques: Verbal cues, Gestural cues  Exercises      General Comments General comments (skin integrity, edema, etc.): vss      Pertinent Vitals/Pain Pain Assessment Pain Assessment: Faces Faces Pain Scale: Hurts even more Pain  Location: neck, bilat shoulders Pain Descriptors / Indicators: Guarding, Grimacing, Moaning, Sore, Crying Pain Intervention(s): Monitored during session, Limited activity within patient's tolerance, Repositioned    Home Living                          Prior Function            PT Goals (current goals can now be found in the care plan section) Acute Rehab PT Goals Patient Stated  Goal: return to independence PT Goal Formulation: With patient/family Time For Goal Achievement: 10/06/23 Potential to Achieve Goals: Good Progress towards PT goals: Progressing toward goals    Frequency    Min 5X/week      PT Plan      Co-evaluation              AM-PAC PT 6 Clicks Mobility   Outcome Measure  Help needed turning from your back to your side while in a flat bed without using bedrails?: A Little Help needed moving from lying on your back to sitting on the side of a flat bed without using bedrails?: A Lot Help needed moving to and from a bed to a chair (including a wheelchair)?: A Lot Help needed standing up from a chair using your arms (e.g., wheelchair or bedside chair)?: A Little Help needed to walk in hospital room?: A Little Help needed climbing 3-5 steps with a railing? : Total 6 Click Score: 14    End of Session Equipment Utilized During Treatment: Cervical collar;Gait belt Activity Tolerance: Patient limited by pain Patient left: with call bell/phone within reach;in chair;with chair alarm set Nurse Communication: Mobility status PT Visit Diagnosis: Other abnormalities of gait and mobility (R26.89);Muscle weakness (generalized) (M62.81)     Time: 9060-9040 PT Time Calculation (min) (ACUTE ONLY): 20 min  Charges:    $Gait Training: 8-22 mins PT General Charges $$ ACUTE PT VISIT: 1 Visit                     Johana RAMAN, PT DPT Acute Rehabilitation Services Secure Chat Preferred  Office [phone]    Jaylan Duggar FORBES [place] 09/25/2023, 11:39 AM

## 2023-09-26 LAB — GLUCOSE, CAPILLARY
Glucose-Capillary: 205 mg/dL — ABNORMAL HIGH (ref 70–99)
Glucose-Capillary: 205 mg/dL — ABNORMAL HIGH (ref 70–99)
Glucose-Capillary: 175 mg/dL — ABNORMAL HIGH (ref 70–99)
Glucose-Capillary: 191 mg/dL — ABNORMAL HIGH (ref 70–99)

## 2023-09-26 NOTE — TOC Progression Note (Addendum)
 Transition of Care [facility]) - Progression Note    Patient Details  Name: Carla Stokes MRN: [id number] Date of Birth: 1950/05/30  [facility] CM/SW Contact  Lauraine FORBES Saa, LCSW Phone Number: 09/26/2023, 4:00 PM  Clinical Narrative:     4:00 PM CSW introduced self and role to patient. CSW informed patient of physical therapy's recommendation of patient discharging to SNF. Patient was agreeable with recommendation. Patient expressed interest in a private room at a SNF in [facility] to be closer to family. CSW sent referrals to SNFs in [facility] pending SSN.  Expected Discharge Plan: Skilled Nursing Facility Barriers to Discharge: Continued Medical Work up, English as a second language teacher, SNF Pending bed offer               Expected Discharge Plan and Services In-house Referral: Clinical Social Work   Post Acute Care Choice: Skilled Nursing Facility Living arrangements for the past 2 months: Single Family Home                                       Social Drivers of Health (SDOH) Interventions SDOH Screenings   Food Insecurity: No Food Insecurity (09/21/2023)  Housing: Low Risk  (09/21/2023)  Transportation Needs: No Transportation Needs (09/21/2023)  Utilities: Not At Risk (09/21/2023)  Depression (PHQ2-9): Low Risk  (05/18/2022)  Social Connections: Moderately Integrated (09/21/2023)  Tobacco Use: Low Risk  (09/21/2023)    Readmission Risk Interventions     No data to display

## 2023-09-26 NOTE — [place] FL2 (Signed)
 [place]  MEDICAID FL2 LEVEL OF CARE FORM     IDENTIFICATION  Patient Name: Carla Stokes Birthdate: 04/03/1950 Sex: female Admission Date (Current Location): 09/21/2023  [facility] and [place] Number:  Producer, television/film/video and Address:  [facility], [address] [place], [place] [address]      Provider Number: [id number]  Attending Physician Name and Address:  Mavis Purchase, MD  Relative Name and Phone Number:  Geni Kleine; Daughter; [phone]    Current Level of Care: SNF Recommended Level of Care: Skilled Nursing Facility Prior Approval Number:    Date Approved/Denied:   PASRR Number: [id number]  Discharge Plan: SNF    Current Diagnoses: Patient Active Problem List   Diagnosis Date Noted   Cervical spondylosis with myelopathy and radiculopathy 09/21/2023   Stroke (HCC) 08/08/2023   CVA (cerebral vascular accident) (HCC) 08/05/2023   Cervical stenosis of spinal canal 08/05/2023   Thyroid  nodule 08/05/2023   Essential hypertension 08/05/2023   Hyperlipidemia 08/05/2023   Type 2 diabetes mellitus (HCC) 08/05/2023   Bleeding internal hemorrhoids 04/13/2022   Subacute osteomyelitis of right foot (HCC) 03/16/2022   Diabetic foot ulcer (HCC) 01/18/2022   PAD (peripheral artery disease) (HCC) 01/18/2022    Orientation RESPIRATION BLADDER Height & Weight     Self, Time, Situation, Place  O2 (3L nasal cannula) Continent Weight: 234 lb 12.6 oz (106.5 kg) Height:  5' 7 (170.2 cm)  BEHAVIORAL SYMPTOMS/MOOD NEUROLOGICAL BOWEL NUTRITION STATUS      Continent Diet (Please see dischagre summary)  AMBULATORY STATUS COMMUNICATION OF NEEDS Skin   Extensive Assist Verbally Other (Comment) (Closed Surgical Incision Neck)                       Personal Care Assistance Level of Assistance  Bathing, Feeding, Dressing Bathing Assistance: Limited assistance Feeding assistance: Limited assistance Dressing Assistance: Limited assistance      Functional Limitations Info             SPECIAL CARE FACTORS FREQUENCY  PT (By licensed PT), OT (By licensed OT)     PT Frequency: 5x OT Frequency: 5x            Contractures Contractures Info: Not present    Additional Factors Info  Code Status, Allergies, Insulin  Sliding Scale, Psychotropic Code Status Info: Full Code Allergies Info: Latex; Codeine; Dapagliflozin Psychotropic Info: Please see discharge summary Insulin  Sliding Scale Info: Please see discharge summary       Current Medications (09/26/2023):  This is the current hospital active medication list Current Facility-Administered Medications  Medication Dose Route Frequency Provider Last Rate Last Admin   acetaminophen  (TYLENOL ) tablet 650 mg  650 mg Oral Q4H PRN Mavis Purchase, MD   650 mg at 09/26/23 9483   Or   acetaminophen  (TYLENOL ) suppository 650 mg  650 mg Rectal Q4H PRN Mavis Purchase, MD       alum & mag hydroxide-simeth (MAALOX/MYLANTA) 200-200-20 MG/5ML suspension 30 mL  30 mL Oral Q6H PRN Mavis Purchase, MD       amLODipine  (NORVASC ) tablet 5 mg  5 mg Oral Daily Mavis Purchase, MD   5 mg at 09/26/23 9166   bisacodyl  (DULCOLAX) suppository 10 mg  10 mg Rectal Daily PRN Mavis Purchase, MD   10 mg at 09/25/23 1331   cyclobenzaprine  (FLEXERIL ) tablet 5 mg  5 mg Oral TID PRN Mavis Purchase, MD   5 mg at 09/25/23 1159   docusate sodium  (COLACE) capsule  100 mg  100 mg Oral BID Mavis Purchase, MD   100 mg at 09/26/23 9166   gabapentin  (NEURONTIN ) capsule 200 mg  200 mg Oral TID Mavis Purchase, MD   200 mg at 09/26/23 9166   hydrochlorothiazide  (HYDRODIURIL ) tablet 25 mg  25 mg Oral Daily Paytes, Austin A, RPH   25 mg at 09/26/23 9166   insulin  aspart (novoLOG ) injection 0-20 Units  0-20 Units Subcutaneous TID AC & HS Mavis Purchase, MD   7 Units at 09/26/23 1223   irbesartan  (AVAPRO ) tablet 300 mg  300 mg Oral Daily Paytes, Austin A, RPH   300 mg at 09/26/23 9166   ketorolac  (TORADOL ) 15  MG/ML injection 15 mg  15 mg Intravenous Q6H Bergman, Meghan D, NP   15 mg at 09/26/23 1223   lactated ringers  infusion   Intravenous Continuous Mavis Purchase, MD 75 mL/hr at 09/25/23 1743 New Bag at 09/25/23 1743   menthol -cetylpyridinium (CEPACOL) lozenge 3 mg  1 lozenge Oral PRN Mavis Purchase, MD       Or   phenol (CHLORASEPTIC) mouth spray 1 spray  1 spray Mouth/Throat PRN Mavis Purchase, MD       morphine  (PF) 2 MG/ML injection 2-4 mg  2-4 mg Intravenous Q2H PRN Mavis Purchase, MD   4 mg at 09/24/23 0801   ondansetron  (ZOFRAN ) tablet 4 mg  4 mg Oral Q6H PRN Mavis Purchase, MD       Or   ondansetron  (ZOFRAN ) injection 4 mg  4 mg Intravenous Q6H PRN Mavis Purchase, MD       oxyCODONE  (Oxy IR/ROXICODONE ) immediate release tablet 10 mg  10 mg Oral Q3H PRN Mavis Purchase, MD   10 mg at 09/26/23 1311   oxyCODONE  (Oxy IR/ROXICODONE ) immediate release tablet 5 mg  5 mg Oral Q3H PRN Mavis Purchase, MD   5 mg at 09/24/23 2004   pantoprazole  (PROTONIX ) EC tablet 40 mg  40 mg Oral QHS Mitchell, Madeline I, RPH   40 mg at 09/25/23 2039   rosuvastatin  (CRESTOR ) tablet 20 mg  20 mg Oral Daily Mavis Purchase, MD   20 mg at 09/26/23 9166   zolpidem  (AMBIEN ) tablet 5 mg  5 mg Oral QHS PRN Mavis Purchase, MD   5 mg at 09/25/23 2039     Discharge Medications: Please see discharge summary for a list of discharge medications.  Relevant Imaging Results:  Relevant Lab Results:   Additional Information    Loveah Like FORBES Saa, LCSW

## 2023-09-26 NOTE — Plan of Care (Signed)

## 2023-09-26 NOTE — Progress Notes (Signed)
 Subjective: The patient is alert and pleasant.  She is eating her breakfast.  She is in no apparent distress.  Objective: Vital signs in last 24 hours: Temp:  [98.8 F (37.1 C)-99.1 F (37.3 C)] 98.8 F (37.1 C) (07/29 0745) Pulse Rate:  [71-76] 71 (07/29 0745) Resp:  [11-20] 20 (07/29 0745) BP: (98-146)/(61-84) 117/71 (07/29 0745) SpO2:  [96 %-100 %] 97 % (07/29 0745) Estimated body mass index is 36.77 kg/m as calculated from the following:   Height as of this encounter: 5' 7 (1.702 m).   Weight as of this encounter: 106.5 kg.   Intake/Output from previous day: No intake/output data recorded. Intake/Output this shift: No intake/output data recorded.  Physical exam the patient is alert and pleasant.  She is moving all 4 extremities well.  Her cervical incision is healing well.  Lab Results: No results for input(s): WBC, HGB, HCT, PLT in the last 72 hours. BMET No results for input(s): NA, K, CL, CO2, GLUCOSE, BUN, CREATININE, CALCIUM  in the last 72 hours.  Studies/Results: No results found.  Assessment/Plan: Postop day #5: The patient is ready for rehab versus skilled nursing facility placement.  I will transfer her to the floor.  LOS: 5 days     Carla Stokes 09/26/2023, 7:51 AM     Patient ID: Carla Stokes, female   DOB: September 24, 1950, 73 y.o.   MRN: [id number]

## 2023-09-26 NOTE — Progress Notes (Signed)
 Physical Therapy Treatment Patient Details Name: Carla Stokes MRN: [id number] DOB: [birth year] Today's Date: 09/26/2023   History of Present Illness 73 yo female s/p C2-T1 decompressive laminectomy, posterior lateral arthrodesis, posterior segmental instrumentation C2-T1 bilat 7/24. 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    PT Comments  Pt is in recliner at start of session and is agreeable to therapy despite complaints of pain. Pt is progressing mobility but still requires assist with transfers and education on cervical precautions. Pt was able to ambulate further but needs heavy assist with cues and to correct LOB. Pt remains a good candidate for physical therapy to address mobility deficits and will continue to progress once pain is managed.     If plan is discharge home, recommend the following: A lot of help with walking and/or transfers;A little help with bathing/dressing/bathroom   Can travel by private vehicle     Yes  Equipment Recommendations  None recommended by PT    Recommendations for Other Services       Precautions / Restrictions Precautions Precautions: Fall;Cervical Recall of Precautions/Restrictions: Impaired Precaution/Restrictions Comments: Reviewed when to don collar Required Braces or Orthoses: Cervical Brace Cervical Brace: Hard collar Restrictions Weight Bearing Restrictions Per Provider Order: No     Mobility  Bed Mobility               General bed mobility comments: Pt in recliner at start and end of session    Transfers Overall transfer level: Needs assistance Equipment used: Rolling walker (2 wheels) Transfers: Sit to/from Stand Sit to Stand: Mod assist           General transfer comment: Assist for initial power up and rise at sacrum    Ambulation/Gait Ambulation/Gait assistance: Min assist, +2 safety/equipment Gait Distance (Feet): 200 Feet Assistive  device: Rolling walker (2 wheels) Gait Pattern/deviations: Step-through pattern, Decreased stride length, Trunk flexed       General Gait Details: Assist to steady, guide RW. Max cues for placement in RW and upright posture. Bouts of increased cadence when feling rushed.   Stairs             Wheelchair Mobility     Tilt Bed    Modified Rankin (Stroke Patients Only)       Balance Overall balance assessment: Needs assistance, History of Falls Sitting-balance support: No upper extremity supported, Feet supported Sitting balance-Leahy Scale: Fair     Standing balance support: During functional activity, Bilateral upper extremity supported, Reliant on assistive device for balance Standing balance-Leahy Scale: Poor Standing balance comment: Note LOB and need assist to correct                            Communication Communication Communication: No apparent difficulties  Cognition Arousal: Alert Behavior During Therapy: WFL for tasks assessed/performed   PT - Cognitive impairments: Problem solving, Safety/Judgement, Sequencing                       PT - Cognition Comments: Pt verbalizing anxiety about pain and recovery. Educated pt on recovery processs Following commands: Impaired Following commands impaired: Follows one step commands with increased time    Cueing Cueing Techniques: Verbal cues, Gestural cues  Exercises      General Comments General comments (skin integrity, edema, etc.): VSS on RA      Pertinent Vitals/Pain Pain Assessment Pain  Assessment: Faces Faces Pain Scale: Hurts even more Pain Location: posterior neck Pain Descriptors / Indicators: Grimacing, Moaning, Sore Pain Intervention(s): Limited activity within patient's tolerance, Monitored during session    Home Living                          Prior Function            PT Goals (current goals can now be found in the care plan section) Acute Rehab PT  Goals Patient Stated Goal: return to independence PT Goal Formulation: With patient/family Time For Goal Achievement: 10/06/23 Potential to Achieve Goals: Good Progress towards PT goals: Progressing toward goals    Frequency    Min 5X/week      PT Plan      Co-evaluation              AM-PAC PT 6 Clicks Mobility   Outcome Measure  Help needed turning from your back to your side while in a flat bed without using bedrails?: A Little Help needed moving from lying on your back to sitting on the side of a flat bed without using bedrails?: A Lot Help needed moving to and from a bed to a chair (including a wheelchair)?: A Lot Help needed standing up from a chair using your arms (e.g., wheelchair or bedside chair)?: A Little Help needed to walk in hospital room?: A Little Help needed climbing 3-5 steps with a railing? : Total 6 Click Score: 14    End of Session Equipment Utilized During Treatment: Cervical collar;Gait belt Activity Tolerance: Patient tolerated treatment well Patient left: with call bell/phone within reach;in chair;with chair alarm set;with family/visitor present Nurse Communication: Mobility status PT Visit Diagnosis: Other abnormalities of gait and mobility (R26.89);Muscle weakness (generalized) (M62.81)     Time: 9082-9062 PT Time Calculation (min) (ACUTE ONLY): 20 min  Charges:    $Gait Training: 8-22 mins PT General Charges $$ ACUTE PT VISIT: 1 Visit                     Quintin Campi, SPT  Acute Rehab  [phone]    Quintin Campi 09/26/2023, 9:58 AM

## 2023-09-27 LAB — GLUCOSE, CAPILLARY
Glucose-Capillary: 206 mg/dL — ABNORMAL HIGH (ref 70–99)
Glucose-Capillary: 189 mg/dL — ABNORMAL HIGH (ref 70–99)
Glucose-Capillary: 168 mg/dL — ABNORMAL HIGH (ref 70–99)
Glucose-Capillary: 207 mg/dL — ABNORMAL HIGH (ref 70–99)

## 2023-09-27 LAB — CBC
HCT: 33.5 % — ABNORMAL LOW (ref 36.0–46.0)
Hemoglobin: 11.4 g/dL — ABNORMAL LOW (ref 12.0–15.0)
MCH: 27.1 pg (ref 26.0–34.0)
MCHC: 34 g/dL (ref 30.0–36.0)
MCV: 79.8 fL — ABNORMAL LOW (ref 80.0–100.0)
Platelets: 207 K/uL (ref 150–400)
RBC: 4.2 MIL/uL (ref 3.87–5.11)
RDW: 13 % (ref 11.5–15.5)
WBC: 5.5 K/uL (ref 4.0–10.5)
nRBC: 0 % (ref 0.0–0.2)

## 2023-09-27 LAB — BASIC METABOLIC PANEL WITH GFR
Anion gap: 9 (ref 5–15)
BUN: 17 mg/dL (ref 8–23)
CO2: 31 mmol/L (ref 22–32)
Calcium: 8.8 mg/dL — ABNORMAL LOW (ref 8.9–10.3)
Chloride: 98 mmol/L (ref 98–111)
Creatinine, Ser: 1.09 mg/dL — ABNORMAL HIGH (ref 0.44–1.00)
GFR, Estimated: 55 mL/min — ABNORMAL LOW (ref >60)
Glucose, Bld: 198 mg/dL — ABNORMAL HIGH (ref 70–99)
Potassium: 3.1 mmol/L — ABNORMAL LOW (ref 3.5–5.1)
Sodium: 138 mmol/L (ref 135–145)

## 2023-09-27 LAB — URINALYSIS, ROUTINE W REFLEX MICROSCOPIC
Bilirubin Urine: NEGATIVE
Glucose, UA: NEGATIVE mg/dL
Hgb urine dipstick: NEGATIVE
Ketones, ur: NEGATIVE mg/dL
Nitrite: NEGATIVE
Protein, ur: NEGATIVE mg/dL
Specific Gravity, Urine: 1.013 (ref 1.005–1.030)
pH: 6 (ref 5.0–8.0)

## 2023-09-27 NOTE — Plan of Care (Signed)

## 2023-09-27 NOTE — TOC Progression Note (Signed)
 Transition of Care [facility]) - Progression Note    Patient Details  Name: Carla Stokes MRN: [id number] Date of Birth: 1950/09/04  Transition of Care [facility]) CM/SW Contact  [name], [name]: 09/27/2023, 4:15 PM  Clinical Narrative:     Received psarr # [id number]  Expected Discharge Plan: Skilled Nursing Facility Barriers to Discharge: Insurance Authorization, Continued Medical Work up (SNF choice from family)               Expected Discharge Plan and Services In-house Referral: Clinical Social Work   Post Acute Care Choice: Skilled Nursing Facility Living arrangements for the past 2 months: Single Family Home                                       Social Drivers of Health (SDOH) Interventions SDOH Screenings   Food Insecurity: No Food Insecurity (09/21/2023)  Housing: Low Risk  (09/21/2023)  Transportation Needs: No Transportation Needs (09/21/2023)  Utilities: Not At Risk (09/21/2023)  Depression (PHQ2-9): Low Risk  (05/18/2022)  Social Connections: Moderately Integrated (09/21/2023)  Tobacco Use: Low Risk  (09/21/2023)    Readmission Risk Interventions     No data to display

## 2023-09-27 NOTE — TOC Progression Note (Addendum)
 Transition of Care [facility]) - Progression Note    Patient Details  Name: Michaele Amundson MRN: [id number] Date of Birth: 05-29-1950  [facility] of [facility]) CM/SW Contact  [name], [name]: 09/27/2023, 2:52 PM  Clinical Narrative:     CSW sent text of bed offers as requested by patient's daughter w/ medicare.gov ratings. CSW waiting on SNF choice form family. Including, providing SSN that is needed for PSARR approval .  Montie Louder, MSW, LCSW Clinical Social Worker    Expected Discharge Plan: Skilled Nursing Facility Barriers to Discharge: English as a second language teacher, Continued Medical Work up (SNF choice from family)               Expected Discharge Plan and Services In-house Referral: Clinical Social Work   Post Acute Care Choice: Skilled Nursing Facility Living arrangements for the past 2 months: Single Family Home                                       Social Drivers of Health (SDOH) Interventions SDOH Screenings   Food Insecurity: No Food Insecurity (09/21/2023)  Housing: Low Risk  (09/21/2023)  Transportation Needs: No Transportation Needs (09/21/2023)  Utilities: Not At Risk (09/21/2023)  Depression (PHQ2-9): Low Risk  (05/18/2022)  Social Connections: Moderately Integrated (09/21/2023)  Tobacco Use: Low Risk  (09/21/2023)    Readmission Risk Interventions     No data to display

## 2023-09-27 NOTE — Progress Notes (Signed)
   Providing Compassionate, Quality Care - Together   I spoke with the patient's daughter, Carla Stokes, regarding her concerns about her mother's confusion. A urinalysis has been ordered, along with some blood work to rule out infection.  Carla Stokes would like whoever is managing placement for Carla Stokes to communicate with her regarding discharge planning. She does not feel her mother is coherent enough presently to make these decisions. She was not aware that CIR was no longer an option. She can be reached at [phone] if she is not at the bedside.    Gerard Beck, DNP, AGNP-C Nurse Practitioner  [facility] [address], [place], [place] [address] P: [phone]    F: [phone]

## 2023-09-27 NOTE — Progress Notes (Signed)
 Subjective: The patient is alert and pleasant.  She looks better.  She is at times tearful.  Objective: Vital signs in last 24 hours: Temp:  [98.1 F (36.7 C)-98.7 F (37.1 C)] 98.1 F (36.7 C) (07/30 0735) Pulse Rate:  [61-77] 73 (07/30 0735) Resp:  [11-18] 15 (07/30 0735) BP: (122-160)/(63-79) 140/79 (07/30 0735) SpO2:  [91 %-97 %] 95 % (07/30 0735) Estimated body mass index is 36.77 kg/m as calculated from the following:   Height as of this encounter: 5' 7 (1.702 m).   Weight as of this encounter: 106.5 kg.   Intake/Output from previous day: 07/29 0701 - 07/30 0700 In: 360 [P.O.:360] Out: 650 [Urine:650] Intake/Output this shift: No intake/output data recorded.  Physical exam the patient is alert and oriented.  She is moving all 4 extremities well.  Her cervical incision is healing well.  Lab Results: No results for input(s): WBC, HGB, HCT, PLT in the last 72 hours. BMET No results for input(s): NA, K, CL, CO2, GLUCOSE, BUN, CREATININE, CALCIUM  in the last 72 hours.  Studies/Results: No results found.  Assessment/Plan: Postop day #6: We are awaiting skilled nursing facility placement.  LOS: 6 days     Carla Stokes 09/27/2023, 7:58 AM     Patient ID: Carla Stokes, female   DOB: Apr 07, 1950, 72 y.o.   MRN: [id number]

## 2023-09-27 NOTE — Progress Notes (Addendum)
 Occupational Therapy Treatment Patient Details Name: Carla Stokes MRN: [id number] DOB: 1950/09/04 Today's Date: 09/27/2023   History of present illness 73 yo female s/p C2-T1 decompressive laminectomy, posterior lateral arthrodesis, posterior segmental instrumentation C2-T1 bilat 7/24. 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   OT comments  Pt progressing toward goals, needing up to min A for toileting and pericare, pt min A for bed mobility and min A for transfers. Continues to need cues for safety as pt abandons RW and attempts to sit prematurely at times. Pt provided with adaptive utensil to improve self feeding with dominant RUE, and squeeze ball for BUE strengthening. Pt able demo use. Pt presenting with impairments listed below, will follow acutely. Patient will benefit from continued inpatient follow up therapy, <3 hours/day to maximize safety/ind with ADL/funcitonal mobility.        If plan is discharge home, recommend the following:  A lot of help with walking and/or transfers;A lot of help with bathing/dressing/bathroom;Assistance with cooking/housework;Direct supervision/assist for medications management;Direct supervision/assist for financial management;Assist for transportation;Help with stairs or ramp for entrance   Equipment Recommendations  Other (comment) (defer)    Recommendations for Other Services PT consult    Precautions / Restrictions Precautions Precautions: Fall;Cervical Precaution Booklet Issued: Yes (comment) Recall of Precautions/Restrictions: Impaired Precaution/Restrictions Comments: Reviewed when to don collar Required Braces or Orthoses: Cervical Brace Cervical Brace: Hard collar Restrictions Weight Bearing Restrictions Per Provider Order: No       Mobility Bed Mobility Overal bed mobility: Needs Assistance Bed Mobility: Sidelying to Sit Rolling: Min assist               Transfers Overall transfer level: Needs assistance Equipment used: Rolling walker (2 wheels) Transfers: Sit to/from Stand Sit to Stand: Min assist                 Balance Overall balance assessment: Needs assistance, History of Falls Sitting-balance support: No upper extremity supported, Feet supported Sitting balance-Leahy Scale: Fair     Standing balance support: During functional activity, Bilateral upper extremity supported, Reliant on assistive device for balance Standing balance-Leahy Scale: Poor Standing balance comment: Note LOB and need assist to correct                           ADL either performed or assessed with clinical judgement   ADL Overall ADL's : Needs assistance/impaired Eating/Feeding: With adaptive utensils;Set up;Sitting Eating/Feeding Details (indicate cue type and reason): self feeds peaches with built up spoon Grooming: Wash/dry hands;Sitting Grooming Details (indicate cue type and reason): standing at sink                 Toilet Transfer: Minimal assistance;Ambulation;Rolling walker (2 wheels)                  Extremity/Trunk Assessment Upper Extremity Assessment Upper Extremity Assessment: Generalized weakness RUE Deficits / Details: decr grasp and impaired coordiantion, incr numbness/tingling sensation compared to LUE RUE Sensation: decreased proprioception;decreased light touch RUE Coordination: decreased fine motor;decreased gross motor   Lower Extremity Assessment Lower Extremity Assessment: Defer to PT evaluation        Vision   Vision Assessment?: No apparent visual deficits   Perception Perception Perception: Not tested   Praxis Praxis Praxis: Not tested   Communication Communication Communication: No apparent difficulties   Cognition Arousal: Alert Behavior During Therapy: WFL for tasks assessed/performed, Lability  Cognition: Cognition impaired       Memory impairment (select all  impairments): Short-term memory     OT - Cognition Comments: emotional this session regarding hallucinations                 Following commands: Impaired Following commands impaired: Follows one step commands with increased time      Cueing   Cueing Techniques: Verbal cues, Gestural cues  Exercises Other Exercises Other Exercises: squeeze ball x10    Shoulder Instructions       General Comments VSS on RA    Pertinent Vitals/ Pain       Pain Assessment Pain Assessment: Faces Pain Score: 6  Faces Pain Scale: Hurts even more Pain Location: posterior neck Pain Descriptors / Indicators: Grimacing, Moaning, Sore Pain Intervention(s): Limited activity within patient's tolerance, Monitored during session, Repositioned  Home Living                                          Prior Functioning/Environment              Frequency  Min 2X/week        Progress Toward Goals  OT Goals(current goals can now be found in the care plan section)     Acute Rehab OT Goals Patient Stated Goal: to stop having hallucinations OT Goal Formulation: With patient Time For Goal Achievement: 10/07/23 Potential to Achieve Goals: Good  Plan      Co-evaluation                 AM-PAC OT 6 Clicks Daily Activity     Outcome Measure   Help from another person eating meals?: A Little Help from another person taking care of personal grooming?: A Little Help from another person toileting, which includes using toliet, bedpan, or urinal?: A Little Help from another person bathing (including washing, rinsing, drying)?: A Lot Help from another person to put on and taking off regular upper body clothing?: A Little Help from another person to put on and taking off regular lower body clothing?: A Lot 6 Click Score: 16    End of Session Equipment Utilized During Treatment: Gait belt;Rolling walker (2 wheels);Cervical collar  OT Visit Diagnosis: Unsteadiness on  feet (R26.81);Other abnormalities of gait and mobility (R26.89);Muscle weakness (generalized) (M62.81)   Activity Tolerance Patient tolerated treatment well   Patient Left in chair;with call bell/phone within reach;with chair alarm set   Nurse Communication Mobility status        Time: 1030-1109 OT Time Calculation (min): 39 min  Charges: OT General Charges $OT Visit: 1 Visit OT Treatments $Self Care/Home Management : 38-52 mins  Jaylene Schrom K, OTD, OTR/L SecureChat Preferred Acute Rehab [phone]   Del Wiseman K Koonce 09/27/2023, 12:20 PM

## 2023-09-27 NOTE — Inpatient Diabetes Management (Signed)
 Inpatient Diabetes Program Recommendations  AACE/ADA: New Consensus Statement on Inpatient Glycemic Control (2015)  Target Ranges:  Prepandial:   less than 140 mg/dL      Peak postprandial:   less than 180 mg/dL (1-2 hours)      Critically ill patients:  140 - 180 mg/dL   Lab Results  Component Value Date   GLUCAP 189 (H) 09/27/2023   HGBA1C 6.3 (H) 09/13/2023    Review of Glycemic Control  Latest Reference Range & Units 09/26/23 07:47 09/26/23 11:21 09/26/23 17:44 09/26/23 21:09 09/27/23 07:34 09/27/23 11:32  Glucose-Capillary 70 - 99 mg/dL 794 (H) 794 (H) 824 (H) 191 (H) 206 (H) 189 (H)  (H): Data is abnormally high  Diabetes history: DM2  Outpatient Diabetes medications: Toujeo  80 units every day, Ozempic weekly -Not taking Current orders for Inpatient glycemic control: Novolog  0-20 units TID and HS  Inpatient Diabetes Program Recommendations:    Please consider:  Semglee  12 units every day  Thank you, Wyvonna Pinal, MSN, CDCES Diabetes Coordinator Inpatient Diabetes Program [phone] (team pager from 8a-5p)

## 2023-09-27 NOTE — TOC Progression Note (Signed)
 Transition of Care [facility]) - Progression Note    Patient Details  Name: Carla Stokes MRN: [id number] Date of Birth: January 09, 1951  Transition of Care [facility]) CM/SW Contact  [name], [name]: 09/27/2023, 1:48 PM  Clinical Narrative:     10:30 am- CSW met with patient at bedside. CSW introduced self and explained role. CSW continued discussion of short term rehab at [facility]. Patient remains agreeable to SNF. CSW advised needs SSN. [name] was unable to fully recall SSN but advised her daughter will visiting with her today. CSW asked patient to have RN call CSW once her daughter arrived.   1:27 pm - CSW met with patient and family. CSW explained the SNF process and answered all questions. Family agreeable to CSW  moving forward with SNF search.   TOC will continue to follow and assist with discharge planning.   Montie Louder, MSW, LCSW Clinical Social Worker        Expected Discharge Plan: Skilled Nursing Facility Barriers to Discharge: Continued Medical Work up, English as a second language teacher, SNF Pending bed offer               Expected Discharge Plan and Services In-house Referral: Clinical Social Work   Post Acute Care Choice: Skilled Nursing Facility Living arrangements for the past 2 months: Single Family Home                                       Social Drivers of Health (SDOH) Interventions SDOH Screenings   Food Insecurity: No Food Insecurity (09/21/2023)  Housing: Low Risk  (09/21/2023)  Transportation Needs: No Transportation Needs (09/21/2023)  Utilities: Not At Risk (09/21/2023)  Depression (PHQ2-9): Low Risk  (05/18/2022)  Social Connections: Moderately Integrated (09/21/2023)  Tobacco Use: Low Risk  (09/21/2023)    Readmission Risk Interventions     No data to display

## 2023-09-27 NOTE — Progress Notes (Signed)
 Physical Therapy Treatment Patient Details Name: Carla Stokes MRN: [id number] DOB: 06/04/1950 Today's Date: 09/27/2023   History of Present Illness 73 yo female s/p C2-T1 decompressive laminectomy, posterior lateral arthrodesis, posterior segmental instrumentation C2-T1 bilat 7/24. 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    PT Comments  The pt is making good, gradual functional progress as she is now only needing minA for bed mobility (when using bed features) and transfers. She does need repeated education to recall her cervical precautions and repeated cuing to sequence mobility while complying to her precautions. She demonstrated an ability to maintain better proximity to her RW when ambulating a straight path today, but often pushed it distally to her again whenever she turned. She continues to display bouts of LOB when ambulating with a RW, needing minA to recover and maintain her balance. She is at high risk for falls at this time. Will continue to follow acutely. Coordinated with TOC and AIR Coordinators after trying to coordinate with pt and her daughter on d/c options as daughter was initially upset that pt was planning to d/c to a SNF for rehab than to AIR. TOC reporting family has decided to continue to pursue SNF rather than AIR now.   If plan is discharge home, recommend the following: A little help with bathing/dressing/bathroom;A little help with walking and/or transfers;Assistance with cooking/housework;Help with stairs or ramp for entrance;Supervision due to cognitive status;Assist for transportation   Can travel by private vehicle     Yes  Equipment Recommendations  BSC/3in1    Recommendations for Other Services       Precautions / Restrictions Precautions Precautions: Fall;Cervical Precaution Booklet Issued: Yes (comment) Recall of Precautions/Restrictions: Impaired Precaution/Restrictions Comments:  reviewed BLT Required Braces or Orthoses: Cervical Brace Cervical Brace: Hard collar (apply/remove while sitting) Restrictions Weight Bearing Restrictions Per Provider Order: No     Mobility  Bed Mobility Overal bed mobility: Needs Assistance Bed Mobility: Rolling, Sidelying to Sit, Sit to Sidelying Rolling: Min assist, Used rails Sidelying to sit: Min assist, HOB elevated, Used rails     Sit to sidelying: Min assist, HOB elevated General bed mobility comments: Cues provided to log roll then get L elbow under her trunk and bring legs off L EOB to sit up, minA at trunk needed to complete transition. Cuing needed to lean laterally to L elbow to descend trunk along with minA needed at legs to lift back onto bed  with transition sit to sidelying.    Transfers Overall transfer level: Needs assistance Equipment used: Rolling walker (2 wheels) Transfers: Sit to/from Stand Sit to Stand: Min assist           General transfer comment: Pt displayed good initiation to scoot to edge of seats, but needed reminders for hand placement when performing transfers. MinA needed to power up to stand from EOB 1x and commode 1x.    Ambulation/Gait Ambulation/Gait assistance: Min assist Gait Distance (Feet): 220 Feet (x2 bouts of ~15 ft > ~220 ft) Assistive device: Rolling walker (2 wheels) Gait Pattern/deviations: Step-through pattern, Decreased stride length, Trunk flexed, Drifts right/left Gait velocity: reduced Gait velocity interpretation: <1.8 ft/sec, indicate of risk for recurrent falls   General Gait Details: Pt ambulates with a flexed posture and with a tendency to sway/drift/stagger laterally, needing minA for balance. Repeated verbal and tactile cues provided to stand upright while ambulating. Cues also needed to remain proximal to RW, good carryover when on  straight path when pt was cued to keep hips beside back legs of RW, but when turning she would push the RW distal to her  again.   Stairs             Wheelchair Mobility     Tilt Bed    Modified Rankin (Stroke Patients Only)       Balance Overall balance assessment: Needs assistance Sitting-balance support: No upper extremity supported, Feet supported Sitting balance-Leahy Scale: Fair Sitting balance - Comments: static sitting EOB with supervision for safety   Standing balance support: Bilateral upper extremity supported, During functional activity, Reliant on assistive device for balance Standing balance-Leahy Scale: Poor Standing balance comment: LOB bouts noted when ambulating, needing minA to recover and maintain her balance while ambulating with RW support                            Communication Communication Communication: No apparent difficulties  Cognition Arousal: Alert Behavior During Therapy: Lability   PT - Cognitive impairments: Problem solving, Safety/Judgement, Sequencing, Memory, Attention                       PT - Cognition Comments: Delayed processing and needs repeated cues to remain proximal within her RW to improve her safety. Pt emotional in regards to her current deficits and desire to be independent and not be a burden on anyone. Following commands: Impaired Following commands impaired: Follows one step commands with increased time    Cueing Cueing Techniques: Verbal cues, Tactile cues  Exercises      General Comments General comments (skin integrity, edema, etc.): Daughter asking many questions about d/c options, expressing frustration that MD mentioned pt would go to AIR but now pt appears to be planning to d/c to SNF. Answered daughter's questions about d/c options and RN already contacted MD and CM/SW about daughter's request to be brought into this conversation. Explained to daughter that AIR typically requires 24/7 care availability upon d/c home, attempted to obtain answer whether pt would have this available or not upon d/c to see  if AIR could be pursued, but daughter often provided vague, non-direct answers to this question. Repeated it several times in various ways while repeating why this information was important for AIR with daughter eventually stating maybe, I don't know and got up and started yelling and cussing in the hall, apparently frustrated by the attempt to gather information. RN present and aware of situation.      Pertinent Vitals/Pain Pain Assessment Pain Assessment: Faces Faces Pain Scale: Hurts even more Pain Location: posterior neck Pain Descriptors / Indicators: Discomfort, Grimacing, Guarding, Operative site guarding, Moaning, Sore Pain Intervention(s): Limited activity within patient's tolerance, Monitored during session, Repositioned, RN gave pain meds during session    Home Living                          Prior Function            PT Goals (current goals can now be found in the care plan section) Acute Rehab PT Goals Patient Stated Goal: return to independence PT Goal Formulation: With patient/family Time For Goal Achievement: 10/06/23 Potential to Achieve Goals: Good Progress towards PT goals: Progressing toward goals    Frequency    Min 5X/week      PT Plan      Co-evaluation  AM-PAC PT 6 Clicks Mobility   Outcome Measure  Help needed turning from your back to your side while in a flat bed without using bedrails?: A Little Help needed moving from lying on your back to sitting on the side of a flat bed without using bedrails?: A Little Help needed moving to and from a bed to a chair (including a wheelchair)?: A Little Help needed standing up from a chair using your arms (e.g., wheelchair or bedside chair)?: A Little Help needed to walk in hospital room?: A Little Help needed climbing 3-5 steps with a railing? : Total 6 Click Score: 16    End of Session Equipment Utilized During Treatment: Cervical collar;Gait belt Activity Tolerance:  Patient tolerated treatment well Patient left: with call bell/phone within reach;with family/visitor present;in bed;with bed alarm set Nurse Communication: Mobility status;Other (comment) (daughter upset and cussing about therapist's attempt to gather home info, RN present for this) PT Visit Diagnosis: Other abnormalities of gait and mobility (R26.89);Muscle weakness (generalized) (M62.81);Unsteadiness on feet (R26.81);Difficulty in walking, not elsewhere classified (R26.2)     Time: 8766-8684 PT Time Calculation (min) (ACUTE ONLY): 42 min  Charges:    $Gait Training: 8-22 mins $Therapeutic Activity: 23-37 mins PT General Charges $$ ACUTE PT VISIT: 1 Visit                     Theo Ferretti, PT, DPT Acute Rehabilitation Services  Office: [phone]    Theo CHRISTELLA Ferretti 09/27/2023, 5:17 PM

## 2023-09-28 LAB — POCT I-STAT 7, (LYTES, BLD GAS, ICA,H+H)
Acid-base deficit: 2 mmol/L (ref 0.0–2.0)
Bicarbonate: 22.1 mmol/L (ref 20.0–28.0)
Calcium, Ion: 1.18 mmol/L (ref 1.15–1.40)
HCT: 30 % — ABNORMAL LOW (ref 36.0–46.0)
Hemoglobin: 10.2 g/dL — ABNORMAL LOW (ref 12.0–15.0)
O2 Saturation: 100 %
Patient temperature: 35.8
Potassium: 3.1 mmol/L — ABNORMAL LOW (ref 3.5–5.1)
Sodium: 146 mmol/L — ABNORMAL HIGH (ref 135–145)
TCO2: 23 mmol/L (ref 22–32)
pCO2 arterial: 34.1 mmHg (ref 32–48)
pH, Arterial: 7.414 (ref 7.35–7.45)
pO2, Arterial: 246 mmHg — ABNORMAL HIGH (ref 83–108)

## 2023-09-28 LAB — GLUCOSE, CAPILLARY
Glucose-Capillary: 190 mg/dL — ABNORMAL HIGH (ref 70–99)
Glucose-Capillary: 164 mg/dL — ABNORMAL HIGH (ref 70–99)
Glucose-Capillary: 234 mg/dL — ABNORMAL HIGH (ref 70–99)
Glucose-Capillary: 186 mg/dL — ABNORMAL HIGH (ref 70–99)

## 2023-09-28 MED ORDER — OXYCODONE-ACETAMINOPHEN 5-325 MG PO TABS
1.0000 | ORAL_TABLET | ORAL | Status: DC | PRN
  Administered 2023-09-28 (×3): 2 via ORAL
  Administered 2023-09-28 (×2): 1 via ORAL
  Administered 2023-09-29 – 2023-10-01 (×8): 2 via ORAL
  Filled 2023-09-28 (×3): qty 2
  Filled 2023-09-28: qty 1
  Filled 2023-09-28 (×7): qty 2
  Filled 2023-09-28: qty 1
  Filled 2023-09-28 (×2): qty 2

## 2023-09-28 NOTE — Progress Notes (Signed)
 Physical Therapy Treatment Patient Details Name: Carla Stokes MRN: [id number] DOB: 1950/07/04 Today's Date: 09/28/2023   History of Present Illness 73 yo female s/p C2-T1 decompressive laminectomy, posterior lateral arthrodesis, posterior segmental instrumentation C2-T1 bilat 7/24. 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    PT Comments  Pt in recliner at the start of the session and is agreeable to therapy. Pt re-educated on the use of the cervical collar. Pt required more assist to stand and ambulate due to increased pain. Needed repeated cueing for walker management. Pt expected to progress functional mobility when pain is managed. PT to follow acutely.    If plan is discharge home, recommend the following: A little help with bathing/dressing/bathroom;A little help with walking and/or transfers;Assistance with cooking/housework;Help with stairs or ramp for entrance;Supervision due to cognitive status;Assist for transportation   Can travel by private vehicle     Yes  Equipment Recommendations  BSC/3in1    Recommendations for Other Services       Precautions / Restrictions Precautions Precautions: Fall;Cervical Precaution Booklet Issued: Yes (comment) Recall of Precautions/Restrictions: Impaired Precaution/Restrictions Comments: Educated on use of brace OOB Required Braces or Orthoses: Cervical Brace Cervical Brace: Hard collar Restrictions Weight Bearing Restrictions Per Provider Order: No     Mobility  Bed Mobility Overal bed mobility: Needs Assistance Bed Mobility: Rolling, Sit to Sidelying Rolling: Contact guard assist       Sit to sidelying: Min assist General bed mobility comments: VC for log rolling into bed, leading with shoulder and elbow and turning as a whole. Min A for LE management    Transfers Overall transfer level: Needs assistance Equipment used: Rolling walker (2  wheels) Transfers: Sit to/from Stand Sit to Stand: Mod assist           General transfer comment: Pt had good initiation to scoot to edge of seat. VC for hand placement for RW. Mod A for power up to stand, assist with belt and sacrum.    Ambulation/Gait Ambulation/Gait assistance: Min assist Gait Distance (Feet): 25 Feet Assistive device: Rolling walker (2 wheels) Gait Pattern/deviations: Step-through pattern, Decreased stride length, Trunk flexed Gait velocity: reduced     General Gait Details: Pt ambulates with flexed posture.. Repeated verbal and tacticle cues for upright and staying close to walker. Min A for walker management so pt can remain close and for steadying.   Stairs             Wheelchair Mobility     Tilt Bed    Modified Rankin (Stroke Patients Only)       Balance Overall balance assessment: Needs assistance Sitting-balance support: Feet supported, Single extremity supported Sitting balance-Leahy Scale: Good Sitting balance - Comments: Static sitting EOB w/ supervision. Able weight shift laterally   Standing balance support: Bilateral upper extremity supported, During functional activity, Reliant on assistive device for balance Standing balance-Leahy Scale: Poor Standing balance comment: Min A for steadying while standing and use of RW for support                            Communication Communication Communication: No apparent difficulties  Cognition Arousal: Alert Behavior During Therapy: Lability   PT - Cognitive impairments: Problem solving, Safety/Judgement, Sequencing, Attention                       PT - Cognition Comments: Required  repeated cues to stay close to RW for safety and upright. Repeated cries of pain and not wanting to move around due to pain Following commands: Impaired Following commands impaired: Follows one step commands with increased time    Cueing Cueing Techniques: Verbal cues, Tactile cues   Exercises      General Comments General comments (skin integrity, edema, etc.): VSS on RA. Repeated education on cervical precautions and collar use.      Pertinent Vitals/Pain Pain Assessment Pain Assessment: Faces Faces Pain Scale: Hurts whole lot Pain Location: Neck, head Pain Descriptors / Indicators: Discomfort, Grimacing, Guarding, Moaning Pain Intervention(s): Limited activity within patient's tolerance, Monitored during session, RN gave pain meds during session    Home Living                          Prior Function            PT Goals (current goals can now be found in the care plan section) Acute Rehab PT Goals Patient Stated Goal: return to independence PT Goal Formulation: With patient/family Time For Goal Achievement: 10/06/23 Potential to Achieve Goals: Good Progress towards PT goals: Progressing toward goals    Frequency    Min 5X/week      PT Plan      Co-evaluation              AM-PAC PT 6 Clicks Mobility   Outcome Measure  Help needed turning from your back to your side while in a flat bed without using bedrails?: A Little Help needed moving from lying on your back to sitting on the side of a flat bed without using bedrails?: A Little Help needed moving to and from a bed to a chair (including a wheelchair)?: A Little Help needed standing up from a chair using your arms (e.g., wheelchair or bedside chair)?: A Lot Help needed to walk in hospital room?: A Little Help needed climbing 3-5 steps with a railing? : Total 6 Click Score: 15    End of Session Equipment Utilized During Treatment: Cervical collar;Gait belt Activity Tolerance: Patient limited by pain Patient left: in bed;with bed alarm set;with call bell/phone within reach Nurse Communication: Mobility status PT Visit Diagnosis: Other abnormalities of gait and mobility (R26.89);Muscle weakness (generalized) (M62.81);Unsteadiness on feet (R26.81);Difficulty in walking,  not elsewhere classified (R26.2)     Time: 9096-9076 PT Time Calculation (min) (ACUTE ONLY): 20 min  Charges:    $Therapeutic Activity: 8-22 mins PT General Charges $$ ACUTE PT VISIT: 1 Visit                     Quintin Campi, SPT  Acute Rehab  [phone]    Quintin Campi 09/28/2023, 10:42 AM

## 2023-09-28 NOTE — Progress Notes (Signed)
 Subjective: The patient is alert and pleasant.  She continues to be tearful at times.  She complains of appropriate neck soreness.  Objective: Vital signs in last 24 hours: Temp:  [97.8 F (36.6 C)-99 F (37.2 C)] 97.8 F (36.6 C) (07/31 0259) Pulse Rate:  [63-74] 68 (07/31 0259) Resp:  [18] 18 (07/30 1925) BP: (117-164)/(67-91) 149/67 (07/31 0259) SpO2:  [95 %-97 %] 97 % (07/31 0259) Estimated body mass index is 36.77 kg/m as calculated from the following:   Height as of this encounter: 5' 7 (1.702 m).   Weight as of this encounter: 106.5 kg.   Intake/Output from previous day: 07/30 0701 - 07/31 0700 In: -  Out: 450 [Urine:450] Intake/Output this shift: No intake/output data recorded.  Physical exam patient is alert and oriented.  She is moving all 4 extremities well.  Her cervical wound is healing well.  Lab Results: Recent Labs    09/27/23 1422  WBC 5.5  HGB 11.4*  HCT 33.5*  PLT 207   BMET Recent Labs    09/27/23 1422  NA 138  K 3.1*  CL 98  CO2 31  GLUCOSE 198*  BUN 17  CREATININE 1.09*  CALCIUM  8.8*    Studies/Results: No results found.  Assessment/Plan: Postop day #7: The patient is ready for skilled nursing facility placement.  We will saline lock her IV and convert her to p.o. pain medications.  LOS: 7 days     Carla Stokes 09/28/2023, 7:58 AM     Patient ID: Carla Stokes, female   DOB: Feb 05, 1951, 73 y.o.   MRN: [id number]

## 2023-09-28 NOTE — TOC Progression Note (Signed)
 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   Expected Discharge Plan: Skilled Nursing Facility Barriers to Discharge: Insurance Authorization               Expected Discharge Plan and Services In-house Referral: Clinical Social Work   Post Acute Care Choice: Skilled Nursing Facility Living arrangements for the past 2 months: Single Family Home                                       Social Drivers of Health (SDOH) Interventions SDOH Screenings   Food Insecurity: No Food Insecurity (09/21/2023)  Housing: Low Risk  (09/21/2023)  Transportation Needs: No Transportation Needs (09/21/2023)  Utilities: Not At Risk (09/21/2023)  Depression (PHQ2-9): Low Risk  (05/18/2022)  Social Connections: Moderately Integrated (09/21/2023)  Tobacco Use: Low Risk  (09/21/2023)    Readmission Risk Interventions     No data to display

## 2023-09-28 NOTE — TOC Progression Note (Signed)
 Transition of Care [facility]) - Progression Note    Patient Details  Name: Carla Stokes MRN: [id number] Date of Birth: [birth year]  Transition of Care [facility]) CM/SW Contact  [name], [name]: 09/28/2023, 11:06 AM  Clinical Narrative:     Received message form patient's daughter, preferred SNF choice is [facility]   [facility] confirmed bed availability Sent request to CMA to start authorization for SNF.   TOC will continue to follow and assist with discharge planning.  Montie Louder, MSW, LCSW Clinical Social Worker    Expected Discharge Plan: Skilled Nursing Facility Barriers to Discharge: Insurance Authorization               Expected Discharge Plan and Services In-house Referral: Clinical Social Work   Post Acute Care Choice: Skilled Nursing Facility Living arrangements for the past 2 months: Single Family Home                                       Social Drivers of Health (SDOH) Interventions SDOH Screenings   Food Insecurity: No Food Insecurity (09/21/2023)  Housing: Low Risk  (09/21/2023)  Transportation Needs: No Transportation Needs (09/21/2023)  Utilities: Not At Risk (09/21/2023)  Depression (PHQ2-9): Low Risk  (05/18/2022)  Social Connections: Moderately Integrated (09/21/2023)  Tobacco Use: Low Risk  (09/21/2023)    Readmission Risk Interventions     No data to display

## 2023-09-29 LAB — GLUCOSE, CAPILLARY
Glucose-Capillary: 211 mg/dL — ABNORMAL HIGH (ref 70–99)
Glucose-Capillary: 225 mg/dL — ABNORMAL HIGH (ref 70–99)
Glucose-Capillary: 169 mg/dL — ABNORMAL HIGH (ref 70–99)
Glucose-Capillary: 176 mg/dL — ABNORMAL HIGH (ref 70–99)

## 2023-09-29 MED ORDER — SULFAMETHOXAZOLE-TRIMETHOPRIM 800-160 MG PO TABS
1.0000 | ORAL_TABLET | Freq: Two times a day (BID) | ORAL | Status: AC
  Administered 2023-09-29 – 2023-10-01 (×6): 1 via ORAL
  Filled 2023-09-29 (×7): qty 1

## 2023-09-29 NOTE — Progress Notes (Signed)
 Subjective: The patient is alert and pleasant.  She is still very slow to mobilize.  Objective: Vital signs in last 24 hours: Temp:  [97.7 F (36.5 C)-98.5 F (36.9 C)] 97.7 F (36.5 C) (08/01 0225) Pulse Rate:  [56-70] 60 (08/01 0225) Resp:  [16-18] 16 (08/01 0225) BP: (117-164)/(56-76) 117/56 (08/01 0225) SpO2:  [92 %-97 %] 96 % (08/01 0225) Estimated body mass index is 36.77 kg/m as calculated from the following:   Height as of this encounter: 5' 7 (1.702 m).   Weight as of this encounter: 106.5 kg.   Intake/Output from previous day: 07/31 0701 - 08/01 0700 In: 360 [P.O.:360] Out: 600 [Urine:600] Intake/Output this shift: No intake/output data recorded.  Physical exam the patient is alert and oriented.  She is moving all 4 extremities well.  Incision is healing well.  Her urinalysis was equivocal.  Lab Results: Recent Labs    09/27/23 1422  WBC 5.5  HGB 11.4*  HCT 33.5*  PLT 207   BMET Recent Labs    09/27/23 1422  NA 138  K 3.1*  CL 98  CO2 31  GLUCOSE 198*  BUN 17  CREATININE 1.09*  CALCIUM  8.8*    Studies/Results: No results found.  Assessment/Plan: Postop day #8: The patient is awaiting a skilled nursing facility bed.  She has been ready for discharge for days.  Abnormal UA: I will start empiric Bactrim .  LOS: 8 days     [name] 09/29/2023, 7:01 AM     Patient ID: Carla Stokes, female   DOB: 06-09-1950, 72 y.o.   MRN: [id number]

## 2023-09-29 NOTE — Progress Notes (Signed)
 Occupational Therapy Treatment Patient Details Name: Carla Stokes MRN: [id number] DOB: 10/12/1950 Today's Date: 09/29/2023   History of present illness 73 yo female s/p C2-T1 decompressive laminectomy, posterior lateral arthrodesis, posterior segmental instrumentation C2-T1 bilat 7/24. 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   OT comments  Patient upset this am, worried about being a burden to family.  OT tried to ensure her she would return to her baseline.  Generalized CGA for in room mobility/toileting and stand grooming.  Still needing Mod A for lower body ADL, cues for cervical precautions.  OT will continue efforts in the acute setting and Patient will benefit from continued inpatient follow up therapy, <3 hours/day.      If plan is discharge home, recommend the following:  A lot of help with walking and/or transfers;A lot of help with bathing/dressing/bathroom;Assistance with cooking/housework;Direct supervision/assist for medications management;Direct supervision/assist for financial management;Assist for transportation;Help with stairs or ramp for entrance   Equipment Recommendations       Recommendations for Other Services      Precautions / Restrictions Precautions Precautions: Fall;Cervical Precaution Booklet Issued: Yes (comment) Recall of Precautions/Restrictions: Impaired Precaution/Restrictions Comments: Educated on use of brace OOB Required Braces or Orthoses: Cervical Brace Cervical Brace: Hard collar Restrictions Weight Bearing Restrictions Per Provider Order: No       Mobility Bed Mobility Overal bed mobility: Needs Assistance Bed Mobility: Supine to Sit     Supine to sit: Min assist          Transfers Overall transfer level: Needs assistance Equipment used: Rolling walker (2 wheels) Transfers: Sit to/from Stand Sit to Stand: Contact guard assist     Step pivot transfers:  Contact guard assist           Balance Overall balance assessment: Needs assistance Sitting-balance support: Feet supported, Single extremity supported Sitting balance-Leahy Scale: Good     Standing balance support: Reliant on assistive device for balance Standing balance-Leahy Scale: Poor                             ADL either performed or assessed with clinical judgement   ADL   Eating/Feeding: With adaptive utensils;Set up;Sitting   Grooming: Wash/dry hands;Wash/dry face;Oral care;Supervision/safety;Standing           Upper Body Dressing : Minimal assistance;Sitting   Lower Body Dressing: Moderate assistance;Sit to/from stand   Toilet Transfer: Contact guard assist;Regular Toilet;Rolling walker (2 wheels)                  Extremity/Trunk Assessment Upper Extremity Assessment RUE Deficits / Details: decr grasp and impaired coordiantion, incr numbness/tingling sensation compared to LUE RUE Sensation: decreased proprioception;decreased light touch RUE Coordination: decreased fine motor;decreased gross motor   Lower Extremity Assessment Lower Extremity Assessment: Defer to PT evaluation   Cervical / Trunk Assessment Cervical / Trunk Assessment: Neck Surgery    Vision Patient Visual Report: No change from baseline     Perception Perception Perception: Not tested   Praxis Praxis Praxis: Not tested   Communication Communication Communication: No apparent difficulties   Cognition Arousal: Alert Behavior During Therapy: Lability Cognition: Cognition impaired       Memory impairment (select all impairments): Short-term memory     OT - Cognition Comments: emotional                 Following commands: Impaired Following commands  impaired: Follows one step commands with increased time      Cueing   Cueing Techniques: Verbal cues, Tactile cues  Exercises      Shoulder Instructions       General Comments  VSS      Pertinent Vitals/ Pain       Pain Assessment Pain Assessment: No/denies pain                                                          Frequency  Min 2X/week        Progress Toward Goals  OT Goals(current goals can now be found in the care plan section)  Progress towards OT goals: Progressing toward goals  Acute Rehab OT Goals OT Goal Formulation: With patient Time For Goal Achievement: 10/07/23 Potential to Achieve Goals: Good  Plan      Co-evaluation                 AM-PAC OT 6 Clicks Daily Activity     Outcome Measure   Help from another person eating meals?: A Little Help from another person taking care of personal grooming?: A Little Help from another person toileting, which includes using toliet, bedpan, or urinal?: A Little Help from another person bathing (including washing, rinsing, drying)?: A Lot Help from another person to put on and taking off regular upper body clothing?: A Little Help from another person to put on and taking off regular lower body clothing?: A Lot 6 Click Score: 16    End of Session Equipment Utilized During Treatment: Gait belt  OT Visit Diagnosis: Unsteadiness on feet (R26.81);Other abnormalities of gait and mobility (R26.89);Muscle weakness (generalized) (M62.81)   Activity Tolerance Patient tolerated treatment well   Patient Left in chair;with call bell/phone within reach;with chair alarm set   Nurse Communication          Time: [phone] OT Time Calculation (min): 21 min  Charges: OT General Charges $OT Visit: 1 Visit OT Treatments $Self Care/Home Management : 8-22 mins  09/29/2023  RP, OTR/L  Acute Rehabilitation Services  Office:  [phone]   Carla Stokes 09/29/2023, 8:42 AM

## 2023-09-29 NOTE — TOC Progression Note (Signed)
 Transition of Care [facility]) - Progression Note    Patient Details  Name: Carla Stokes MRN: [id number] Date of Birth: Mar 13, 1950  Transition of Care [facility]) CM/SW Contact  [name], [name]: 09/29/2023, 4:21 PM  Clinical Narrative:     Insurance is still pending   Expected Discharge Plan: Skilled Nursing Facility Barriers to Discharge: English as a second language teacher               Expected Discharge Plan and Services In-house Referral: Clinical Social Work   Post Acute Care Choice: Skilled Nursing Facility Living arrangements for the past 2 months: Single Family Home                                       Social Drivers of Health (SDOH) Interventions SDOH Screenings   Food Insecurity: No Food Insecurity (09/21/2023)  Housing: Low Risk  (09/21/2023)  Transportation Needs: No Transportation Needs (09/21/2023)  Utilities: Not At Risk (09/21/2023)  Depression (PHQ2-9): Low Risk  (05/18/2022)  Social Connections: Moderately Integrated (09/21/2023)  Tobacco Use: Low Risk  (09/21/2023)    Readmission Risk Interventions     No data to display

## 2023-09-29 NOTE — TOC Progression Note (Signed)
 Transition of Care [facility]) - Progression Note    Patient Details  Name: Carla Stokes MRN: [id number] Date of Birth: [birth year]  Transition of Care [facility]) CM/SW Contact  [name], [name]: 09/29/2023, 9:34 AM  Clinical Narrative:     Insurance still pending   Expected Discharge Plan: Skilled Nursing Facility Barriers to Discharge: English as a second language teacher               Expected Discharge Plan and Services In-house Referral: Clinical Social Work   Post Acute Care Choice: Skilled Nursing Facility Living arrangements for the past 2 months: Single Family Home                                       Social Drivers of Health (SDOH) Interventions SDOH Screenings   Food Insecurity: No Food Insecurity (09/21/2023)  Housing: Low Risk  (09/21/2023)  Transportation Needs: No Transportation Needs (09/21/2023)  Utilities: Not At Risk (09/21/2023)  Depression (PHQ2-9): Low Risk  (05/18/2022)  Social Connections: Moderately Integrated (09/21/2023)  Tobacco Use: Low Risk  (09/21/2023)    Readmission Risk Interventions     No data to display

## 2023-09-29 NOTE — Plan of Care (Signed)

## 2023-09-29 NOTE — Progress Notes (Signed)
 Physical Therapy Treatment Patient Details Name: Carla Stokes MRN: [id number] DOB: [birth year] Today's Date: 09/29/2023   History of Present Illness 73 yo female s/p C2-T1 decompressive laminectomy, posterior lateral arthrodesis, posterior segmental instrumentation C2-T1 bilat 7/24. 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    PT Comments  Pt is progressing her mobility with her pain more managed. Her functional endurance increased as she was able to ambulate further with minimal standing breaks. Pt requires repeated cueing for upright and staying close to her walker. Pt tends to push RW further away when she is turning and needs increased cueing for safety. Pt is expected to progress with functional mobility as pain decreases. PT to follow acutely.     If plan is discharge home, recommend the following: A little help with bathing/dressing/bathroom;A little help with walking and/or transfers;Assistance with cooking/housework;Help with stairs or ramp for entrance;Supervision due to cognitive status;Assist for transportation   Can travel by private vehicle        Equipment Recommendations  BSC/3in1    Recommendations for Other Services       Precautions / Restrictions Precautions Precautions: Fall;Cervical Precaution Booklet Issued: Yes (comment) Recall of Precautions/Restrictions: Impaired Precaution/Restrictions Comments: Educated on use of brace OOB and okay to have brace taken off in chair as long as she does not move neck around Required Braces or Orthoses: Cervical Brace Cervical Brace: Hard collar Restrictions Weight Bearing Restrictions Per Provider Order: No     Mobility  Bed Mobility               General bed mobility comments: In recliner at start and end of session    Transfers Overall transfer level: Needs assistance Equipment used: Rolling walker (2 wheels) Transfers: Sit to/from  Stand Sit to Stand: Min assist           General transfer comment: Pt had good initiation to scoot to edge of seat. Used BUE to push off recliner. Min A for power up to stand, assist with belt and sacrum.    Ambulation/Gait Ambulation/Gait assistance: Min assist Gait Distance (Feet): 250 Feet Assistive device: Rolling walker (2 wheels) Gait Pattern/deviations: Step-through pattern, Decreased stride length, Trunk flexed Gait velocity: reduced     General Gait Details: Pt ambulates with flexed posture.. Repeated verbal and tacticle cues for upright and staying close to walker. Min A for walker management so pt can remain close and for steadying.   Stairs             Wheelchair Mobility     Tilt Bed    Modified Rankin (Stroke Patients Only)       Balance Overall balance assessment: Needs assistance Sitting-balance support: Feet supported, Single extremity supported Sitting balance-Leahy Scale: Good     Standing balance support: Reliant on assistive device for balance Standing balance-Leahy Scale: Poor Standing balance comment: Min A for steadying while standing and use of RW for support                            Communication Communication Communication: No apparent difficulties  Cognition Arousal: Alert Behavior During Therapy: Lability   PT - Cognitive impairments: Problem solving, Safety/Judgement, Sequencing, Attention                       PT - Cognition Comments: Required repeated cues to stay close to RW for safety  and upright Following commands: Impaired Following commands impaired: Follows one step commands with increased time    Cueing Cueing Techniques: Verbal cues, Tactile cues, Gestural cues  Exercises      General Comments General comments (skin integrity, edema, etc.): VSS on RA      Pertinent Vitals/Pain Pain Assessment Pain Assessment: Faces Faces Pain Scale: Hurts little more Pain Location: Neck, head Pain  Descriptors / Indicators: Discomfort, Grimacing, Guarding, Moaning Pain Intervention(s): Limited activity within patient's tolerance, Monitored during session, Premedicated before session    Home Living                          Prior Function            PT Goals (current goals can now be found in the care plan section) Acute Rehab PT Goals Patient Stated Goal: return to independence PT Goal Formulation: With patient/family Time For Goal Achievement: 10/06/23 Potential to Achieve Goals: Good Progress towards PT goals: Progressing toward goals    Frequency    Min 5X/week      PT Plan      Co-evaluation              AM-PAC PT 6 Clicks Mobility   Outcome Measure  Help needed turning from your back to your side while in a flat bed without using bedrails?: A Little Help needed moving from lying on your back to sitting on the side of a flat bed without using bedrails?: A Little Help needed moving to and from a bed to a chair (including a wheelchair)?: A Little Help needed standing up from a chair using your arms (e.g., wheelchair or bedside chair)?: A Little Help needed to walk in hospital room?: A Little Help needed climbing 3-5 steps with a railing? : Total 6 Click Score: 16    End of Session Equipment Utilized During Treatment: Cervical collar;Gait belt Activity Tolerance: Patient tolerated treatment well Patient left: with call bell/phone within reach;in chair;with chair alarm set Nurse Communication: Mobility status (Notified RN that pt wants to bathe) PT Visit Diagnosis: Other abnormalities of gait and mobility (R26.89);Muscle weakness (generalized) (M62.81);Unsteadiness on feet (R26.81);Difficulty in walking, not elsewhere classified (R26.2)     Time: 9162-9097 PT Time Calculation (min) (ACUTE ONLY): 25 min  Charges:    $Gait Training: 8-22 mins PT General Charges $$ ACUTE PT VISIT: 1 Visit                     Quintin Campi, SPT  Acute  Rehab  [phone]    Quintin Campi 09/29/2023, 12:10 PM

## 2023-09-30 LAB — GLUCOSE, CAPILLARY
Glucose-Capillary: 179 mg/dL — ABNORMAL HIGH (ref 70–99)
Glucose-Capillary: 234 mg/dL — ABNORMAL HIGH (ref 70–99)
Glucose-Capillary: 156 mg/dL — ABNORMAL HIGH (ref 70–99)
Glucose-Capillary: 153 mg/dL — ABNORMAL HIGH (ref 70–99)

## 2023-09-30 MED ORDER — OXYCODONE-ACETAMINOPHEN 5-325 MG PO TABS: 1.0000 | ORAL_TABLET | ORAL | 0 refills | Status: DC | PRN

## 2023-09-30 NOTE — Progress Notes (Signed)
 Occupational Therapy Treatment Patient Details Name: Carla Stokes MRN: [id number] DOB: September 16, 1950 Today's Date: 09/30/2023   History of present illness 73 yo female s/p C2-T1 decompressive laminectomy, posterior lateral arthrodesis, posterior segmental instrumentation C2-T1 bilat 7/24. 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   OT comments  Pt. Seen for skilled OT treatment session.  Pt. Able to complete bed mobility with CGA.  Ambulation to/from b.room with CGA.  Toilet and standing grooming task with CGA/S.  Cues throughout session for cervical precautions during mobility and ADLs.  Agree with d/c recommendations. Cont. With acute OT POC.        If plan is discharge home, recommend the following:  A lot of help with walking and/or transfers;A lot of help with bathing/dressing/bathroom;Assistance with cooking/housework;Direct supervision/assist for medications management;Direct supervision/assist for financial management;Assist for transportation;Help with stairs or ramp for entrance   Equipment Recommendations       Recommendations for Other Services      Precautions / Restrictions Precautions Precautions: Fall;Cervical Recall of Precautions/Restrictions: Impaired Precaution/Restrictions Comments: brace donned in sitting, can be removed for short walk to bathroom, and for bathing/showering Required Braces or Orthoses: Cervical Brace Cervical Brace: Hard collar       Mobility Bed Mobility Overal bed mobility: Needs Assistance Bed Mobility: Rolling, Sidelying to Sit Rolling: Contact guard assist Sidelying to sit: Min assist, HOB elevated, Used rails       General bed mobility comments: cues for hand placmeent and precaution compliance during mobility    Transfers Overall transfer level: Needs assistance Equipment used: Rolling walker (2 wheels) Transfers: Sit to/from Stand, Bed to  chair/wheelchair/BSC Sit to Stand: Min assist     Step pivot transfers: Contact guard assist           Balance                                           ADL either performed or assessed with clinical judgement   ADL Overall ADL's : Needs assistance/impaired     Grooming: Wash/dry hands;Supervision/safety;Standing Grooming Details (indicate cue type and reason): standing at sink         Upper Body Dressing : Minimal assistance;Sitting Upper Body Dressing Details (indicate cue type and reason): don cervical collar Lower Body Dressing: Minimal assistance;Sitting/lateral leans   Toilet Transfer: Contact guard assist;Regular Toilet;Rolling walker (2 wheels)   Toileting- Clothing Manipulation and Hygiene: Contact guard assist;Sitting/lateral lean Toileting - Clothing Manipulation Details (indicate cue type and reason): anterior pericare     Functional mobility during ADLs: Contact guard assist;Rolling walker (2 wheels) General ADL Comments: max cues for recall of cervical precuations and implementation during functional mobility and ADL completion    Extremity/Trunk Assessment              Vision       Perception     Praxis     Communication Communication Communication: No apparent difficulties   Cognition Arousal: Alert Behavior During Therapy: Lability Cognition: Cognition impaired       Memory impairment (select all impairments): Short-term memory     OT - Cognition Comments: pt. States my memory is not that good can you say that again.  Asking me to repeat something I has just said or asked. This occurred throughout session.  Pt. With notable delay with command/question response/reply  and sometimes not answering or initiating at all without cont. Prompts.  Pt. Stating she needed the thermostat turned up and had requested it, I showed her and reviewed it was in the highest position so it had been done.  Made sure pt. Was covered with  blankets and stated she was warm enough at end of session.                   Following commands: Impaired Following commands impaired: Follows one step commands with increased time, Follows one step commands inconsistently      Cueing   Cueing Techniques: Verbal cues, Tactile cues, Gestural cues  Exercises      Shoulder Instructions       General Comments      Pertinent Vitals/ Pain       Pain Assessment Pain Assessment: No/denies pain  Home Living                                          Prior Functioning/Environment              Frequency  Min 2X/week        Progress Toward Goals  OT Goals(current goals can now be found in the care plan section)  Progress towards OT goals: Progressing toward goals     Plan      Co-evaluation                 AM-PAC OT 6 Clicks Daily Activity     Outcome Measure   Help from another person eating meals?: A Little Help from another person taking care of personal grooming?: A Little Help from another person toileting, which includes using toliet, bedpan, or urinal?: A Little Help from another person bathing (including washing, rinsing, drying)?: A Lot Help from another person to put on and taking off regular upper body clothing?: A Little Help from another person to put on and taking off regular lower body clothing?: A Lot 6 Click Score: 16    End of Session Equipment Utilized During Treatment: Gait belt;Rolling walker (2 wheels)      Activity Tolerance Patient tolerated treatment well   Patient Left in chair;with call bell/phone within reach;with chair alarm set   Nurse Communication Mobility status;Other (comment) (reviewed session details with RN including snack provided per pt. request)        Time: 9082-9055 OT Time Calculation (min): 27 min  Charges: OT General Charges $OT Visit: 1 Visit OT Treatments $Self Care/Home Management : 23-37 mins  Randall, COTA/L Acute  Rehabilitation [phone]   CHRISTELLA Nest Lorraine-COTA/L  09/30/2023, 10:05 AM

## 2023-09-30 NOTE — Progress Notes (Signed)
    Providing Compassionate, Quality Care - Together   NEUROSURGERY PROGRESS NOTE     S: No issues overnight.    O: EXAM:  BP (!) 119/44 (BP Location: Right Arm)   Pulse 63   Temp 97.8 F (36.6 C)   Resp 18   Ht 5' 7 (1.702 m)   Wt 106.5 kg   SpO2 99%   BMI 36.77 kg/m     Awake, alert, oriented  Speech fluent, appropriate  Strength/sensation grossly intact Incision c/d/i   ASSESSMENT:  73 y.o. s/p PCDF    PLAN: -Awaiting insurance auth for placement -Continue supportive care -Continue therapies as tolerated -Call w/ questions/concerns.   Camie Pickle, [facility]

## 2023-10-01 LAB — GLUCOSE, CAPILLARY
Glucose-Capillary: 162 mg/dL — ABNORMAL HIGH (ref 70–99)
Glucose-Capillary: 200 mg/dL — ABNORMAL HIGH (ref 70–99)
Glucose-Capillary: 200 mg/dL — ABNORMAL HIGH (ref 70–99)
Glucose-Capillary: 136 mg/dL — ABNORMAL HIGH (ref 70–99)
Glucose-Capillary: 159 mg/dL — ABNORMAL HIGH (ref 70–99)

## 2023-10-01 NOTE — Plan of Care (Signed)
  Problem: Clinical Measurements: Goal: Will remain free from infection Outcome: Progressing Goal: Diagnostic test results will improve Outcome: Progressing Goal: Cardiovascular complication will be avoided Outcome: Progressing   

## 2023-10-01 NOTE — Progress Notes (Signed)
    Providing Compassionate, Quality Care - Together   NEUROSURGERY PROGRESS NOTE     S: No issues overnight.    O: EXAM:  BP 130/63   Pulse 63   Temp 98.2 F (36.8 C) (Oral)   Resp 17   Ht 5' 7 (1.702 m)   Wt 106.5 kg   SpO2 99%   BMI 36.77 kg/m     Awake, alert, oriented  Speech fluent, appropriate  Sitting in bedside chair comfortably Strength/sensation grossly intact Incision c/d/i   ASSESSMENT:  73 y.o. s/p PCDF    PLAN: -Awaiting insurance auth for placement. Meds printed and placed in chart.  -Continue supportive care -Continue therapies as tolerated -Call w/ questions/concerns.   Camie Pickle, [facility]

## 2023-10-01 NOTE — Plan of Care (Signed)
  Problem: Pain Managment: Goal: General experience of comfort will improve and/or be controlled Outcome: Progressing   Problem: Safety: Goal: Ability to remain free from injury will improve Outcome: Progressing   Problem: Skin Integrity: Goal: Risk for impaired skin integrity will decrease Outcome: Progressing

## 2023-10-02 ENCOUNTER — Other Ambulatory Visit (HOSPITAL_COMMUNITY): Payer: Self-pay

## 2023-10-02 LAB — GLUCOSE, CAPILLARY
Glucose-Capillary: 167 mg/dL — ABNORMAL HIGH (ref 70–99)
Glucose-Capillary: 186 mg/dL — ABNORMAL HIGH (ref 70–99)

## 2023-10-02 MED ORDER — OXYCODONE-ACETAMINOPHEN 5-325 MG PO TABS: 1.0000 | ORAL_TABLET | ORAL | 0 refills | Status: AC | PRN

## 2023-10-02 NOTE — Discharge Summary (Cosign Needed Addendum)
 Physician Discharge Summary     Providing Compassionate, Quality Care - Together   Patient ID: Carla Stokes MRN: [id number] DOB/AGE: 1950-08-01 73 y.o.  Admit date: 09/21/2023 Discharge date: 10/02/2023  Admission Diagnoses: Cervical spondylosis with myelopathy and radiculopathy  Discharge Diagnoses:  Principal Problem:   Cervical spondylosis with myelopathy and radiculopathy   Discharged Condition: good  Hospital Course: Patient underwent a posterior C2-3, C3-4, C4-5, C5-6, C6-7, and C7-T1 decompression and fusion by Dr. Mavis on 09/21/2023. She was admitted to [facility] following recovery from anesthesia in the PACU. Her postoperative course has been complicated by pain control issues and patient being slow to mobilize. She has worked with both physical and occupational therapies who feel the patient is ready for discharge to a skilled nursing facility for further rehabilitation. She is ambulating with the aid of a walker and contact guard assist. She is tolerating a normal diet. She is not having any bowel or bladder dysfunction. Her pain is reasonably controlled with oral pain medication. She is ready for discharge to [facility].   Consults: PT/OT/TOC  Significant Diagnostic Studies: labs:  Urinalysis, Routine w reflex microscopic -Urine, Clean Catch     Status: Abnormal   Collection Time: 09/27/23 12:55 PM  Result Value Ref Range   Color, Urine YELLOW YELLOW   APPearance HAZY (A) CLEAR   Specific Gravity, Urine 1.013 1.005 - 1.030   pH 6.0 5.0 - 8.0   Glucose, UA NEGATIVE NEGATIVE mg/dL   Hgb urine dipstick NEGATIVE NEGATIVE   Bilirubin Urine NEGATIVE NEGATIVE   Ketones, ur NEGATIVE NEGATIVE mg/dL   Protein, ur NEGATIVE NEGATIVE mg/dL   Nitrite NEGATIVE NEGATIVE   Leukocytes,Ua TRACE (A) NEGATIVE   RBC / HPF 0-5 0 - 5 RBC/hpf   WBC, UA 6-10 0 - 5 WBC/hpf   Bacteria, UA MANY (A) NONE SEEN   Squamous Epithelial / HPF 0-5 0 - 5 /HPF    Comment: Performed at  [facility], [address] [place], [place] [address]  CBC     Status: Abnormal   Collection Time: 09/27/23  2:22 PM  Result Value Ref Range   WBC 5.5 4.0 - 10.5 K/uL   RBC 4.20 3.87 - 5.11 MIL/uL   Hemoglobin 11.4 (L) 12.0 - 15.0 g/dL   HCT 66.4 (L) 63.9 - 53.9 %   MCV 79.8 (L) 80.0 - 100.0 fL   MCH 27.1 26.0 - 34.0 pg   MCHC 34.0 30.0 - 36.0 g/dL   RDW 86.9 88.4 - 84.4 %   Platelets 207 150 - 400 K/uL   nRBC 0.0 0.0 - 0.2 %    Comment: Performed at [facility], [address] [place], [place] [address]  Basic metabolic panel     Status: Abnormal   Collection Time: 09/27/23  2:22 PM  Result Value Ref Range   Sodium 138 135 - 145 mmol/L   Potassium 3.1 (L) 3.5 - 5.1 mmol/L   Chloride 98 98 - 111 mmol/L   CO2 31 22 - 32 mmol/L   Glucose, Bld 198 (H) 70 - 99 mg/dL    Comment: Glucose reference range applies only to samples taken after fasting for at least 8 hours.   BUN 17 8 - 23 mg/dL   Creatinine, Ser 8.90 (H) 0.44 - 1.00 mg/dL   Calcium  8.8 (L) 8.9 - 10.3 mg/dL   GFR, Estimated 55 (L) >60 mL/min    Comment: (NOTE) Calculated using the CKD-EPI Creatinine Equation (2021)  Anion gap 9 5 - 15    Comment: Performed at [facility], [address], [place], [place] [address]   and radiology: DG Cervical Spine 1 View Result Date: 09/21/2023 CLINICAL DATA:  Elective surgery. EXAM: DG CERVICAL SPINE - 1 VIEW COMPARISON:  Preoperative MRI 08/04/2023 FINDINGS: Single portable cross-table lateral view of the cervical spine submitted from the operating room. Surgical instrument localizes posteriorly at the C2-C3 level, with additional instrumentation posterior to the mid cervical spine. IMPRESSION: Intraoperative localization during cervical spine surgery. Electronically Signed   By: Andrea Gasman M.D.   On: 09/21/2023 12:47   DG O-ARM IMAGE ONLY/NO REPORT Result Date: 09/21/2023 There is no Radiologist interpretation  for this exam.  MR Cervical Spine Wo  Contrast Result Date: 08/04/2023 CLINICAL DATA:  Acute neurologic deficit EXAM: MRI HEAD WITHOUT CONTRAST MRI CERVICAL SPINE WITHOUT CONTRAST TECHNIQUE: Multiplanar, multiecho pulse sequences of the brain and surrounding structures, and cervical spine, to include the craniocervical junction and cervicothoracic junction, were obtained without intravenous contrast. COMPARISON:  CTA head neck 08/04/2023 FINDINGS: MRI HEAD FINDINGS Brain: There is hyperintensity on diffusion-weighted imaging within the right centrum semiovale. No associated ADC correlate. Chronic microhemorrhage in the medial left temporal lobe. There is multifocal hyperintense T2-weighted signal within the white matter. Parenchymal volume and CSF spaces are normal. The midline structures are normal. Vascular: Normal flow voids. Skull and upper cervical spine: Normal calvarium and skull base. Visualized upper cervical spine and soft tissues are normal. Sinuses/Orbits:No paranasal sinus fluid levels or advanced mucosal thickening. No mastoid or middle ear effusion. Normal orbits. MRI CERVICAL SPINE FINDINGS Alignment: Grade 1 retrolisthesis at C5-6 Vertebrae: No acute abnormality. There is flowing anterior osteophytosis at C4-6. Cord: Normal signal and morphology. Posterior Fossa, vertebral arteries, paraspinal tissues: Negative. Disc levels: C1-2: Unremarkable. 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 Severe spinal canal stenosis with compression of the spinal cord. No neural foraminal stenosis. C3-4: Ossification of the posterior longitudinal ligament. Severe spinal canal stenosis. There is compression of the spinal cord. Severe bilateral neural foraminal stenosis. C4-5: Ossification of posterior longitudinal ligament. Severe spinal canal stenosis. There is compression of the spinal cord. Severe bilateral neural foraminal stenosis. C5-6: Ossification of the posterior longitudinal ligament. Severe spinal canal stenosis. Spinal cord  compression. Severe bilateral neural foraminal stenosis. C6-7: Small disc bulge. Moderate spinal canal stenosis. Severe left neural foraminal stenosis. C7-T1: Intermediate sized central disc protrusion. There is no spinal canal stenosis. No neural foraminal stenosis. IMPRESSION: 1. 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 No acute intracranial abnormality. 2. Severe spinal canal stenosis with compression of the spinal cord at C2-3, C3-4, C4-5 and C5-6. 3. Severe bilateral neural foraminal stenosis at C3-4, C4-5 and C5-6. 4. Severe left C6-7 neural foraminal stenosis. Electronically Signed   By: Franky Stanford M.D.   On: 08/04/2023 21:48   CT ANGIO HEAD NECK W WO CM Result Date: 08/04/2023 CLINICAL DATA:  Right-sided neck pain, pain radiating to back causing difficulty sleeping, balance difficulty. EXAM: CT ANGIOGRAPHY HEAD AND NECK WITH AND WITHOUT CONTRAST TECHNIQUE: Multidetector CT imaging of the head and neck was performed using the standard protocol during bolus administration of intravenous contrast. Multiplanar CT image reconstructions and MIPs were obtained to evaluate the vascular anatomy. Carotid stenosis measurements (when applicable) are obtained utilizing NASCET criteria, using the distal internal carotid diameter as the denominator. RADIATION DOSE REDUCTION: This exam was performed according to the departmental dose-optimization program which includes automated exposure control, adjustment of the mA and/or  kV according to patient size and/or use of iterative reconstruction technique. CONTRAST:  75mL OMNIPAQUE  IOHEXOL  350 MG/ML SOLN COMPARISON:  None Available. FINDINGS: CT HEAD FINDINGS Brain: No acute intracranial hemorrhage. No CT evidence of acute infarct. No edema, mass effect, or midline shift. The basilar cisterns are patent. Ventricles: Ventricles are normal in size and configuration. Vascular: No hyperdense vessel. Intracranial atherosclerotic calcifications noted. Skull: No acute  or aggressive finding. Sinuses/orbits: Left lens replacement. Orbits otherwise unremarkable. Mucous retention cyst in the partially visualized left maxillary sinus. Other: Mastoid air cells are clear. CTA NECK FINDINGS Aortic arch: Common origin of the brachiocephalic and left common carotid arteries. Imaged portion shows no evidence of aneurysm or dissection. No significant stenosis of the major arch vessel origins. Pulmonary arteries: As permitted by contrast timing, there are no filling defects in the visualized pulmonary arteries. Subclavian arteries: Patent bilaterally. Mild atherosclerosis at the origin of the brachiocephalic artery and along the proximal right subclavian without significant stenosis. Additional mild atherosclerosis at the origin of the left subclavian artery without significant stenosis. Right carotid system: No evidence of dissection, stenosis (50% or greater), or occlusion. Minimal calcified atherosclerosis at the carotid bifurcation. Left carotid system: No evidence of dissection, stenosis (50% or greater), or occlusion. Minimal calcified atherosclerosis at the carotid bifurcation. Vertebral arteries: Codominant. Patent from the origins to the vertebrobasilar confluence. No evidence of occlusion or dissection. Atherosclerosis involving the left vertebral artery at the V3/V4 junction extending into the proximal V4 segment resulting in mild stenosis. Skeleton: No acute findings. Degenerative changes in the cervical spine. 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 There is significant spinal canal stenosis appreciated at C2-3, C3-4, C4-5, and C5-6. There is additional ossification of the posterior longitudinal ligament at C7-T1 with at least mild spinal canal stenosis. Facet arthrosis at multiple levels in the cervical spine there is significant foraminal narrowing at multiple levels particularly at C3-4 and C4-5. Other  neck: The visualized airway is patent. No cervical lymphadenopathy. Large right thyroid  nodule measuring at least 3.3 cm in diameter. Upper chest: Visualized lung apices are clear. Review of the MIP images confirms the above findings CTA HEAD FINDINGS ANTERIOR CIRCULATION: The intracranial ICAs are patent bilaterally. Mild atherosclerosis of the bilateral carotid siphons. Mild stenosis of the bilateral cavernous ICAs. Additional moderate stenosis of the left supraclinoid ICA. No proximal occlusion, aneurysm, or vascular malformation. MCAs: The middle cerebral arteries are patent bilaterally. ACAs: The anterior cerebral arteries are patent bilaterally. POSTERIOR CIRCULATION: No significant stenosis, proximal occlusion, aneurysm, or vascular malformation. PCAs: The posterior cerebral arteries are patent bilaterally. Pcomm: Not well visualized SCAs: The superior cerebellar arteries are patent bilaterally. Basilar artery: Patent AICAs: Patent PICAs: Patent Vertebral arteries: As above. Venous sinuses: As permitted by contrast timing, patent. Anatomic variants: None Review of the MIP images confirms the above findings IMPRESSION: No large vessel occlusion. No CT evidence of acute intracranial abnormality. No significant atherosclerosis of the arteries in the neck. 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 Atherosclerosis involving the left vertebral artery at the V3/V4 junction and proximal V4 segment resulting in mild stenosis. Significant degenerative changes of the cervical spine along with ossification of the posterior longitudinal ligament. Significant osseous spinal canal narrowing at multiple levels. Additional significant osseous foraminal narrowing. Recommend correlation with MRI cervical spine. Right thyroid  nodule measuring up to 3.3 cm. Recommend nonemergent correlation with  thyroid  ultrasound if not previously  performed. Electronically Signed   By: Donnice Mania M.D.   On: 08/04/2023 17:58     Treatments: surgery: C2-3, C3-4, C4-5, C5-6, C6-7, and C7-T1 decompressive laminectomy; C2-3, C3-4, C4-5, C5-6, C6-7 and C7-T1 posterior lateral arthrodesis with local morselized autograft bone and intra grow bone graft extender; posterior segmental instrumentation C2-T1 bilaterally with Zimmer titanium screws and rods; application of Stealth spinal neuronavigation.   Discharge Exam: Blood pressure 131/66, pulse (!) 58, temperature 98.1 F (36.7 C), temperature source Oral, resp. rate 18, height 5' 7 (1.702 m), weight 106.5 kg, SpO2 97%.  Alert and oriented x 4, tearful PERRLA CN II-XII grossly intact MAE, Strength and sensation intact Incision is open to air. It is clean, dry, and intact.   Disposition: Discharge disposition: 03-Skilled Nursing Facility       Discharge Instructions     Call MD for:  difficulty breathing, headache or visual disturbances   Complete by: As directed    Call MD for:  hives   Complete by: As directed    Call MD for:  persistant nausea and vomiting   Complete by: As directed    Call MD for:  redness, tenderness, or signs of infection (pain, swelling, redness, odor or green/yellow discharge around incision site)   Complete by: As directed    Call MD for:  severe uncontrolled pain   Complete by: As directed    Diet - low sodium heart healthy   Complete by: As directed    Increase activity slowly   Complete by: As directed    No dressing needed   Complete by: As directed    No wound care   Complete by: As directed       Allergies as of 10/02/2023       Reactions   Latex Itching   Codeine Other (See Comments)   Dizziness (intolerance)   Dapagliflozin Other (See Comments)   2017: yeast infection - known side effect         Medication List     TAKE these medications    acetaminophen  500 MG tablet Commonly known as: TYLENOL  Take 500-1,000 mg by  mouth every 6 (six) hours as needed for moderate pain.   amLODipine  5 MG tablet Commonly known as: NORVASC  Take 5 mg by mouth daily.   aspirin  EC 81 MG tablet Take 81 mg by mouth daily. Swallow whole.   cyclobenzaprine  5 MG tablet Commonly known as: FLEXERIL  Take 5 mg by mouth 3 (three) times daily as needed for muscle spasms.   gabapentin  100 MG capsule Commonly known as: NEURONTIN  Take 2 capsules (200 mg total) by mouth 3 (three) times daily.   lidocaine  4 % Place 1 patch onto the skin daily. Remove & Discard patch within 12 hours or as directed by MD   multivitamin with minerals Tabs tablet Take 1 tablet by mouth daily.   oxyCODONE -acetaminophen  5-325 MG tablet Commonly known as: PERCOCET/ROXICET Take 1-2 tablets by mouth every 4 (four) hours as needed for moderate pain (pain score 4-6). What changed:  how much to take reasons to take this   Ozempic (2 MG/DOSE) 8 MG/3ML Sopn Generic drug: Semaglutide (2 MG/DOSE) Inject 2 mg into the skin every Wednesday.   rosuvastatin  20 MG tablet Commonly known as: CRESTOR  Take 20 mg by mouth daily.   Toujeo  SoloStar 300 UNIT/ML Solostar Pen Generic drug: insulin  glargine (1 Unit Dial ) Inject 80 Units into the skin at bedtime.   valsartan -hydrochlorothiazide  320-25  MG tablet Commonly known as: DIOVAN -HCT Take 1 tablet by mouth every morning.   zinc  gluconate 50 MG tablet Take 50 mg by mouth daily.               Discharge Care Instructions  (From admission, onward)           Start     Ordered   10/02/23 0000  No dressing needed        10/02/23 1117            Contact information for follow-up providers     Mavis Purchase, MD. Go on 10/10/2023.   Specialty: Neurosurgery Why: First post op appointment with x-rays is on 10/10/2023 at 8:30 AM. Contact information: [address] [place] [address] [phone]              Contact information for after-discharge care      Destination     [facility], [place] .   Service: Skilled Nursing Contact information: [address] [place]  [phone]                     Signed: Gerard Beck, DNP, AGNP-C Nurse Practitioner  [facility] [address], [address], [place], [place] [address] P: [phone]    F: [phone]  10/02/2023, 11:20 AM

## 2023-10-02 NOTE — Progress Notes (Signed)
 [facility] was called by the RN, and report was given to the patients' nurse, Chelsey, who will be resuming nursing care at the facility. Discharge instructions were printed and placed with the patient's chart to accompany with them when transport arrives to take them to [facility].   Patient IV access was removed by Artist, RN Engineer, manufacturing systems) and documented in the patient chart.  Leonor FORBES Clarks, BSN, RN 10/02/23 12:09 PM

## 2023-10-02 NOTE — TOC Progression Note (Addendum)
 Transition of Care [facility]) - Progression Note    Patient Details  Name: Carla Stokes MRN: [id number] Date of Birth: [birth year]  Transition of Care [facility]) CM/SW Contact  Bridget Cordella Simmonds, LCSW Phone Number: 10/02/2023, 8:43 AM  Clinical Narrative:   SNF auth approved: 7 days. Admit Date 8/1 LCD 03/01/72 hulan [id number]   CSW confirmed with [facility] that they can receive pt today.    Message sent to MD via chat.  1040: CSW called [facility], office will notify PA, who will handle DC.  Requested hard scripts for any pain meds.   1125: Message from PT: pt does not require ambulance transport to SNF. CSW spoke with pt regarding transportation--pt will call her daughter and son and said one of them will be able to transport.    Expected Discharge Plan: Skilled Nursing Facility Barriers to Discharge: Insurance Authorization               Expected Discharge Plan and Services In-house Referral: Clinical Social Work   Post Acute Care Choice: Skilled Nursing Facility Living arrangements for the past 2 months: Single Family Home                                       Social Drivers of Health (SDOH) Interventions SDOH Screenings   Food Insecurity: No Food Insecurity (09/21/2023)  Housing: Low Risk  (09/21/2023)  Transportation Needs: No Transportation Needs (09/21/2023)  Utilities: Not At Risk (09/21/2023)  Depression (PHQ2-9): Low Risk  (05/18/2022)  Social Connections: Moderately Integrated (09/21/2023)  Tobacco Use: Low Risk  (09/21/2023)    Readmission Risk Interventions     No data to display

## 2023-10-02 NOTE — Plan of Care (Signed)

## 2023-10-02 NOTE — Plan of Care (Signed)
 Problem: Education: Goal: Knowledge of General Education information will improve Description: Including pain rating scale, medication(s)/side effects and non-pharmacologic comfort measures 10/02/2023 1147 by Terance Leonor BRAVO, RN Outcome: Adequate for Discharge 10/02/2023 1146 by Terance Leonor BRAVO, RN Outcome: Progressing   Problem: Health Behavior/Discharge Planning: Goal: Ability to manage health-related needs will improve 10/02/2023 1147 by Terance Leonor BRAVO, RN Outcome: Adequate for Discharge 10/02/2023 1146 by Terance Leonor BRAVO, RN Outcome: Progressing   Problem: Clinical Measurements: Goal: Ability to maintain clinical measurements within normal limits will improve 10/02/2023 1147 by Terance Leonor BRAVO, RN Outcome: Adequate for Discharge 10/02/2023 1146 by Terance Leonor BRAVO, RN Outcome: Progressing Goal: Will remain free from infection 10/02/2023 1147 by Terance Leonor BRAVO, RN Outcome: Adequate for Discharge 10/02/2023 1146 by Terance Leonor BRAVO, RN Outcome: Progressing Goal: Diagnostic test results will improve 10/02/2023 1147 by Terance Leonor BRAVO, RN Outcome: Adequate for Discharge 10/02/2023 1146 by Terance Leonor BRAVO, RN Outcome: Progressing Goal: Respiratory complications will improve 10/02/2023 1147 by Terance Leonor BRAVO, RN Outcome: Adequate for Discharge 10/02/2023 1146 by Terance Leonor BRAVO, RN Outcome: Progressing Goal: Cardiovascular complication will be avoided 10/02/2023 1147 by Terance Leonor BRAVO, RN Outcome: Adequate for Discharge 10/02/2023 1146 by Terance Leonor BRAVO, RN Outcome: Progressing   Problem: Activity: Goal: Risk for activity intolerance will decrease 10/02/2023 1147 by Terance Leonor BRAVO, RN Outcome: Adequate for Discharge 10/02/2023 1146 by Terance Leonor BRAVO, RN Outcome: Progressing   Problem: Nutrition: Goal: Adequate nutrition will be maintained 10/02/2023 1147 by Terance Leonor BRAVO, RN Outcome: Adequate for Discharge 10/02/2023 1146 by  Terance Leonor BRAVO, RN Outcome: Progressing   Problem: Coping: Goal: Level of anxiety will decrease 10/02/2023 1147 by Terance Leonor BRAVO, RN Outcome: Adequate for Discharge 10/02/2023 1146 by Terance Leonor BRAVO, RN Outcome: Progressing   Problem: Elimination: Goal: Will not experience complications related to bowel motility 10/02/2023 1147 by Terance Leonor BRAVO, RN Outcome: Adequate for Discharge 10/02/2023 1146 by Terance Leonor BRAVO, RN Outcome: Progressing Goal: Will not experience complications related to urinary retention 10/02/2023 1147 by Terance Leonor BRAVO, RN Outcome: Adequate for Discharge 10/02/2023 1146 by Terance Leonor BRAVO, RN Outcome: Progressing   Problem: Pain Managment: Goal: General experience of comfort will improve and/or be controlled 10/02/2023 1147 by Terance Leonor BRAVO, RN Outcome: Adequate for Discharge 10/02/2023 1146 by Terance Leonor BRAVO, RN Outcome: Progressing   Problem: Safety: Goal: Ability to remain free from injury will improve 10/02/2023 1147 by Terance Leonor BRAVO, RN Outcome: Adequate for Discharge 10/02/2023 1146 by Terance Leonor BRAVO, RN Outcome: Progressing   Problem: Skin Integrity: Goal: Risk for impaired skin integrity will decrease 10/02/2023 1147 by Terance Leonor BRAVO, RN Outcome: Adequate for Discharge 10/02/2023 1146 by Terance Leonor BRAVO, RN Outcome: Progressing   Problem: Education: Goal: Ability to describe self-care measures that may prevent or decrease complications (Diabetes Survival Skills Education) will improve 10/02/2023 1147 by Terance Leonor BRAVO, RN Outcome: Adequate for Discharge 10/02/2023 1146 by Terance Leonor BRAVO, RN Outcome: Progressing Goal: Individualized Educational Video(s) 10/02/2023 1147 by Terance Leonor BRAVO, RN Outcome: Adequate for Discharge 10/02/2023 1146 by Terance Leonor BRAVO, RN Outcome: Progressing   Problem: Coping: Goal: Ability to adjust to condition or change in health will improve 10/02/2023  1147 by Terance Leonor BRAVO, RN Outcome: Adequate for Discharge 10/02/2023 1146 by Terance Leonor BRAVO, RN Outcome: Progressing   Problem: Fluid Volume: Goal: Ability to maintain a balanced intake and output will improve 10/02/2023 1147 by Terance Leonor BRAVO, RN Outcome: Adequate for Discharge 10/02/2023  1146 by Terance Leonor BRAVO, RN Outcome: Progressing   Problem: Health Behavior/Discharge Planning: Goal: Ability to identify and utilize available resources and services will improve 10/02/2023 1147 by Terance Leonor BRAVO, RN Outcome: Adequate for Discharge 10/02/2023 1146 by Terance Leonor BRAVO, RN Outcome: Progressing Goal: Ability to manage health-related needs will improve 10/02/2023 1147 by Terance Leonor BRAVO, RN Outcome: Adequate for Discharge 10/02/2023 1146 by Terance Leonor BRAVO, RN Outcome: Progressing   Problem: Metabolic: Goal: Ability to maintain appropriate glucose levels will improve 10/02/2023 1147 by Terance Leonor BRAVO, RN Outcome: Adequate for Discharge 10/02/2023 1146 by Terance Leonor BRAVO, RN Outcome: Progressing   Problem: Nutritional: Goal: Maintenance of adequate nutrition will improve 10/02/2023 1147 by Terance Leonor BRAVO, RN Outcome: Adequate for Discharge 10/02/2023 1146 by Terance Leonor BRAVO, RN Outcome: Progressing Goal: Progress toward achieving an optimal weight will improve 10/02/2023 1147 by Terance Leonor BRAVO, RN Outcome: Adequate for Discharge 10/02/2023 1146 by Terance Leonor BRAVO, RN Outcome: Progressing   Problem: Skin Integrity: Goal: Risk for impaired skin integrity will decrease 10/02/2023 1147 by Terance Leonor BRAVO, RN Outcome: Adequate for Discharge 10/02/2023 1146 by Terance Leonor BRAVO, RN Outcome: Progressing   Problem: Tissue Perfusion: Goal: Adequacy of tissue perfusion will improve 10/02/2023 1147 by Terance Leonor BRAVO, RN Outcome: Adequate for Discharge 10/02/2023 1146 by Terance Leonor BRAVO, RN Outcome: Progressing   Problem:  Education: Goal: Ability to verbalize activity precautions or restrictions will improve 10/02/2023 1147 by Terance Leonor BRAVO, RN Outcome: Adequate for Discharge 10/02/2023 1146 by Terance Leonor BRAVO, RN Outcome: Progressing Goal: Knowledge of the prescribed therapeutic regimen will improve 10/02/2023 1147 by Terance Leonor BRAVO, RN Outcome: Adequate for Discharge 10/02/2023 1146 by Terance Leonor BRAVO, RN Outcome: Progressing Goal: Understanding of discharge needs will improve 10/02/2023 1147 by Terance Leonor BRAVO, RN Outcome: Adequate for Discharge 10/02/2023 1146 by Terance Leonor BRAVO, RN Outcome: Progressing   Problem: Activity: Goal: Ability to avoid complications of mobility impairment will improve 10/02/2023 1147 by Terance Leonor BRAVO, RN Outcome: Adequate for Discharge 10/02/2023 1146 by Terance Leonor BRAVO, RN Outcome: Progressing Goal: Ability to tolerate increased activity will improve 10/02/2023 1147 by Terance Leonor BRAVO, RN Outcome: Adequate for Discharge 10/02/2023 1146 by Terance Leonor BRAVO, RN Outcome: Progressing Goal: Will remain free from falls 10/02/2023 1147 by Terance Leonor BRAVO, RN Outcome: Adequate for Discharge 10/02/2023 1146 by Terance Leonor BRAVO, RN Outcome: Progressing   Problem: Bowel/Gastric: Goal: Gastrointestinal status for postoperative course will improve 10/02/2023 1147 by Terance Leonor BRAVO, RN Outcome: Adequate for Discharge 10/02/2023 1146 by Terance Leonor BRAVO, RN Outcome: Progressing   Problem: Clinical Measurements: Goal: Ability to maintain clinical measurements within normal limits will improve 10/02/2023 1147 by Terance Leonor BRAVO, RN Outcome: Adequate for Discharge 10/02/2023 1146 by Terance Leonor BRAVO, RN Outcome: Progressing Goal: Postoperative complications will be avoided or minimized 10/02/2023 1147 by Terance Leonor BRAVO, RN Outcome: Adequate for Discharge 10/02/2023 1146 by Terance Leonor BRAVO, RN Outcome: Progressing Goal: Diagnostic  test results will improve 10/02/2023 1147 by Terance Leonor BRAVO, RN Outcome: Adequate for Discharge 10/02/2023 1146 by Terance Leonor BRAVO, RN Outcome: Progressing   Problem: Pain Management: Goal: Pain level will decrease 10/02/2023 1147 by Terance Leonor BRAVO, RN Outcome: Adequate for Discharge 10/02/2023 1146 by Terance Leonor BRAVO, RN Outcome: Progressing   Problem: Skin Integrity: Goal: Will show signs of wound healing 10/02/2023 1147 by Terance Leonor BRAVO, RN Outcome: Adequate for Discharge 10/02/2023 1146 by Terance Leonor BRAVO, RN Outcome: Progressing   Problem: Health  Behavior/Discharge Planning: Goal: Identification of resources available to assist in meeting health care needs will improve 10/02/2023 1147 by Terance Leonor BRAVO, RN Outcome: Adequate for Discharge 10/02/2023 1146 by Terance Leonor BRAVO, RN Outcome: Progressing   Problem: Bladder/Genitourinary: Goal: Urinary functional status for postoperative course will improve 10/02/2023 1147 by Terance Leonor BRAVO, RN Outcome: Adequate for Discharge 10/02/2023 1146 by Terance Leonor BRAVO, RN Outcome: Progressing

## 2023-10-02 NOTE — TOC Transition Note (Addendum)
 Transition of Care [facility]) - Discharge Note   Patient Details  Name: Carla Stokes MRN: [id number] Date of Birth: 12-31-1950  [facility] CM/SW Contact:  Lendia Dais, LCSW Phone Number: 10/02/2023, 11:56 AM   Clinical Narrative: Pt will dc by private vehicle to [facility], bed 204.   RN report to [phone].  Pt will need to  be brought down to [place] [facility] entrance and will need assistance.  Final next level of care: Skilled Nursing Facility Barriers to Discharge: Barriers Resolved   Patient Goals and CMS Choice Patient states their goals for this hospitalization and ongoing recovery are:: SNF          Discharge Placement              Patient chooses bed at: [facility] [place] Patient to be transferred to facility by: private vehicle Name of family member notified: STALEY,ERICA Patient and family notified of of transfer: 10/02/23  Discharge Plan and Services Additional resources added to the After Visit Summary for   In-house Referral: Clinical Social Work   Post Acute Care Choice: Skilled Nursing Facility                               Social Drivers of Health (SDOH) Interventions SDOH Screenings   Food Insecurity: No Food Insecurity (09/21/2023)  Housing: Low Risk  (09/21/2023)  Transportation Needs: No Transportation Needs (09/21/2023)  Utilities: Not At Risk (09/21/2023)  Depression (PHQ2-9): Low Risk  (05/18/2022)  Social Connections: Moderately Integrated (09/21/2023)  Tobacco Use: Low Risk  (09/21/2023)     Readmission Risk Interventions     No data to display

## 2023-10-02 NOTE — Discharge Instructions (Signed)
 Wound Care Keep incision covered and dry until post op day 3. You may remove the Honeycomb dressing on post op day 3. Do not put any creams, lotions, or ointments on incision. You are fine to shower. Let water run over incision and pat dry.  Activity Walk each and every day, increasing distance each day. No lifting greater than 5 lbs.  Avoid excessive neck motion. No driving for 2 weeks; may ride as a passenger locally.  Diet Resume your normal diet.   Return to Work Will be discussed at your follow up appointment.  Call Your Doctor If Any of These Occur Redness, drainage, or swelling at the wound.  Temperature greater than 101 degrees. Severe pain not relieved by pain medication. Incision starts to come apart.  Follow Up Appt Call [phone] today for appointment in 2-3 weeks if you don't already have one or for any problems.  If you have any hardware placed in your spine, you will need an x-ray before your appointment.

## 2023-10-02 NOTE — Plan of Care (Signed)
   Problem: Elimination: Goal: Will not experience complications related to bowel motility Outcome: Progressing   Problem: Pain Managment: Goal: General experience of comfort will improve and/or be controlled Outcome: Progressing   Problem: Safety: Goal: Ability to remain free from injury will improve Outcome: Progressing

## 2023-12-26 ENCOUNTER — Telehealth: Payer: Self-pay | Admitting: Podiatry

## 2023-12-26 NOTE — Telephone Encounter (Signed)
 Patient would like a referral for diabetic shoes.

## 2024-05-14 ENCOUNTER — Ambulatory Visit: Admitting: Adult Health
